# Patient Record
Sex: Female | Born: 1935 | Race: Black or African American | Hispanic: No | Smoking: Former smoker
Health system: Southern US, Community
[De-identification: ages and names within clinical notes are randomized; demographics above are authoritative.]

## PROBLEM LIST (undated history)

## (undated) DIAGNOSIS — F32A Depression, unspecified: Secondary | ICD-10-CM

## (undated) DIAGNOSIS — I69391 Dysphagia following cerebral infarction: Secondary | ICD-10-CM

## (undated) DIAGNOSIS — N189 Chronic kidney disease, unspecified: Secondary | ICD-10-CM

## (undated) DIAGNOSIS — M87059 Idiopathic aseptic necrosis of unspecified femur: Secondary | ICD-10-CM

## (undated) DIAGNOSIS — I10 Essential (primary) hypertension: Secondary | ICD-10-CM

## (undated) DIAGNOSIS — Z5189 Encounter for other specified aftercare: Secondary | ICD-10-CM

## (undated) DIAGNOSIS — I2510 Atherosclerotic heart disease of native coronary artery without angina pectoris: Secondary | ICD-10-CM

## (undated) DIAGNOSIS — Z950 Presence of cardiac pacemaker: Secondary | ICD-10-CM

## (undated) DIAGNOSIS — Z8679 Personal history of other diseases of the circulatory system: Secondary | ICD-10-CM

## (undated) DIAGNOSIS — K219 Gastro-esophageal reflux disease without esophagitis: Secondary | ICD-10-CM

## (undated) DIAGNOSIS — I517 Cardiomegaly: Secondary | ICD-10-CM

## (undated) DIAGNOSIS — K754 Autoimmune hepatitis: Secondary | ICD-10-CM

## (undated) DIAGNOSIS — Z8709 Personal history of other diseases of the respiratory system: Secondary | ICD-10-CM

## (undated) DIAGNOSIS — J90 Pleural effusion, not elsewhere classified: Secondary | ICD-10-CM

## (undated) DIAGNOSIS — Z8719 Personal history of other diseases of the digestive system: Secondary | ICD-10-CM

## (undated) DIAGNOSIS — J159 Unspecified bacterial pneumonia: Secondary | ICD-10-CM

## (undated) DIAGNOSIS — J449 Chronic obstructive pulmonary disease, unspecified: Secondary | ICD-10-CM

## (undated) DIAGNOSIS — F419 Anxiety disorder, unspecified: Secondary | ICD-10-CM

## (undated) DIAGNOSIS — I4891 Unspecified atrial fibrillation: Principal | ICD-10-CM

## (undated) DIAGNOSIS — M1990 Unspecified osteoarthritis, unspecified site: Secondary | ICD-10-CM

## (undated) DIAGNOSIS — I209 Angina pectoris, unspecified: Secondary | ICD-10-CM

## (undated) DIAGNOSIS — I509 Heart failure, unspecified: Secondary | ICD-10-CM

## (undated) DIAGNOSIS — D649 Anemia, unspecified: Secondary | ICD-10-CM

## (undated) DIAGNOSIS — I639 Cerebral infarction, unspecified: Secondary | ICD-10-CM

## (undated) DIAGNOSIS — IMO0001 Reserved for inherently not codable concepts without codable children: Secondary | ICD-10-CM

## (undated) DIAGNOSIS — Z9911 Dependence on respirator [ventilator] status: Secondary | ICD-10-CM

## (undated) DIAGNOSIS — F329 Major depressive disorder, single episode, unspecified: Secondary | ICD-10-CM

## (undated) DIAGNOSIS — R0602 Shortness of breath: Secondary | ICD-10-CM

## (undated) DIAGNOSIS — N281 Cyst of kidney, acquired: Secondary | ICD-10-CM

## (undated) DIAGNOSIS — Z9889 Other specified postprocedural states: Secondary | ICD-10-CM

## (undated) DIAGNOSIS — I272 Pulmonary hypertension, unspecified: Secondary | ICD-10-CM

## (undated) DIAGNOSIS — E039 Hypothyroidism, unspecified: Secondary | ICD-10-CM

## (undated) DIAGNOSIS — E785 Hyperlipidemia, unspecified: Secondary | ICD-10-CM

## (undated) HISTORY — DX: Unspecified atrial fibrillation: I48.91

## (undated) HISTORY — PX: CARDIAC CATHETERIZATION: SHX172

## (undated) HISTORY — DX: Other specified postprocedural states: Z98.890

## (undated) HISTORY — PX: OTHER SURGICAL HISTORY: SHX169

## (undated) HISTORY — PX: AV NODE ABLATION: SHX1209

---

## 1959-06-15 HISTORY — PX: ABDOMINAL HYSTERECTOMY: SHX81

## 1998-10-13 ENCOUNTER — Ambulatory Visit (HOSPITAL_COMMUNITY): Admission: RE | Admit: 1998-10-13 | Discharge: 1998-10-13 | Payer: Self-pay | Admitting: Emergency Medicine

## 1998-10-13 ENCOUNTER — Encounter: Payer: Self-pay | Admitting: Emergency Medicine

## 1998-10-24 ENCOUNTER — Inpatient Hospital Stay (HOSPITAL_COMMUNITY): Admission: AD | Admit: 1998-10-24 | Discharge: 1998-10-27 | Payer: Self-pay | Admitting: *Deleted

## 1998-10-25 ENCOUNTER — Encounter: Payer: Self-pay | Admitting: *Deleted

## 1998-11-16 ENCOUNTER — Ambulatory Visit (HOSPITAL_COMMUNITY): Admission: RE | Admit: 1998-11-16 | Discharge: 1998-11-16 | Payer: Self-pay | Admitting: *Deleted

## 1998-12-07 ENCOUNTER — Encounter: Payer: Self-pay | Admitting: Cardiovascular Disease

## 1998-12-07 ENCOUNTER — Inpatient Hospital Stay (HOSPITAL_COMMUNITY): Admission: EM | Admit: 1998-12-07 | Discharge: 1998-12-18 | Payer: Self-pay | Admitting: Emergency Medicine

## 1999-03-13 ENCOUNTER — Encounter: Payer: Self-pay | Admitting: Emergency Medicine

## 1999-03-13 ENCOUNTER — Ambulatory Visit (HOSPITAL_COMMUNITY): Admission: RE | Admit: 1999-03-13 | Discharge: 1999-03-13 | Payer: Self-pay | Admitting: Emergency Medicine

## 1999-07-12 ENCOUNTER — Inpatient Hospital Stay (HOSPITAL_COMMUNITY): Admission: EM | Admit: 1999-07-12 | Discharge: 1999-07-14 | Payer: Self-pay | Admitting: Emergency Medicine

## 1999-07-12 ENCOUNTER — Encounter: Payer: Self-pay | Admitting: Cardiovascular Disease

## 1999-07-12 DIAGNOSIS — K754 Autoimmune hepatitis: Secondary | ICD-10-CM

## 1999-07-12 HISTORY — DX: Autoimmune hepatitis: K75.4

## 1999-07-13 ENCOUNTER — Encounter: Payer: Self-pay | Admitting: Cardiovascular Disease

## 1999-08-09 ENCOUNTER — Inpatient Hospital Stay (HOSPITAL_COMMUNITY): Admission: EM | Admit: 1999-08-09 | Discharge: 1999-09-11 | Payer: Self-pay | Admitting: Emergency Medicine

## 1999-08-13 ENCOUNTER — Encounter: Payer: Self-pay | Admitting: Cardiovascular Disease

## 1999-08-19 ENCOUNTER — Encounter: Payer: Self-pay | Admitting: Cardiothoracic Surgery

## 1999-08-20 ENCOUNTER — Encounter: Payer: Self-pay | Admitting: Cardiothoracic Surgery

## 1999-08-20 ENCOUNTER — Encounter: Payer: Self-pay | Admitting: Thoracic Surgery (Cardiothoracic Vascular Surgery)

## 1999-08-20 HISTORY — PX: CARDIAC VALVE REPLACEMENT: SHX585

## 1999-08-21 ENCOUNTER — Encounter: Payer: Self-pay | Admitting: Cardiothoracic Surgery

## 1999-08-22 ENCOUNTER — Encounter: Payer: Self-pay | Admitting: Cardiothoracic Surgery

## 1999-08-23 ENCOUNTER — Encounter: Payer: Self-pay | Admitting: Cardiothoracic Surgery

## 1999-08-24 ENCOUNTER — Encounter: Payer: Self-pay | Admitting: General Surgery

## 1999-08-25 ENCOUNTER — Encounter: Payer: Self-pay | Admitting: Cardiothoracic Surgery

## 1999-08-26 ENCOUNTER — Encounter: Payer: Self-pay | Admitting: Cardiothoracic Surgery

## 1999-08-27 ENCOUNTER — Encounter: Payer: Self-pay | Admitting: Cardiothoracic Surgery

## 1999-08-28 ENCOUNTER — Encounter: Payer: Self-pay | Admitting: Cardiothoracic Surgery

## 1999-08-29 ENCOUNTER — Encounter: Payer: Self-pay | Admitting: Cardiothoracic Surgery

## 1999-08-30 ENCOUNTER — Encounter: Payer: Self-pay | Admitting: Cardiothoracic Surgery

## 1999-08-31 ENCOUNTER — Encounter: Payer: Self-pay | Admitting: Cardiothoracic Surgery

## 2000-03-13 ENCOUNTER — Ambulatory Visit (HOSPITAL_COMMUNITY): Admission: RE | Admit: 2000-03-13 | Discharge: 2000-03-13 | Payer: Self-pay | Admitting: Emergency Medicine

## 2000-03-13 ENCOUNTER — Encounter: Payer: Self-pay | Admitting: Emergency Medicine

## 2000-09-04 ENCOUNTER — Encounter: Payer: Self-pay | Admitting: Emergency Medicine

## 2000-09-04 ENCOUNTER — Inpatient Hospital Stay (HOSPITAL_COMMUNITY): Admission: EM | Admit: 2000-09-04 | Discharge: 2000-09-20 | Payer: Self-pay | Admitting: Emergency Medicine

## 2000-09-05 ENCOUNTER — Encounter: Payer: Self-pay | Admitting: Cardiology

## 2000-09-07 ENCOUNTER — Encounter: Payer: Self-pay | Admitting: Cardiology

## 2000-09-10 ENCOUNTER — Encounter: Payer: Self-pay | Admitting: Cardiology

## 2000-09-11 HISTORY — PX: INSERT / REPLACE / REMOVE PACEMAKER: SUR710

## 2000-09-12 ENCOUNTER — Encounter: Payer: Self-pay | Admitting: Cardiology

## 2000-11-05 ENCOUNTER — Ambulatory Visit (HOSPITAL_COMMUNITY): Admission: RE | Admit: 2000-11-05 | Discharge: 2000-11-05 | Payer: Self-pay | Admitting: Hematology and Oncology

## 2000-11-05 ENCOUNTER — Encounter (INDEPENDENT_AMBULATORY_CARE_PROVIDER_SITE_OTHER): Payer: Self-pay

## 2000-12-24 ENCOUNTER — Inpatient Hospital Stay (HOSPITAL_COMMUNITY): Admission: RE | Admit: 2000-12-24 | Discharge: 2000-12-31 | Payer: Self-pay | Admitting: Orthopedic Surgery

## 2000-12-25 ENCOUNTER — Encounter: Payer: Self-pay | Admitting: Orthopedic Surgery

## 2000-12-26 HISTORY — PX: KNEE ARTHROSCOPY: SHX127

## 2000-12-27 ENCOUNTER — Encounter: Payer: Self-pay | Admitting: Orthopedic Surgery

## 2001-03-02 ENCOUNTER — Inpatient Hospital Stay (HOSPITAL_COMMUNITY): Admission: RE | Admit: 2001-03-02 | Discharge: 2001-03-12 | Payer: Self-pay | Admitting: Cardiovascular Disease

## 2001-03-04 HISTORY — PX: OTHER SURGICAL HISTORY: SHX169

## 2001-03-06 ENCOUNTER — Encounter: Payer: Self-pay | Admitting: Cardiology

## 2001-03-07 ENCOUNTER — Encounter: Payer: Self-pay | Admitting: Orthopedic Surgery

## 2001-03-18 ENCOUNTER — Encounter: Admission: RE | Admit: 2001-03-18 | Discharge: 2001-03-31 | Payer: Self-pay | Admitting: Neurology

## 2001-03-27 ENCOUNTER — Ambulatory Visit (HOSPITAL_COMMUNITY): Admission: RE | Admit: 2001-03-27 | Discharge: 2001-03-27 | Payer: Self-pay | Admitting: Emergency Medicine

## 2001-03-27 ENCOUNTER — Encounter: Payer: Self-pay | Admitting: Emergency Medicine

## 2001-07-27 ENCOUNTER — Inpatient Hospital Stay (HOSPITAL_COMMUNITY): Admission: RE | Admit: 2001-07-27 | Discharge: 2001-08-03 | Payer: Self-pay | Admitting: Orthopedic Surgery

## 2001-07-29 HISTORY — PX: KNEE ARTHROSCOPY: SHX127

## 2001-08-28 ENCOUNTER — Encounter: Admission: RE | Admit: 2001-08-28 | Discharge: 2001-11-26 | Payer: Self-pay | Admitting: Orthopedic Surgery

## 2002-01-06 DIAGNOSIS — I639 Cerebral infarction, unspecified: Secondary | ICD-10-CM

## 2002-01-06 HISTORY — DX: Cerebral infarction, unspecified: I63.9

## 2002-01-22 ENCOUNTER — Ambulatory Visit (HOSPITAL_COMMUNITY): Admission: RE | Admit: 2002-01-22 | Discharge: 2002-01-22 | Payer: Self-pay | Admitting: Neurology

## 2002-01-22 ENCOUNTER — Encounter: Payer: Self-pay | Admitting: Neurology

## 2002-03-31 ENCOUNTER — Encounter: Payer: Self-pay | Admitting: Emergency Medicine

## 2002-03-31 ENCOUNTER — Ambulatory Visit (HOSPITAL_COMMUNITY): Admission: RE | Admit: 2002-03-31 | Discharge: 2002-03-31 | Payer: Self-pay | Admitting: Emergency Medicine

## 2003-04-05 ENCOUNTER — Encounter: Payer: Self-pay | Admitting: Emergency Medicine

## 2003-04-05 ENCOUNTER — Ambulatory Visit (HOSPITAL_COMMUNITY): Admission: RE | Admit: 2003-04-05 | Discharge: 2003-04-05 | Payer: Self-pay | Admitting: Emergency Medicine

## 2003-06-22 ENCOUNTER — Encounter (HOSPITAL_COMMUNITY): Admission: RE | Admit: 2003-06-22 | Discharge: 2003-06-22 | Payer: Self-pay | Admitting: Oncology

## 2003-08-22 ENCOUNTER — Emergency Department (HOSPITAL_COMMUNITY): Admission: EM | Admit: 2003-08-22 | Discharge: 2003-08-22 | Payer: Self-pay

## 2003-12-22 ENCOUNTER — Inpatient Hospital Stay (HOSPITAL_COMMUNITY): Admission: EM | Admit: 2003-12-22 | Discharge: 2004-01-04 | Payer: Self-pay | Admitting: Emergency Medicine

## 2003-12-22 DIAGNOSIS — I209 Angina pectoris, unspecified: Secondary | ICD-10-CM

## 2003-12-22 HISTORY — DX: Angina pectoris, unspecified: I20.9

## 2004-02-09 ENCOUNTER — Inpatient Hospital Stay (HOSPITAL_COMMUNITY): Admission: RE | Admit: 2004-02-09 | Discharge: 2004-02-13 | Payer: Self-pay | Admitting: Family Medicine

## 2004-02-14 ENCOUNTER — Encounter: Admission: RE | Admit: 2004-02-14 | Discharge: 2004-02-14 | Payer: Self-pay | Admitting: Family Medicine

## 2005-02-07 ENCOUNTER — Ambulatory Visit (HOSPITAL_COMMUNITY): Admission: RE | Admit: 2005-02-07 | Discharge: 2005-02-07 | Payer: Self-pay | Admitting: *Deleted

## 2005-02-17 ENCOUNTER — Emergency Department (HOSPITAL_COMMUNITY): Admission: EM | Admit: 2005-02-17 | Discharge: 2005-02-17 | Payer: Self-pay | Admitting: Emergency Medicine

## 2005-04-04 ENCOUNTER — Inpatient Hospital Stay (HOSPITAL_COMMUNITY): Admission: EM | Admit: 2005-04-04 | Discharge: 2005-04-05 | Payer: Self-pay | Admitting: Emergency Medicine

## 2005-06-05 ENCOUNTER — Inpatient Hospital Stay (HOSPITAL_COMMUNITY): Admission: AD | Admit: 2005-06-05 | Discharge: 2005-06-13 | Payer: Self-pay | Admitting: *Deleted

## 2005-11-06 ENCOUNTER — Emergency Department (HOSPITAL_COMMUNITY): Admission: EM | Admit: 2005-11-06 | Discharge: 2005-11-06 | Payer: Self-pay | Admitting: Emergency Medicine

## 2005-11-28 HISTORY — PX: OTHER SURGICAL HISTORY: SHX169

## 2005-12-03 ENCOUNTER — Ambulatory Visit (HOSPITAL_COMMUNITY): Admission: RE | Admit: 2005-12-03 | Discharge: 2005-12-03 | Payer: Self-pay | Admitting: Neurology

## 2006-05-23 ENCOUNTER — Inpatient Hospital Stay (HOSPITAL_COMMUNITY): Admission: EM | Admit: 2006-05-23 | Discharge: 2006-06-05 | Payer: Self-pay | Admitting: Emergency Medicine

## 2006-05-25 ENCOUNTER — Encounter (INDEPENDENT_AMBULATORY_CARE_PROVIDER_SITE_OTHER): Payer: Self-pay | Admitting: *Deleted

## 2007-05-06 ENCOUNTER — Ambulatory Visit: Payer: Self-pay | Admitting: Oncology

## 2007-05-25 LAB — CBC & DIFF AND RETIC
BASO%: 0 % (ref 0.0–2.0)
EOS%: 1.8 % (ref 0.0–7.0)
HCT: 27.1 % — ABNORMAL LOW (ref 34.8–46.6)
LYMPH%: 17.3 % (ref 14.0–48.0)
MCH: 28.6 pg (ref 26.0–34.0)
MCHC: 34 g/dL (ref 32.0–36.0)
MCV: 84.4 fL (ref 81.0–101.0)
MONO%: 13.6 % — ABNORMAL HIGH (ref 0.0–13.0)
NEUT%: 67.3 % (ref 39.6–76.8)
Platelets: 232 10*3/uL (ref 145–400)
lymph#: 0.4 10*3/uL — ABNORMAL LOW (ref 0.9–3.3)

## 2007-05-25 LAB — MORPHOLOGY

## 2007-05-25 LAB — CHCC SMEAR

## 2007-05-28 LAB — COMPREHENSIVE METABOLIC PANEL
BUN: 25 mg/dL — ABNORMAL HIGH (ref 6–23)
CO2: 24 mEq/L (ref 19–32)
Calcium: 10.2 mg/dL (ref 8.4–10.5)
Chloride: 107 mEq/L (ref 96–112)
Creatinine, Ser: 1.56 mg/dL — ABNORMAL HIGH (ref 0.40–1.20)
Glucose, Bld: 84 mg/dL (ref 70–99)
Total Bilirubin: 1 mg/dL (ref 0.3–1.2)

## 2007-05-28 LAB — KAPPA/LAMBDA LIGHT CHAINS
Kappa:Lambda Ratio: 1.6 (ref 0.26–1.65)
Lambda Free Lght Chn: 1.88 mg/dL (ref 0.57–2.63)

## 2007-05-28 LAB — IMMUNOFIXATION ELECTROPHORESIS
IgG (Immunoglobin G), Serum: 1930 mg/dL — ABNORMAL HIGH (ref 694–1618)
IgM, Serum: 111 mg/dL (ref 60–263)
Total Protein, Serum Electrophoresis: 8.3 g/dL (ref 6.0–8.3)

## 2007-05-28 LAB — FERRITIN: Ferritin: 12 ng/mL (ref 10–291)

## 2007-05-28 LAB — BETA 2 MICROGLOBULIN, SERUM: Beta-2 Microglobulin: 2.39 mg/L — ABNORMAL HIGH (ref 1.01–1.73)

## 2007-05-28 LAB — LACTATE DEHYDROGENASE: LDH: 448 U/L — ABNORMAL HIGH (ref 94–250)

## 2007-06-16 LAB — CBC WITH DIFFERENTIAL/PLATELET
BASO%: 1.4 % (ref 0.0–2.0)
Basophils Absolute: 0 10*3/uL (ref 0.0–0.1)
EOS%: 1.9 % (ref 0.0–7.0)
HCT: 32.1 % — ABNORMAL LOW (ref 34.8–46.6)
HGB: 10 g/dL — ABNORMAL LOW (ref 11.6–15.9)
MCH: 28.1 pg (ref 26.0–34.0)
MCHC: 31.1 g/dL — ABNORMAL LOW (ref 32.0–36.0)
MCV: 90.3 fL (ref 81.0–101.0)
MONO%: 10.2 % (ref 0.0–13.0)
NEUT%: 69.7 % (ref 39.6–76.8)
lymph#: 0.5 10*3/uL — ABNORMAL LOW (ref 0.9–3.3)

## 2007-06-16 LAB — MORPHOLOGY: PLT EST: ADEQUATE

## 2007-06-18 LAB — COMPREHENSIVE METABOLIC PANEL
ALT: 11 U/L (ref 0–35)
Albumin: 4.4 g/dL (ref 3.5–5.2)
CO2: 26 mEq/L (ref 19–32)
Calcium: 10 mg/dL (ref 8.4–10.5)
Chloride: 105 mEq/L (ref 96–112)
Glucose, Bld: 84 mg/dL (ref 70–99)
Potassium: 4.3 mEq/L (ref 3.5–5.3)
Sodium: 141 mEq/L (ref 135–145)
Total Protein: 8.1 g/dL (ref 6.0–8.3)

## 2007-06-18 LAB — KAPPA/LAMBDA LIGHT CHAINS
Kappa free light chain: 1.55 mg/dL (ref 0.33–1.94)
Kappa:Lambda Ratio: 1.02 (ref 0.26–1.65)
Lambda Free Lght Chn: 1.52 mg/dL (ref 0.57–2.63)

## 2007-06-18 LAB — FERRITIN: Ferritin: 339 ng/mL — ABNORMAL HIGH (ref 10–291)

## 2007-06-18 LAB — IMMUNOFIXATION ELECTROPHORESIS
IgM, Serum: 110 mg/dL (ref 60–263)
Total Protein, Serum Electrophoresis: 8.1 g/dL (ref 6.0–8.3)

## 2007-06-22 ENCOUNTER — Ambulatory Visit: Payer: Self-pay | Admitting: Oncology

## 2007-12-11 ENCOUNTER — Ambulatory Visit: Payer: Self-pay | Admitting: Family Medicine

## 2007-12-11 ENCOUNTER — Inpatient Hospital Stay (HOSPITAL_COMMUNITY): Admission: EM | Admit: 2007-12-11 | Discharge: 2007-12-16 | Payer: Self-pay | Admitting: Emergency Medicine

## 2007-12-15 ENCOUNTER — Ambulatory Visit: Payer: Self-pay | Admitting: Oncology

## 2008-01-03 ENCOUNTER — Ambulatory Visit: Payer: Self-pay | Admitting: Family Medicine

## 2008-01-03 ENCOUNTER — Inpatient Hospital Stay (HOSPITAL_COMMUNITY): Admission: EM | Admit: 2008-01-03 | Discharge: 2008-01-08 | Payer: Self-pay | Admitting: Emergency Medicine

## 2008-01-15 ENCOUNTER — Ambulatory Visit (HOSPITAL_COMMUNITY): Admission: RE | Admit: 2008-01-15 | Discharge: 2008-01-15 | Payer: Self-pay | Admitting: Gastroenterology

## 2008-01-28 ENCOUNTER — Ambulatory Visit: Payer: Self-pay | Admitting: Oncology

## 2008-02-02 ENCOUNTER — Inpatient Hospital Stay (HOSPITAL_COMMUNITY): Admission: EM | Admit: 2008-02-02 | Discharge: 2008-02-03 | Payer: Self-pay | Admitting: Emergency Medicine

## 2008-04-05 ENCOUNTER — Ambulatory Visit: Payer: Self-pay | Admitting: Oncology

## 2008-04-07 LAB — FERRITIN: Ferritin: 40 ng/mL (ref 10–291)

## 2008-04-07 LAB — CBC & DIFF AND RETIC
Basophils Absolute: 0 10*3/uL (ref 0.0–0.1)
Eosinophils Absolute: 0.1 10*3/uL (ref 0.0–0.5)
HGB: 10.6 g/dL — ABNORMAL LOW (ref 11.6–15.9)
LYMPH%: 17 % (ref 14.0–48.0)
MCV: 91.8 fL (ref 81.0–101.0)
MONO%: 11.2 % (ref 0.0–13.0)
NEUT#: 2.1 10*3/uL (ref 1.5–6.5)
Platelets: 188 10*3/uL (ref 145–400)
RDW: 16.8 % — ABNORMAL HIGH (ref 11.3–14.5)
RETIC #: 82.2 10*3/uL (ref 19.7–115.1)

## 2008-07-27 ENCOUNTER — Ambulatory Visit: Payer: Self-pay | Admitting: Family Medicine

## 2008-07-27 ENCOUNTER — Inpatient Hospital Stay (HOSPITAL_COMMUNITY): Admission: AD | Admit: 2008-07-27 | Discharge: 2008-07-28 | Payer: Self-pay | Admitting: Family Medicine

## 2008-10-06 IMAGING — CR DG CHEST 2V
2 series · 2 of 2 positions shown · non-contrast
Comparison: none

CLINICAL DATA: Chest pain.  Sternal pain into both shoulders. 
 CHEST ? 2 VIEW:

[w chest pa]
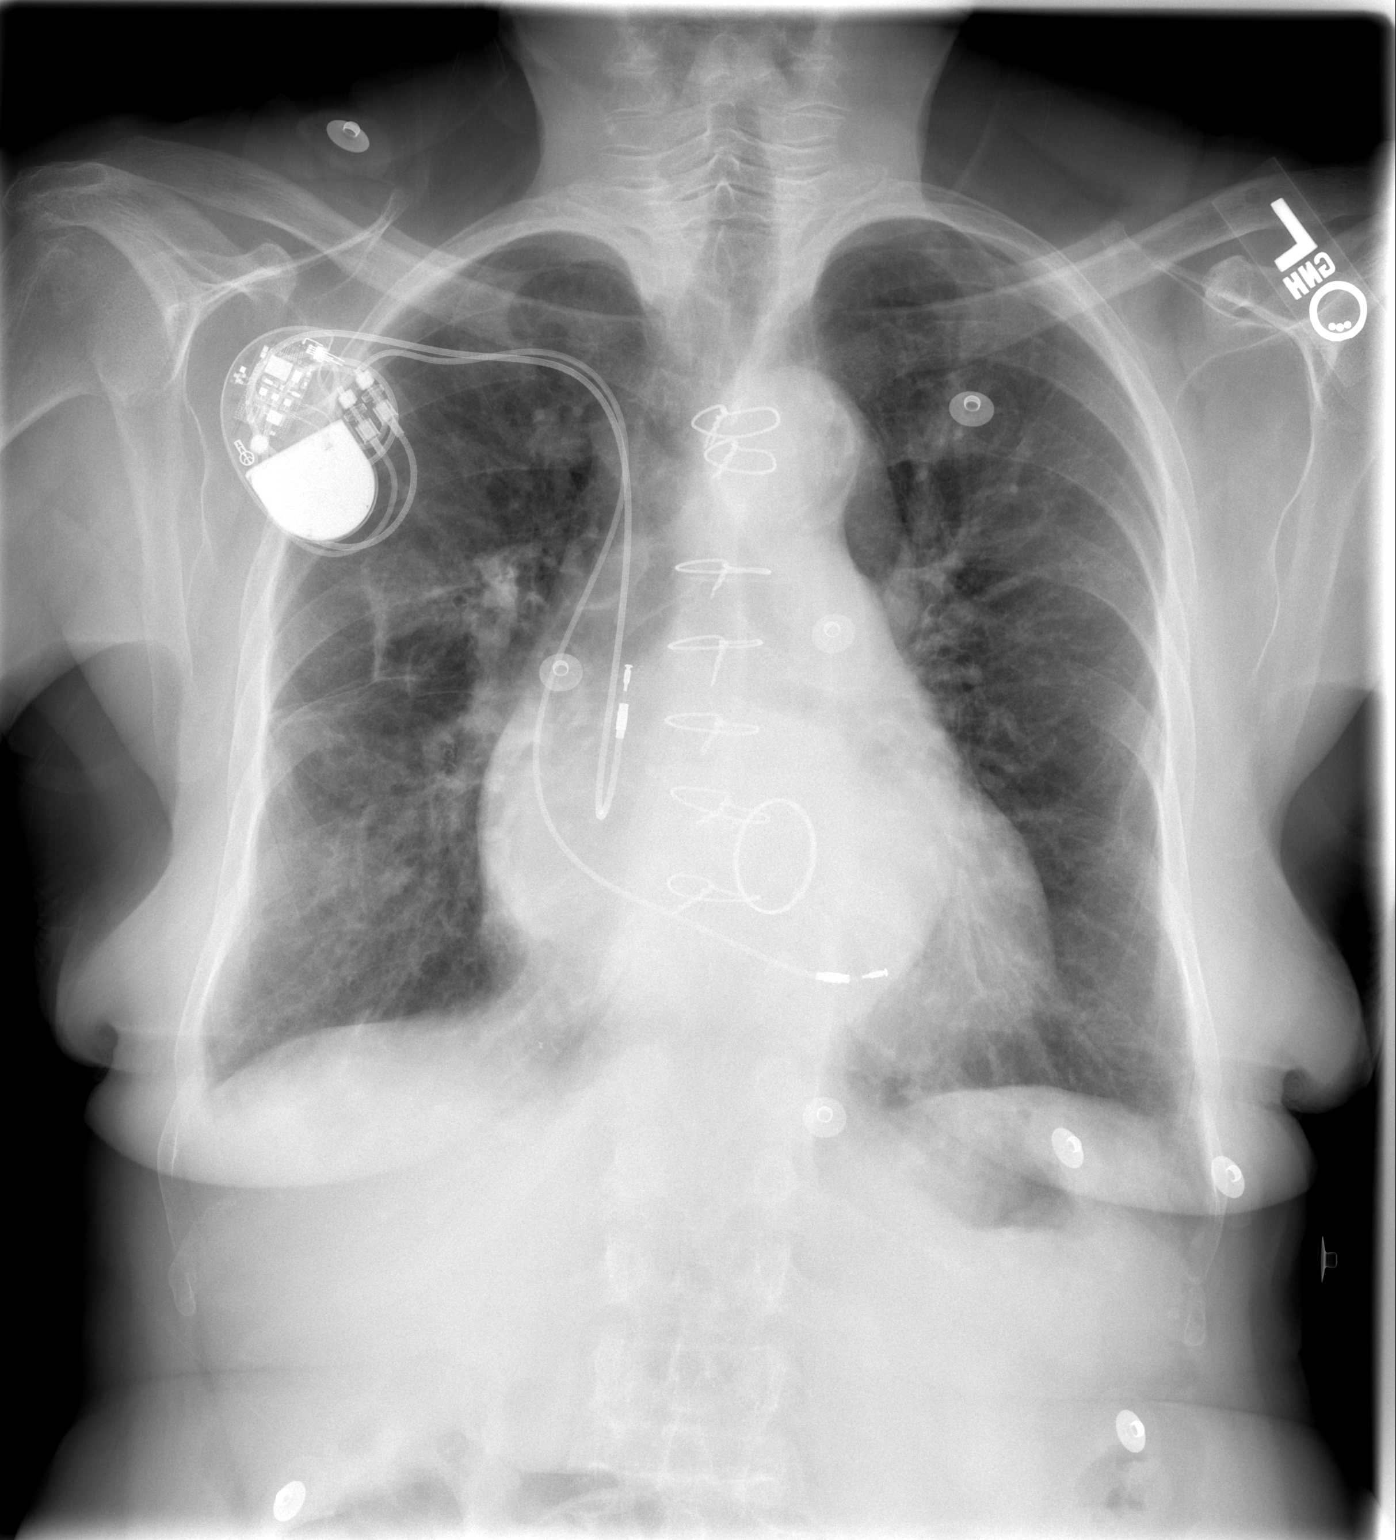

[w chest lat]
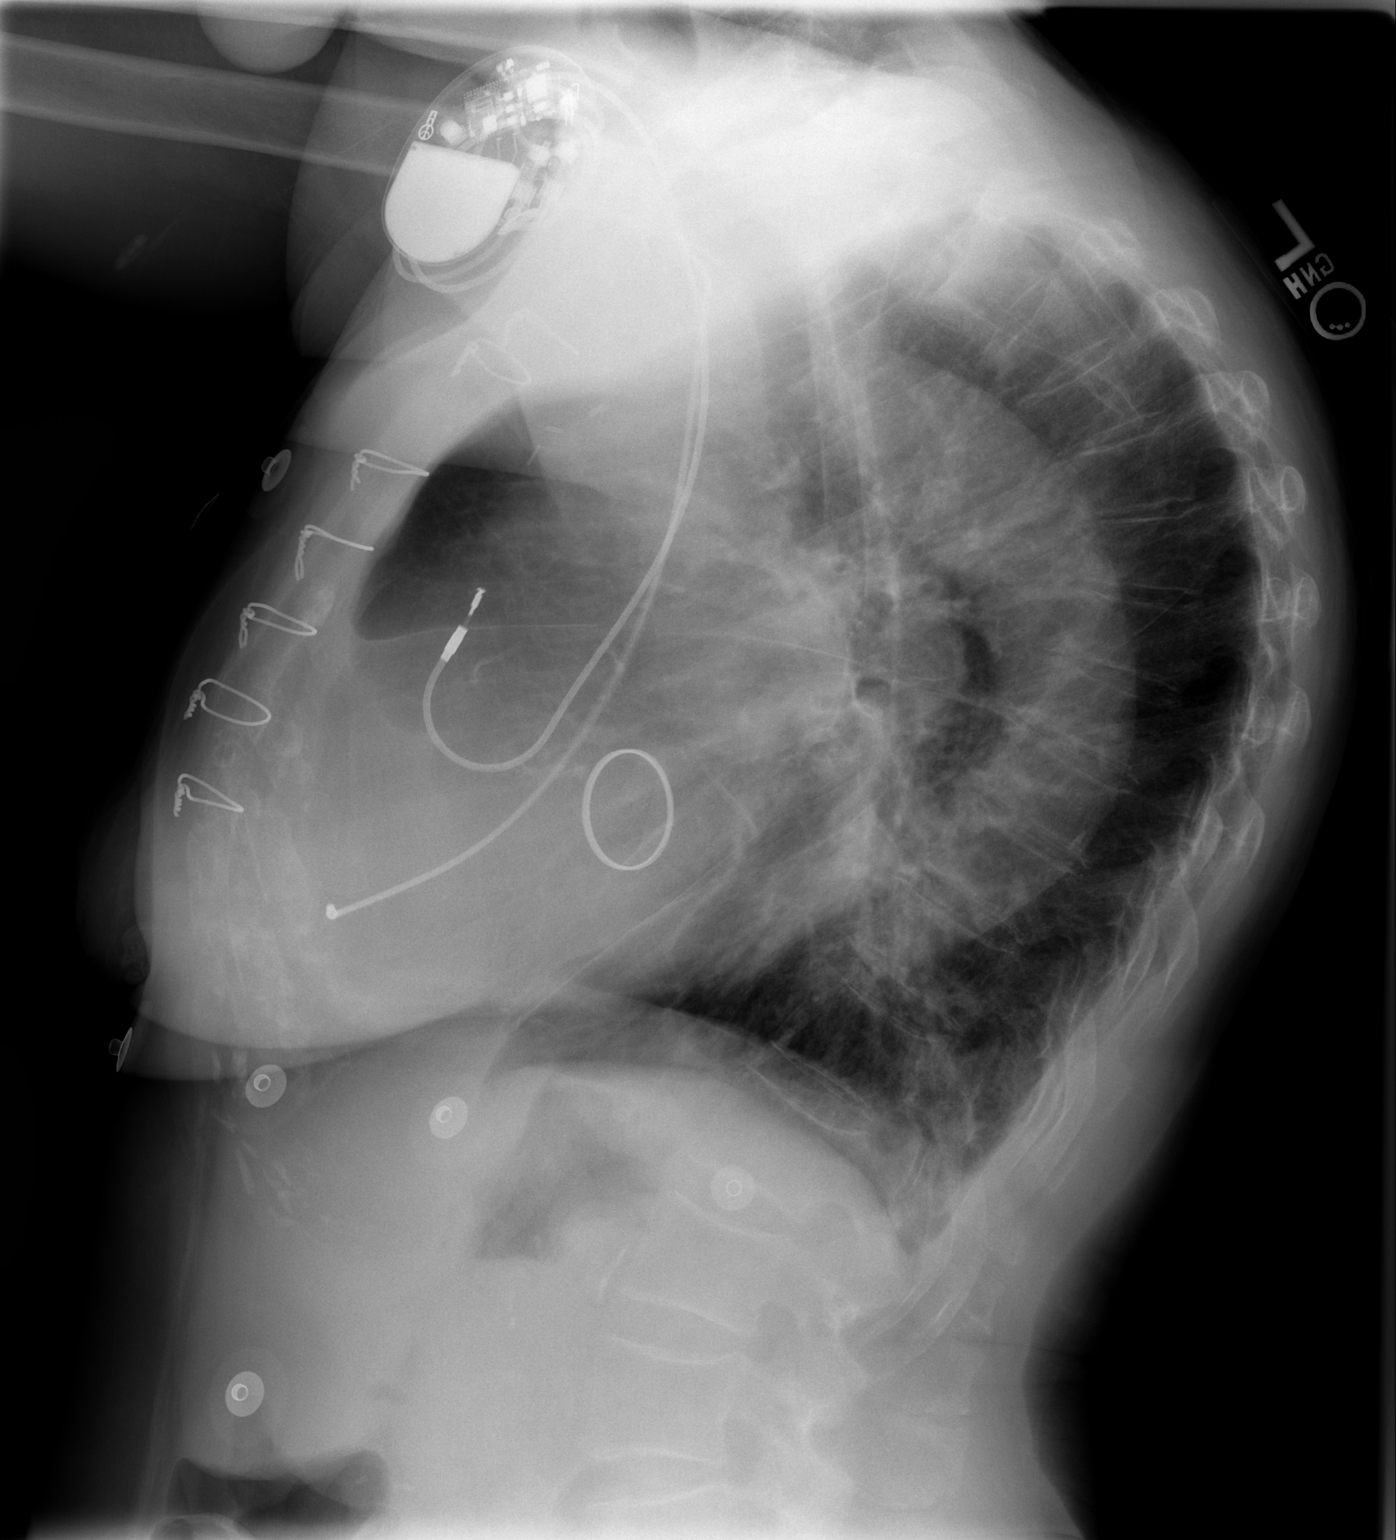

[2 of 2 positions shown; findings below may reference images not displayed]

FINDINGS: Right subclavian dual lead cardiac pacemaker is present unchanged.  Mitral valve annuloplasty ring.  COPD.  Thoracic kyphosis with mild wedging of midthoracic vertebral bodies unchanged.  No edema or effusion identified.  Aortic atherosclerosis and aortic arch ectasia.  Mild rightward deviation of the trachea due to enlargement of the aortic arch.
IMPRESSION: 1.  Cardiomegaly without definite evidence of failure. 
 2.  Unchanged postprocedural changes and support apparatus. 
 3.  Emphysema.

## 2008-10-09 IMAGING — CR DG SMALL BOWEL
1 series · 1 of 1 positions shown · non-contrast
Comparison: None

Fluoroscopy time:  0.7 minutes

CLINICAL DATA: OTHER-SEE COMMENT.  LEFT ABDOMINAL PAIN.

SMALL BOWEL SERIES
TECHNIQUE: Following ingestion of barium, serial small bowel
images were obtained including spot views of the terminal ileum

[t abdomen supine]
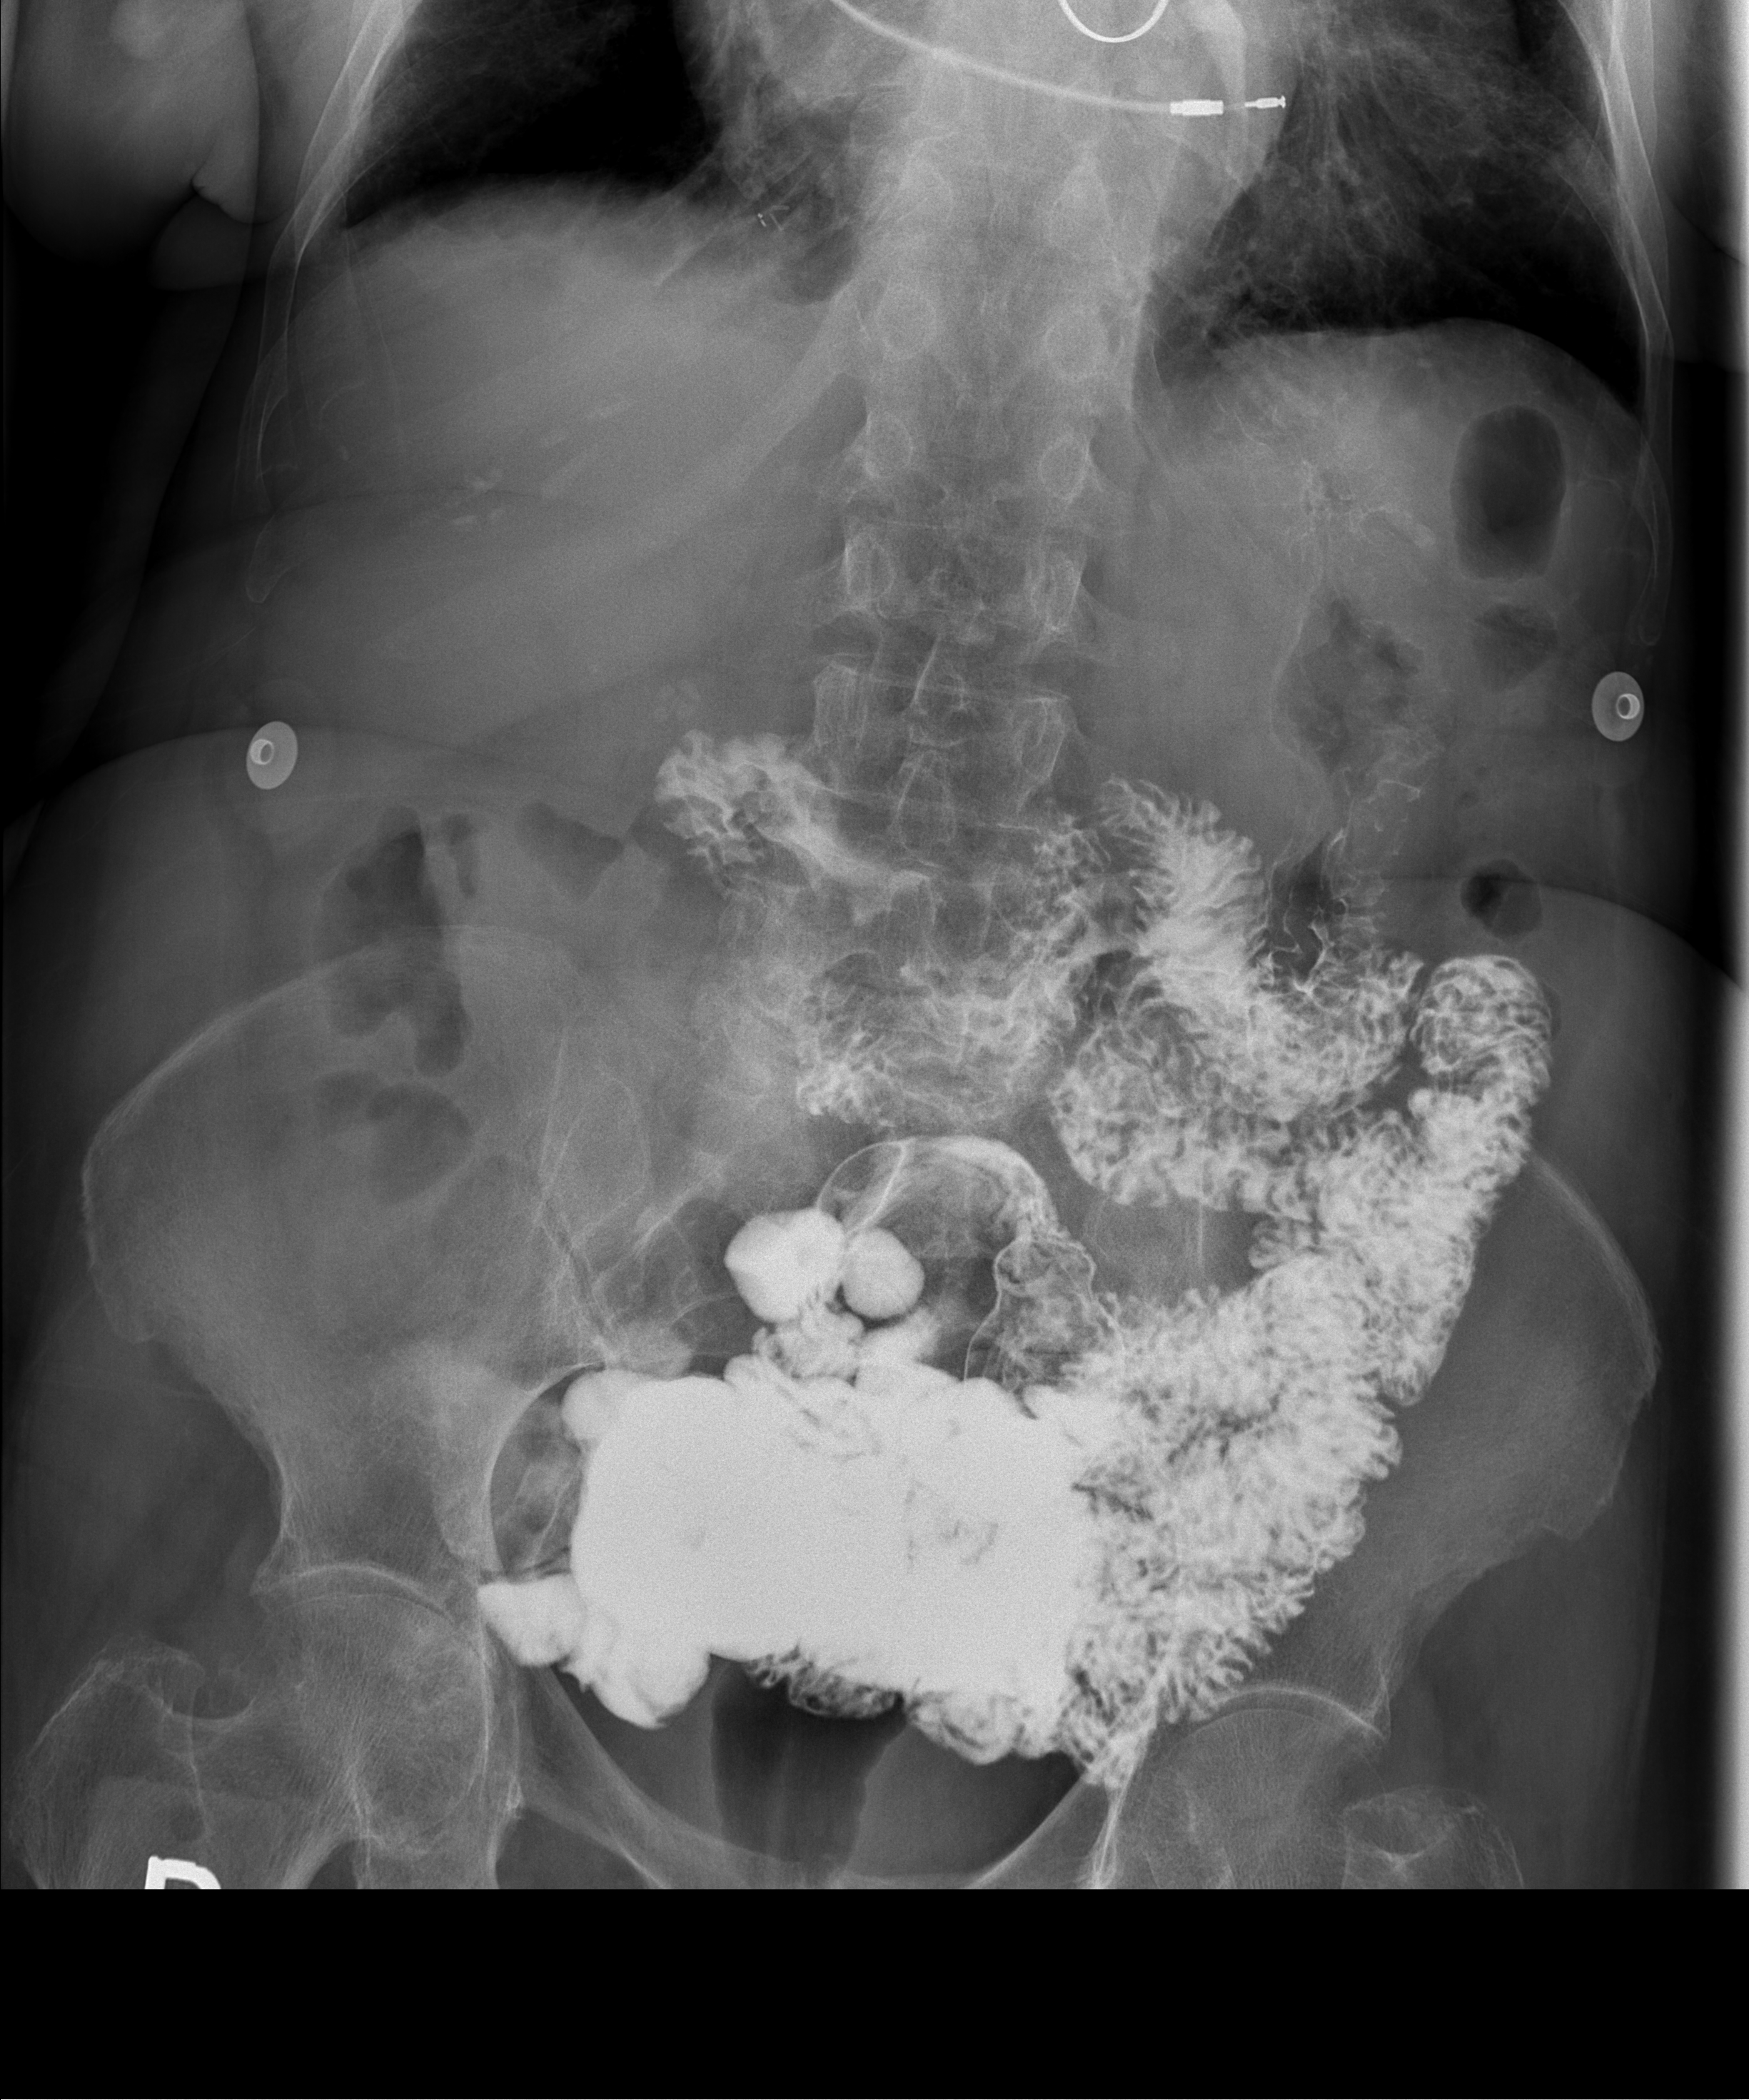

[1 of 1 positions shown; findings below may reference images not displayed]

FINDINGS: Scout view of the abdomen shows a normal bowel gas
pattern.  There is atherosclerotic calcification of the arterial
vasculature..  Degenerative changes are seen in the right hip.
Small bowel unremarkable.  No obstruction.  No definite fold
thickening.  Terminal ileum unremarkable.    Small bowel transit
time is approximate 2 hours and 40 minutes.
IMPRESSION: No acute findings.

## 2008-10-30 ENCOUNTER — Inpatient Hospital Stay (HOSPITAL_COMMUNITY): Admission: EM | Admit: 2008-10-30 | Discharge: 2008-10-31 | Payer: Self-pay | Admitting: Family Medicine

## 2008-10-31 ENCOUNTER — Ambulatory Visit: Payer: Self-pay | Admitting: Family Medicine

## 2008-11-06 ENCOUNTER — Inpatient Hospital Stay (HOSPITAL_COMMUNITY): Admission: EM | Admit: 2008-11-06 | Discharge: 2008-11-08 | Payer: Self-pay | Admitting: Emergency Medicine

## 2008-11-06 ENCOUNTER — Ambulatory Visit: Payer: Self-pay | Admitting: Family Medicine

## 2008-12-01 ENCOUNTER — Ambulatory Visit: Payer: Self-pay | Admitting: Family Medicine

## 2008-12-01 ENCOUNTER — Inpatient Hospital Stay (HOSPITAL_COMMUNITY): Admission: EM | Admit: 2008-12-01 | Discharge: 2008-12-07 | Payer: Self-pay | Admitting: Emergency Medicine

## 2008-12-26 ENCOUNTER — Ambulatory Visit: Payer: Self-pay | Admitting: Oncology

## 2008-12-28 LAB — CBC & DIFF AND RETIC
Basophils Absolute: 0 10*3/uL (ref 0.0–0.1)
EOS%: 3 % (ref 0.0–7.0)
HCT: 32.9 % — ABNORMAL LOW (ref 34.8–46.6)
HGB: 10.8 g/dL — ABNORMAL LOW (ref 11.6–15.9)
MCH: 26.9 pg (ref 25.1–34.0)
MCV: 82.1 fL (ref 79.5–101.0)
MONO%: 9.7 % (ref 0.0–14.0)
NEUT%: 77.4 % — ABNORMAL HIGH (ref 38.4–76.8)

## 2009-02-18 ENCOUNTER — Inpatient Hospital Stay (HOSPITAL_COMMUNITY): Admission: EM | Admit: 2009-02-18 | Discharge: 2009-02-19 | Payer: Self-pay | Admitting: Emergency Medicine

## 2009-02-18 ENCOUNTER — Ambulatory Visit: Payer: Self-pay | Admitting: Family Medicine

## 2009-05-30 ENCOUNTER — Ambulatory Visit: Payer: Self-pay | Admitting: Pulmonary Disease

## 2009-05-30 ENCOUNTER — Ambulatory Visit: Payer: Self-pay | Admitting: Surgery

## 2009-05-30 ENCOUNTER — Inpatient Hospital Stay (HOSPITAL_COMMUNITY): Admission: AD | Admit: 2009-05-30 | Discharge: 2009-06-20 | Payer: Self-pay | Admitting: Cardiovascular Disease

## 2009-06-04 ENCOUNTER — Encounter: Payer: Self-pay | Admitting: Surgery

## 2009-06-05 ENCOUNTER — Encounter: Payer: Self-pay | Admitting: Surgery

## 2009-06-05 HISTORY — PX: CARDIAC VALVE REPLACEMENT: SHX585

## 2009-06-08 DIAGNOSIS — Z9911 Dependence on respirator [ventilator] status: Secondary | ICD-10-CM

## 2009-06-08 HISTORY — DX: Dependence on respirator (ventilator) status: Z99.11

## 2009-06-15 HISTORY — PX: PERCUTANEOUS TRACHEOSTOMY: SHX5288

## 2009-06-20 ENCOUNTER — Inpatient Hospital Stay: Admission: AD | Admit: 2009-06-20 | Discharge: 2009-07-21 | Payer: Self-pay | Admitting: Internal Medicine

## 2009-06-28 ENCOUNTER — Ambulatory Visit: Payer: Self-pay | Admitting: Critical Care Medicine

## 2009-10-31 ENCOUNTER — Encounter: Admission: RE | Admit: 2009-10-31 | Discharge: 2009-10-31 | Payer: Self-pay | Admitting: Neurology

## 2010-02-17 ENCOUNTER — Inpatient Hospital Stay (HOSPITAL_COMMUNITY): Admission: EM | Admit: 2010-02-17 | Discharge: 2010-02-19 | Payer: Self-pay | Admitting: Emergency Medicine

## 2010-02-19 ENCOUNTER — Encounter (INDEPENDENT_AMBULATORY_CARE_PROVIDER_SITE_OTHER): Payer: Self-pay | Admitting: Internal Medicine

## 2010-02-20 ENCOUNTER — Emergency Department (HOSPITAL_COMMUNITY): Admission: EM | Admit: 2010-02-20 | Discharge: 2010-02-21 | Payer: Self-pay | Admitting: Emergency Medicine

## 2010-03-27 IMAGING — CR DG ABD PORTABLE 1V
1 series · 1 of 1 positions shown · non-contrast
Comparison: Portable exam 7700 hours compared to 06/22/2009

CLINICAL DATA: Ventilator, respiratory failure, feeding tube
placement

ABDOMEN - 1 VIEW

[view not recorded]
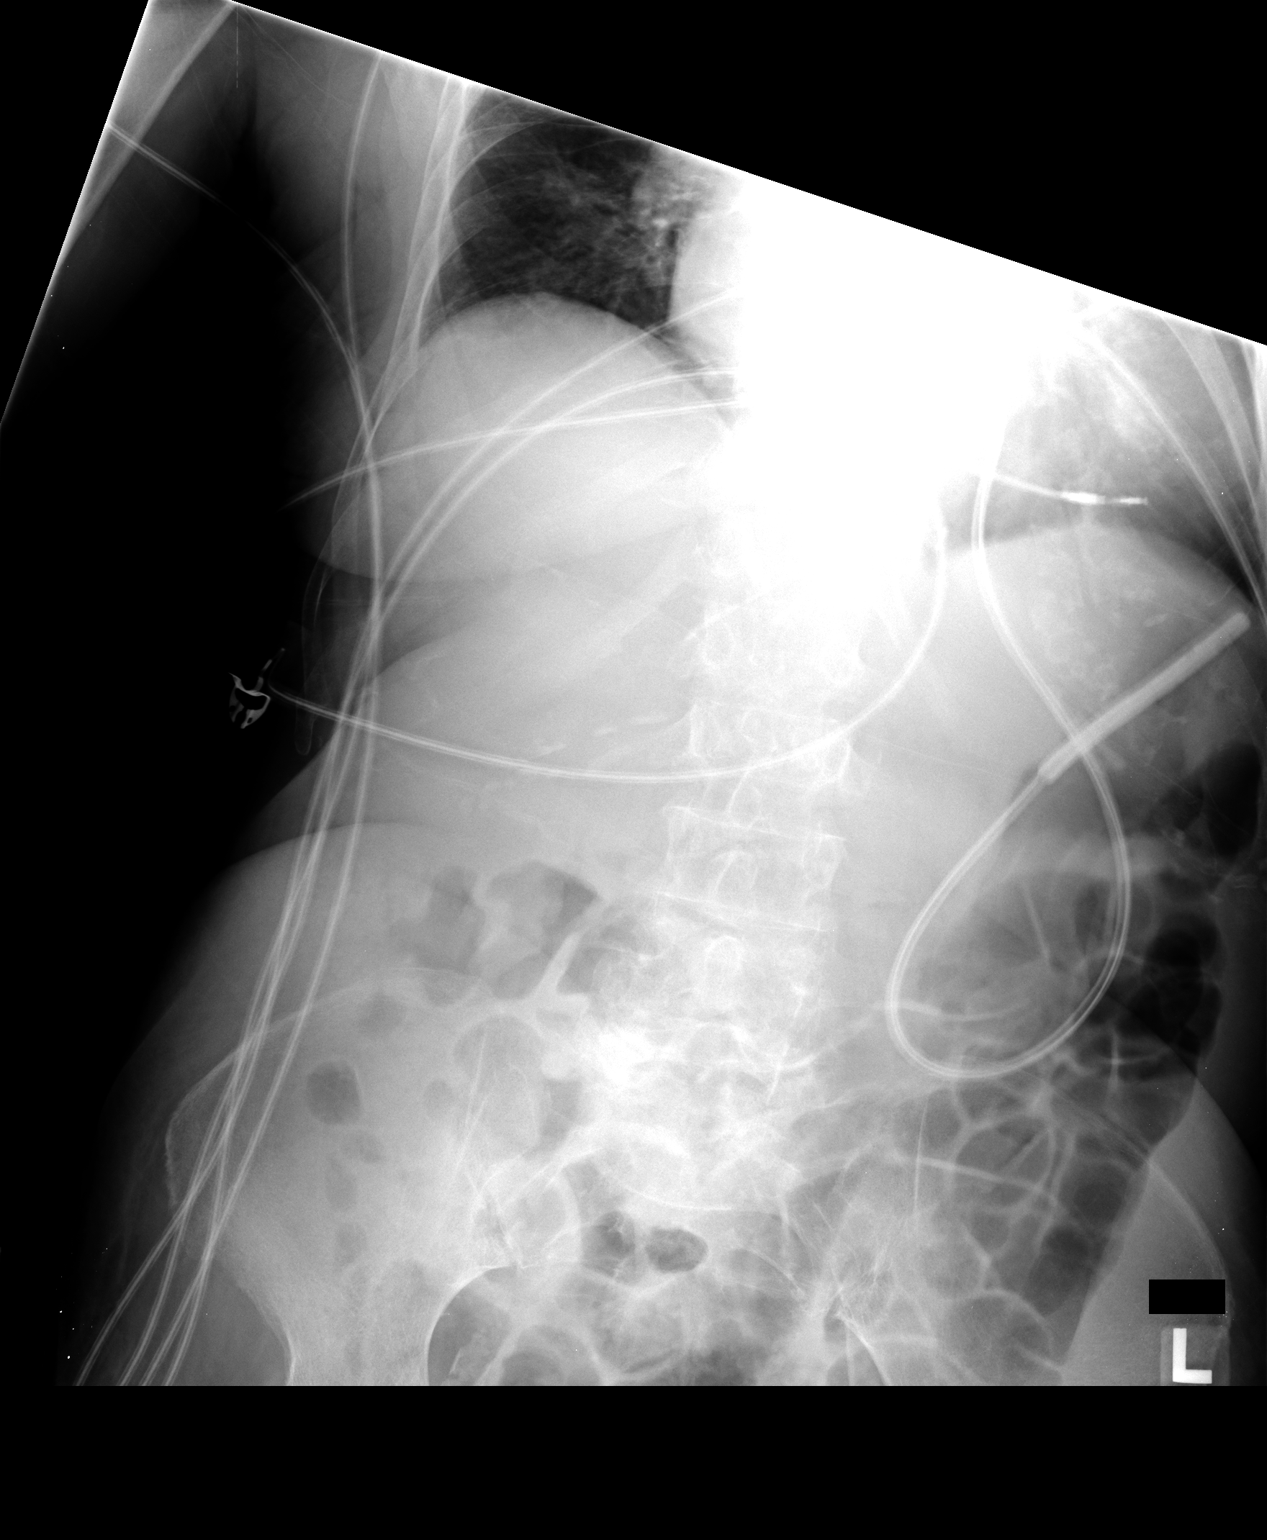

[1 of 1 positions shown; findings below may reference images not displayed]

FINDINGS: Feeding tube coiled in proximal stomach, tip at fundus.
Heart enlarged with evidence of prior median sternotomy and valve
replacement.
Bowel gas pattern normal.
Bones demineralized.
IMPRESSION: Feeding tube coiled in proximal stomach.

## 2010-03-28 IMAGING — CR DG ABD PORTABLE 1V
1 series · 1 of 1 positions shown · non-contrast
Comparison: Abdominal radiograph performed 06/23/2009

CLINICAL DATA: Panda tube placement.  Status post open heart
surgery.

ABDOMEN - 1 VIEW

[AP]
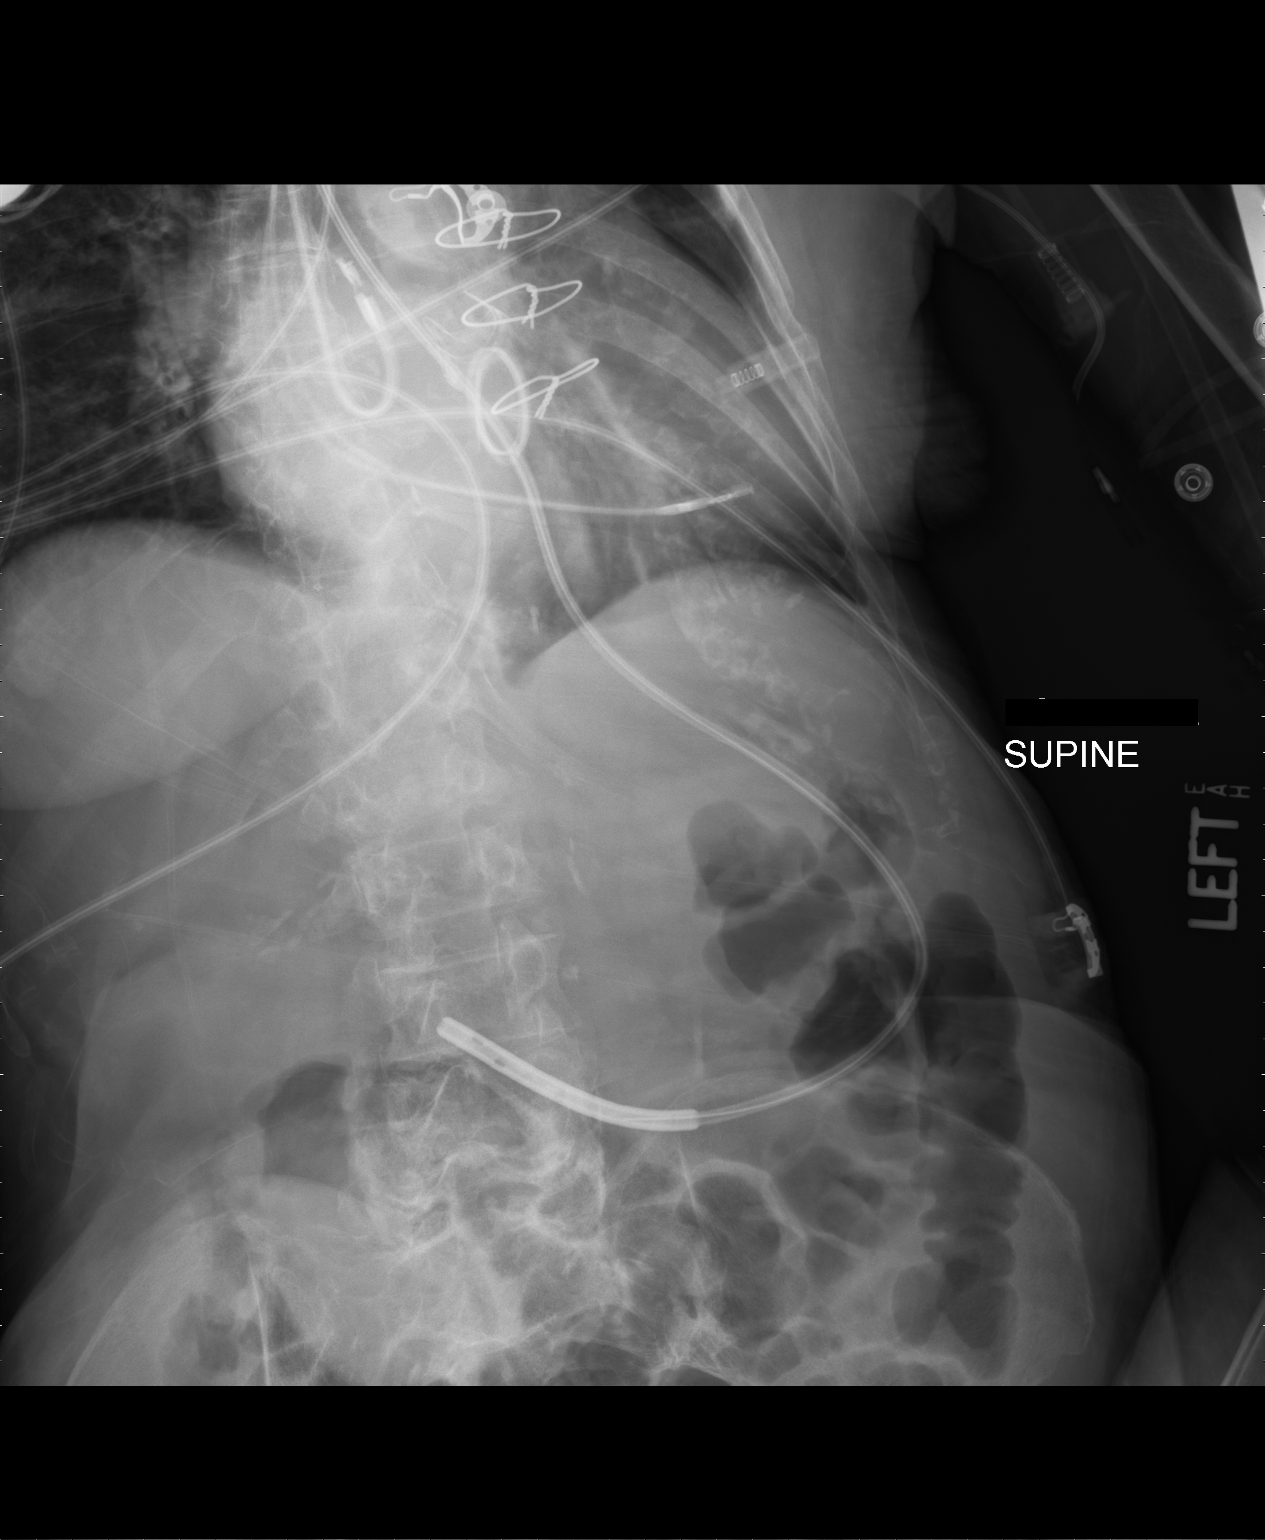

[1 of 1 positions shown; findings below may reference images not displayed]

FINDINGS: There has been interval repositioning of the patient's
Panda tube.  It is now seen ending likely at the antrum of the
stomach.  The visualized bowel gas pattern is nonspecific, and
unchanged from the prior study.

Lines are again seen overlying the mediastinum, with pacemaker
leads noted at expected position, and prior median sternotomy.  No
acute osseous abnormalities are seen.
IMPRESSION: Patient's Panda tube noted ending at the antrum of the stomach.

## 2011-01-01 LAB — CARDIAC PANEL(CRET KIN+CKTOT+MB+TROPI)
CK, MB: 1.5 ng/mL (ref 0.3–4.0)
Relative Index: 0.9 (ref 0.0–2.5)
Relative Index: 1.3 (ref 0.0–2.5)
Total CK: 162 U/L (ref 7–177)
Troponin I: 0.04 ng/mL (ref 0.00–0.06)
Troponin I: 0.04 ng/mL (ref 0.00–0.06)

## 2011-01-01 LAB — POCT I-STAT 3, ART BLOOD GAS (G3+)
Acid-Base Excess: 3 mmol/L — ABNORMAL HIGH (ref 0.0–2.0)
Bicarbonate: 28.5 mEq/L — ABNORMAL HIGH (ref 20.0–24.0)
O2 Saturation: 98 %
pO2, Arterial: 108 mmHg — ABNORMAL HIGH (ref 80.0–100.0)

## 2011-01-01 LAB — PHOSPHORUS
Phosphorus: 2.3 mg/dL (ref 2.3–4.6)
Phosphorus: 2.6 mg/dL (ref 2.3–4.6)

## 2011-01-01 LAB — PROTIME-INR
INR: 2.17 — ABNORMAL HIGH (ref 0.00–1.49)
INR: 2.8 — ABNORMAL HIGH (ref 0.00–1.49)
Prothrombin Time: 24 seconds — ABNORMAL HIGH (ref 11.6–15.2)
Prothrombin Time: 26.9 seconds — ABNORMAL HIGH (ref 11.6–15.2)
Prothrombin Time: 29.3 seconds — ABNORMAL HIGH (ref 11.6–15.2)

## 2011-01-01 LAB — BASIC METABOLIC PANEL
BUN: 12 mg/dL (ref 6–23)
BUN: 6 mg/dL (ref 6–23)
CO2: 26 mEq/L (ref 19–32)
CO2: 28 mEq/L (ref 19–32)
Calcium: 9.4 mg/dL (ref 8.4–10.5)
Calcium: 9.9 mg/dL (ref 8.4–10.5)
Chloride: 105 mEq/L (ref 96–112)
Chloride: 106 mEq/L (ref 96–112)
Creatinine, Ser: 0.93 mg/dL (ref 0.4–1.2)
Creatinine, Ser: 1.05 mg/dL (ref 0.4–1.2)
Creatinine, Ser: 1.08 mg/dL (ref 0.4–1.2)
GFR calc Af Amer: 58 mL/min — ABNORMAL LOW (ref >60)
GFR calc Af Amer: >60 mL/min (ref >60)
GFR calc non Af Amer: 48 mL/min — ABNORMAL LOW (ref >60)
GFR calc non Af Amer: 50 mL/min — ABNORMAL LOW (ref >60)
Glucose, Bld: 91 mg/dL (ref 70–99)
Potassium: 3.4 mEq/L — ABNORMAL LOW (ref 3.5–5.1)
Potassium: 3.5 mEq/L (ref 3.5–5.1)
Potassium: 3.9 mEq/L (ref 3.5–5.1)
Sodium: 139 mEq/L (ref 135–145)

## 2011-01-01 LAB — URINE CULTURE: Colony Count: 10000

## 2011-01-01 LAB — DIFFERENTIAL
Basophils Absolute: 0 10*3/uL (ref 0.0–0.1)
Basophils Absolute: 0 10*3/uL (ref 0.0–0.1)
Basophils Absolute: 0 10*3/uL (ref 0.0–0.1)
Basophils Relative: 0 % (ref 0–1)
Basophils Relative: 1 % (ref 0–1)
Lymphocytes Relative: 11 % — ABNORMAL LOW (ref 12–46)
Lymphocytes Relative: 11 % — ABNORMAL LOW (ref 12–46)
Lymphs Abs: 0.5 10*3/uL — ABNORMAL LOW (ref 0.7–4.0)
Monocytes Relative: 10 % (ref 3–12)
Neutro Abs: 1.8 10*3/uL (ref 1.7–7.7)
Neutro Abs: 3.6 10*3/uL (ref 1.7–7.7)
Neutro Abs: 4.4 10*3/uL (ref 1.7–7.7)
Neutrophils Relative %: 58 % (ref 43–77)
Neutrophils Relative %: 76 % (ref 43–77)
Neutrophils Relative %: 77 % (ref 43–77)

## 2011-01-01 LAB — ETHANOL: Alcohol, Ethyl (B): <5 mg/dL (ref 0–10)

## 2011-01-01 LAB — URINE MICROSCOPIC-ADD ON

## 2011-01-01 LAB — CULTURE, BLOOD (ROUTINE X 2)
Culture: NO GROWTH
Culture: NO GROWTH

## 2011-01-01 LAB — APTT: aPTT: 27 seconds (ref 24–37)

## 2011-01-01 LAB — URINALYSIS, ROUTINE W REFLEX MICROSCOPIC
Ketones, ur: NEGATIVE mg/dL
Leukocytes, UA: NEGATIVE
Protein, ur: 100 mg/dL — AB
Urobilinogen, UA: 0.2 mg/dL (ref 0.0–1.0)

## 2011-01-01 LAB — POCT I-STAT, CHEM 8
BUN: 15 mg/dL (ref 6–23)
Calcium, Ion: 1.17 mmol/L (ref 1.12–1.32)
Calcium, Ion: 1.2 mmol/L (ref 1.12–1.32)
Chloride: 104 mEq/L (ref 96–112)
Chloride: 105 mEq/L (ref 96–112)
Creatinine, Ser: 0.8 mg/dL (ref 0.4–1.2)
Glucose, Bld: 110 mg/dL — ABNORMAL HIGH (ref 70–99)
Glucose, Bld: 98 mg/dL (ref 70–99)
HCT: 35 % — ABNORMAL LOW (ref 36.0–46.0)
HCT: 42 % (ref 36.0–46.0)
Hemoglobin: 14.3 g/dL (ref 12.0–15.0)
Potassium: 3.8 mEq/L (ref 3.5–5.1)
Sodium: 142 mEq/L (ref 135–145)
TCO2: 27 mmol/L (ref 0–100)
TCO2: 30 mmol/L (ref 0–100)

## 2011-01-01 LAB — POCT CARDIAC MARKERS: Troponin i, poc: <0.05 ng/mL (ref 0.00–0.09)

## 2011-01-01 LAB — CBC
HCT: 25 % — ABNORMAL LOW (ref 36.0–46.0)
HCT: 31.7 % — ABNORMAL LOW (ref 36.0–46.0)
Hemoglobin: 10.9 g/dL — ABNORMAL LOW (ref 12.0–15.0)
MCHC: 34.4 g/dL (ref 30.0–36.0)
MCV: 93.6 fL (ref 78.0–100.0)
Platelets: 137 10*3/uL — ABNORMAL LOW (ref 150–400)
Platelets: 151 10*3/uL (ref 150–400)
Platelets: 151 10*3/uL (ref 150–400)
RBC: 3.01 MIL/uL — ABNORMAL LOW (ref 3.87–5.11)
RBC: 3.39 MIL/uL — ABNORMAL LOW (ref 3.87–5.11)
RDW: 14 % (ref 11.5–15.5)
WBC: 3.2 10*3/uL — ABNORMAL LOW (ref 4.0–10.5)
WBC: 4.6 10*3/uL (ref 4.0–10.5)
WBC: 5.8 10*3/uL (ref 4.0–10.5)

## 2011-01-01 LAB — TSH: TSH: 1.315 u[IU]/mL (ref 0.350–4.500)

## 2011-01-01 LAB — LIPASE, BLOOD: Lipase: 17 U/L (ref 11–59)

## 2011-01-01 LAB — MAGNESIUM
Magnesium: 1.5 mg/dL (ref 1.5–2.5)
Magnesium: 1.8 mg/dL (ref 1.5–2.5)

## 2011-01-01 LAB — BRAIN NATRIURETIC PEPTIDE: Pro B Natriuretic peptide (BNP): 143 pg/mL — ABNORMAL HIGH (ref 0.0–100.0)

## 2011-01-01 LAB — CK TOTAL AND CKMB (NOT AT ARMC)
CK, MB: 1.7 ng/mL (ref 0.3–4.0)
Relative Index: 1.3 (ref 0.0–2.5)

## 2011-01-01 LAB — T4, FREE: Free T4: 1.31 ng/dL (ref 0.80–1.80)

## 2011-01-17 LAB — URINE CULTURE: Colony Count: >=100000

## 2011-01-17 LAB — PROTIME-INR
INR: 1.8 — ABNORMAL HIGH (ref 0.00–1.49)
INR: 2.1 — ABNORMAL HIGH (ref 0.00–1.49)
INR: 2.31 — ABNORMAL HIGH (ref 0.00–1.49)
INR: 2.37 — ABNORMAL HIGH (ref 0.00–1.49)
INR: 2.43 — ABNORMAL HIGH (ref 0.00–1.49)
Prothrombin Time: 21 seconds — ABNORMAL HIGH (ref 11.6–15.2)
Prothrombin Time: 22.1 seconds — ABNORMAL HIGH (ref 11.6–15.2)
Prothrombin Time: 24.8 seconds — ABNORMAL HIGH (ref 11.6–15.2)
Prothrombin Time: 25.2 seconds — ABNORMAL HIGH (ref 11.6–15.2)
Prothrombin Time: 25.7 seconds — ABNORMAL HIGH (ref 11.6–15.2)

## 2011-01-17 LAB — CBC
HCT: 30.5 % — ABNORMAL LOW (ref 36.0–46.0)
Hemoglobin: 10.3 g/dL — ABNORMAL LOW (ref 12.0–15.0)
MCHC: 33.5 g/dL (ref 30.0–36.0)
MCHC: 34 g/dL (ref 30.0–36.0)
MCV: 94.2 fL (ref 78.0–100.0)
MCV: 95.9 fL (ref 78.0–100.0)
RBC: 3.17 MIL/uL — ABNORMAL LOW (ref 3.87–5.11)
RBC: 3.24 MIL/uL — ABNORMAL LOW (ref 3.87–5.11)
WBC: 4.5 10*3/uL (ref 4.0–10.5)
WBC: 4.9 10*3/uL (ref 4.0–10.5)

## 2011-01-17 LAB — URINE MICROSCOPIC-ADD ON

## 2011-01-17 LAB — BASIC METABOLIC PANEL
CO2: 26 mEq/L (ref 19–32)
Calcium: 10 mg/dL (ref 8.4–10.5)
GFR calc Af Amer: >60 mL/min (ref >60)
Potassium: 3.9 mEq/L (ref 3.5–5.1)
Sodium: 138 mEq/L (ref 135–145)

## 2011-01-17 LAB — CK TOTAL AND CKMB (NOT AT ARMC)
CK, MB: 1.5 ng/mL (ref 0.3–4.0)
Total CK: 57 U/L (ref 7–177)

## 2011-01-17 LAB — URINALYSIS, ROUTINE W REFLEX MICROSCOPIC
Bilirubin Urine: NEGATIVE
Glucose, UA: NEGATIVE mg/dL
Ketones, ur: NEGATIVE mg/dL
Protein, ur: 100 mg/dL — AB
Urobilinogen, UA: 1 mg/dL (ref 0.0–1.0)

## 2011-01-17 LAB — TROPONIN I: Troponin I: 0.04 ng/mL (ref 0.00–0.06)

## 2011-01-18 LAB — CBC
HCT: 26.4 % — ABNORMAL LOW (ref 36.0–46.0)
HCT: 27.5 % — ABNORMAL LOW (ref 36.0–46.0)
HCT: 27.7 % — ABNORMAL LOW (ref 36.0–46.0)
HCT: 28.5 % — ABNORMAL LOW (ref 36.0–46.0)
HCT: 29.1 % — ABNORMAL LOW (ref 36.0–46.0)
HCT: 29.5 % — ABNORMAL LOW (ref 36.0–46.0)
Hemoglobin: 9 g/dL — ABNORMAL LOW (ref 12.0–15.0)
Hemoglobin: 10.3 g/dL — ABNORMAL LOW (ref 12.0–15.0)
Hemoglobin: 9.1 g/dL — ABNORMAL LOW (ref 12.0–15.0)
Hemoglobin: 9.1 g/dL — ABNORMAL LOW (ref 12.0–15.0)
Hemoglobin: 9.2 g/dL — ABNORMAL LOW (ref 12.0–15.0)
Hemoglobin: 9.5 g/dL — ABNORMAL LOW (ref 12.0–15.0)
Hemoglobin: 9.5 g/dL — ABNORMAL LOW (ref 12.0–15.0)
Hemoglobin: 9.6 g/dL — ABNORMAL LOW (ref 12.0–15.0)
Hemoglobin: 9.7 g/dL — ABNORMAL LOW (ref 12.0–15.0)
Hemoglobin: 9.9 g/dL — ABNORMAL LOW (ref 12.0–15.0)
MCHC: 34.2 g/dL (ref 30.0–36.0)
MCHC: 32.8 g/dL (ref 30.0–36.0)
MCHC: 32.8 g/dL (ref 30.0–36.0)
MCHC: 33 g/dL (ref 30.0–36.0)
MCHC: 33.1 g/dL (ref 30.0–36.0)
MCHC: 33.2 g/dL (ref 30.0–36.0)
MCHC: 33.3 g/dL (ref 30.0–36.0)
MCHC: 33.3 g/dL (ref 30.0–36.0)
MCV: 94.3 fL (ref 78.0–100.0)
MCV: 93.7 fL (ref 78.0–100.0)
MCV: 94.4 fL (ref 78.0–100.0)
MCV: 94.5 fL (ref 78.0–100.0)
MCV: 95.3 fL (ref 78.0–100.0)
Platelets: 217 10*3/uL (ref 150–400)
Platelets: 238 10*3/uL (ref 150–400)
Platelets: 293 10*3/uL (ref 150–400)
Platelets: 312 10*3/uL (ref 150–400)
Platelets: 326 10*3/uL (ref 150–400)
Platelets: 351 10*3/uL (ref 150–400)
RBC: 2.8 MIL/uL — ABNORMAL LOW (ref 3.87–5.11)
RBC: 2.93 MIL/uL — ABNORMAL LOW (ref 3.87–5.11)
RBC: 3.06 MIL/uL — ABNORMAL LOW (ref 3.87–5.11)
RBC: 3.13 MIL/uL — ABNORMAL LOW (ref 3.87–5.11)
RBC: 3.14 MIL/uL — ABNORMAL LOW (ref 3.87–5.11)
RBC: 3.23 MIL/uL — ABNORMAL LOW (ref 3.87–5.11)
RDW: 14.9 % (ref 11.5–15.5)
RDW: 14.9 % (ref 11.5–15.5)
RDW: 15.2 % (ref 11.5–15.5)
RDW: 15.4 % (ref 11.5–15.5)
RDW: 15.6 % — ABNORMAL HIGH (ref 11.5–15.5)
RDW: 15.9 % — ABNORMAL HIGH (ref 11.5–15.5)
RDW: 15.9 % — ABNORMAL HIGH (ref 11.5–15.5)
WBC: 6.9 10*3/uL (ref 4.0–10.5)
WBC: 11.1 10*3/uL — ABNORMAL HIGH (ref 4.0–10.5)
WBC: 11.7 10*3/uL — ABNORMAL HIGH (ref 4.0–10.5)
WBC: 12.1 10*3/uL — ABNORMAL HIGH (ref 4.0–10.5)
WBC: 12.3 10*3/uL — ABNORMAL HIGH (ref 4.0–10.5)
WBC: 6.3 10*3/uL (ref 4.0–10.5)
WBC: 6.6 10*3/uL (ref 4.0–10.5)

## 2011-01-18 LAB — GLUCOSE, CAPILLARY
Glucose-Capillary: 105 mg/dL — ABNORMAL HIGH (ref 70–99)
Glucose-Capillary: 105 mg/dL — ABNORMAL HIGH (ref 70–99)
Glucose-Capillary: 105 mg/dL — ABNORMAL HIGH (ref 70–99)
Glucose-Capillary: 109 mg/dL — ABNORMAL HIGH (ref 70–99)
Glucose-Capillary: 112 mg/dL — ABNORMAL HIGH (ref 70–99)
Glucose-Capillary: 112 mg/dL — ABNORMAL HIGH (ref 70–99)
Glucose-Capillary: 117 mg/dL — ABNORMAL HIGH (ref 70–99)
Glucose-Capillary: 119 mg/dL — ABNORMAL HIGH (ref 70–99)
Glucose-Capillary: 129 mg/dL — ABNORMAL HIGH (ref 70–99)
Glucose-Capillary: 59 mg/dL — ABNORMAL LOW (ref 70–99)
Glucose-Capillary: 65 mg/dL — ABNORMAL LOW (ref 70–99)
Glucose-Capillary: 72 mg/dL (ref 70–99)
Glucose-Capillary: 74 mg/dL (ref 70–99)
Glucose-Capillary: 78 mg/dL (ref 70–99)
Glucose-Capillary: 80 mg/dL (ref 70–99)
Glucose-Capillary: 96 mg/dL (ref 70–99)
Glucose-Capillary: 96 mg/dL (ref 70–99)

## 2011-01-18 LAB — BASIC METABOLIC PANEL
BUN: 21 mg/dL (ref 6–23)
BUN: 22 mg/dL (ref 6–23)
BUN: 22 mg/dL (ref 6–23)
CO2: 30 mEq/L (ref 19–32)
CO2: 33 mEq/L — ABNORMAL HIGH (ref 19–32)
CO2: 33 mEq/L — ABNORMAL HIGH (ref 19–32)
CO2: 33 mEq/L — ABNORMAL HIGH (ref 19–32)
CO2: 34 mEq/L — ABNORMAL HIGH (ref 19–32)
Calcium: 9.2 mg/dL (ref 8.4–10.5)
Calcium: 9.2 mg/dL (ref 8.4–10.5)
Calcium: 9.3 mg/dL (ref 8.4–10.5)
Calcium: 9.3 mg/dL (ref 8.4–10.5)
Calcium: 9.6 mg/dL (ref 8.4–10.5)
Chloride: 101 mEq/L (ref 96–112)
Chloride: 102 mEq/L (ref 96–112)
Creatinine, Ser: 0.89 mg/dL (ref 0.4–1.2)
Creatinine, Ser: 0.92 mg/dL (ref 0.4–1.2)
Creatinine, Ser: 1.11 mg/dL (ref 0.4–1.2)
GFR calc Af Amer: 54 mL/min — ABNORMAL LOW (ref >60)
GFR calc Af Amer: 57 mL/min — ABNORMAL LOW (ref >60)
GFR calc Af Amer: 57 mL/min — ABNORMAL LOW (ref >60)
GFR calc non Af Amer: 43 mL/min — ABNORMAL LOW (ref >60)
GFR calc non Af Amer: 45 mL/min — ABNORMAL LOW (ref >60)
GFR calc non Af Amer: 47 mL/min — ABNORMAL LOW (ref >60)
GFR calc non Af Amer: 52 mL/min — ABNORMAL LOW (ref >60)
GFR calc non Af Amer: >60 mL/min (ref >60)
GFR calc non Af Amer: >60 mL/min (ref >60)
Glucose, Bld: 116 mg/dL — ABNORMAL HIGH (ref 70–99)
Glucose, Bld: 120 mg/dL — ABNORMAL HIGH (ref 70–99)
Glucose, Bld: 122 mg/dL — ABNORMAL HIGH (ref 70–99)
Glucose, Bld: 125 mg/dL — ABNORMAL HIGH (ref 70–99)
Glucose, Bld: 93 mg/dL (ref 70–99)
Potassium: 3.3 mEq/L — ABNORMAL LOW (ref 3.5–5.1)
Potassium: 3.5 mEq/L (ref 3.5–5.1)
Potassium: 3.5 mEq/L (ref 3.5–5.1)
Sodium: 134 mEq/L — ABNORMAL LOW (ref 135–145)
Sodium: 136 mEq/L (ref 135–145)
Sodium: 141 mEq/L (ref 135–145)
Sodium: 141 mEq/L (ref 135–145)
Sodium: 141 mEq/L (ref 135–145)
Sodium: 142 mEq/L (ref 135–145)

## 2011-01-18 LAB — COMPREHENSIVE METABOLIC PANEL
Alkaline Phosphatase: 67 U/L (ref 39–117)
BUN: 18 mg/dL (ref 6–23)
Calcium: 10 mg/dL (ref 8.4–10.5)
Creatinine, Ser: 1.18 mg/dL (ref 0.4–1.2)
Glucose, Bld: 91 mg/dL (ref 70–99)
Potassium: 3.8 mEq/L (ref 3.5–5.1)
Total Protein: 7.1 g/dL (ref 6.0–8.3)

## 2011-01-18 LAB — DIFFERENTIAL
Basophils Absolute: 0 10*3/uL (ref 0.0–0.1)
Basophils Relative: 1 % (ref 0–1)
Lymphocytes Relative: 5 % — ABNORMAL LOW (ref 12–46)
Monocytes Relative: 7 % (ref 3–12)
Neutro Abs: 6.3 10*3/uL (ref 1.7–7.7)
Neutrophils Relative %: 86 % — ABNORMAL HIGH (ref 43–77)

## 2011-01-18 LAB — PROTIME-INR
INR: 1.7 — ABNORMAL HIGH (ref 0.00–1.49)
INR: 1.8 — ABNORMAL HIGH (ref 0.00–1.49)
INR: 1.8 — ABNORMAL HIGH (ref 0.00–1.49)
INR: 1.8 — ABNORMAL HIGH (ref 0.00–1.49)
INR: 1.9 — ABNORMAL HIGH (ref 0.00–1.49)
INR: 2.2 — ABNORMAL HIGH (ref 0.00–1.49)
INR: 2.2 — ABNORMAL HIGH (ref 0.00–1.49)
INR: 2.4 — ABNORMAL HIGH (ref 0.00–1.49)
INR: 2.5 — ABNORMAL HIGH (ref 0.00–1.49)
INR: 2.7 — ABNORMAL HIGH (ref 0.00–1.49)
INR: 1.2 (ref 0.00–1.49)
INR: 1.3 (ref 0.00–1.49)
INR: 1.3 (ref 0.00–1.49)
INR: 1.4 (ref 0.00–1.49)
INR: 1.4 (ref 0.00–1.49)
INR: 1.4 (ref 0.00–1.49)
INR: 1.4 (ref 0.00–1.49)
INR: 1.5 (ref 0.00–1.49)
Prothrombin Time: 20.7 seconds — ABNORMAL HIGH (ref 11.6–15.2)
Prothrombin Time: 21.1 seconds — ABNORMAL HIGH (ref 11.6–15.2)
Prothrombin Time: 21.9 seconds — ABNORMAL HIGH (ref 11.6–15.2)
Prothrombin Time: 22.2 seconds — ABNORMAL HIGH (ref 11.6–15.2)
Prothrombin Time: 24.1 seconds — ABNORMAL HIGH (ref 11.6–15.2)
Prothrombin Time: 25.4 seconds — ABNORMAL HIGH (ref 11.6–15.2)
Prothrombin Time: 25.8 seconds — ABNORMAL HIGH (ref 11.6–15.2)
Prothrombin Time: 26.7 seconds — ABNORMAL HIGH (ref 11.6–15.2)
Prothrombin Time: 28.4 seconds — ABNORMAL HIGH (ref 11.6–15.2)
Prothrombin Time: 15.2 seconds (ref 11.6–15.2)
Prothrombin Time: 15.8 seconds — ABNORMAL HIGH (ref 11.6–15.2)
Prothrombin Time: 16.7 seconds — ABNORMAL HIGH (ref 11.6–15.2)
Prothrombin Time: 17 seconds — ABNORMAL HIGH (ref 11.6–15.2)
Prothrombin Time: 17.1 seconds — ABNORMAL HIGH (ref 11.6–15.2)
Prothrombin Time: 17.1 seconds — ABNORMAL HIGH (ref 11.6–15.2)
Prothrombin Time: 18.2 seconds — ABNORMAL HIGH (ref 11.6–15.2)
Prothrombin Time: 18.5 seconds — ABNORMAL HIGH (ref 11.6–15.2)

## 2011-01-18 LAB — URINE CULTURE

## 2011-01-18 LAB — URINALYSIS, ROUTINE W REFLEX MICROSCOPIC
Bilirubin Urine: NEGATIVE
Ketones, ur: NEGATIVE mg/dL
Nitrite: NEGATIVE
Protein, ur: NEGATIVE mg/dL
Urobilinogen, UA: 1 mg/dL (ref 0.0–1.0)

## 2011-01-18 LAB — URINE MICROSCOPIC-ADD ON

## 2011-01-18 LAB — HEPARIN LEVEL (UNFRACTIONATED)
Heparin Unfractionated: 0.28 IU/mL — ABNORMAL LOW (ref 0.30–0.70)
Heparin Unfractionated: 0.47 IU/mL (ref 0.30–0.70)
Heparin Unfractionated: 0.19 IU/mL — ABNORMAL LOW (ref 0.30–0.70)
Heparin Unfractionated: 0.25 IU/mL — ABNORMAL LOW (ref 0.30–0.70)
Heparin Unfractionated: 0.32 IU/mL (ref 0.30–0.70)
Heparin Unfractionated: 0.42 IU/mL (ref 0.30–0.70)
Heparin Unfractionated: 0.42 IU/mL (ref 0.30–0.70)
Heparin Unfractionated: 0.46 IU/mL (ref 0.30–0.70)
Heparin Unfractionated: 0.47 IU/mL (ref 0.30–0.70)
Heparin Unfractionated: 0.52 IU/mL (ref 0.30–0.70)
Heparin Unfractionated: <0.1 IU/mL — ABNORMAL LOW (ref 0.30–0.70)

## 2011-01-18 LAB — BLOOD GAS, ARTERIAL
Acid-Base Excess: 7.3 mmol/L — ABNORMAL HIGH (ref 0.0–2.0)
Bicarbonate: 31 mEq/L — ABNORMAL HIGH (ref 20.0–24.0)
FIO2: 0.5 %
O2 Saturation: 99.8 %
TCO2: 32.3 mmol/L (ref 0–100)
pO2, Arterial: 198 mmHg — ABNORMAL HIGH (ref 80.0–100.0)

## 2011-01-18 LAB — CULTURE, RESPIRATORY W GRAM STAIN

## 2011-01-18 LAB — PHOSPHORUS: Phosphorus: 2.9 mg/dL (ref 2.3–4.6)

## 2011-01-18 LAB — HEPARIN ANTI-XA: Heparin LMW: 0.28 IU/mL

## 2011-01-19 LAB — POCT I-STAT 3, ART BLOOD GAS (G3+)
Acid-Base Excess: 2 mmol/L (ref 0.0–2.0)
Acid-Base Excess: 2 mmol/L (ref 0.0–2.0)
Acid-Base Excess: 3 mmol/L — ABNORMAL HIGH (ref 0.0–2.0)
Acid-Base Excess: 4 mmol/L — ABNORMAL HIGH (ref 0.0–2.0)
Acid-Base Excess: 4 mmol/L — ABNORMAL HIGH (ref 0.0–2.0)
Acid-base deficit: 1 mmol/L (ref 0.0–2.0)
Acid-base deficit: 2 mmol/L (ref 0.0–2.0)
Bicarbonate: 22.8 mEq/L (ref 20.0–24.0)
Bicarbonate: 24 mEq/L (ref 20.0–24.0)
Bicarbonate: 27 mEq/L — ABNORMAL HIGH (ref 20.0–24.0)
Bicarbonate: 28.2 mEq/L — ABNORMAL HIGH (ref 20.0–24.0)
Bicarbonate: 28.2 meq/L — ABNORMAL HIGH (ref 20.0–24.0)
Bicarbonate: 28.7 mEq/L — ABNORMAL HIGH (ref 20.0–24.0)
O2 Saturation: 100 %
O2 Saturation: 100 %
O2 Saturation: 96 %
O2 Saturation: 96 %
O2 Saturation: 99 %
O2 Saturation: 99 %
Patient temperature: 37.1
Patient temperature: 37.3
Patient temperature: 37.4
Patient temperature: 98.4
TCO2: 28 mmol/L (ref 0–100)
TCO2: 28 mmol/L (ref 0–100)
TCO2: 29 mmol/L (ref 0–100)
TCO2: 30 mmol/L (ref 0–100)
TCO2: 31 mmol/L (ref 0–100)
pCO2 arterial: 36.4 mmHg (ref 35.0–45.0)
pCO2 arterial: 40.6 mmHg (ref 35.0–45.0)
pCO2 arterial: 43.8 mmHg (ref 35.0–45.0)
pCO2 arterial: 44.5 mmHg (ref 35.0–45.0)
pCO2 arterial: 46 mmHg — ABNORMAL HIGH (ref 35.0–45.0)
pCO2 arterial: 46.8 mmHg — ABNORMAL HIGH (ref 35.0–45.0)
pCO2 arterial: 47.5 mmHg — ABNORMAL HIGH (ref 35.0–45.0)
pCO2 arterial: 48.7 mmHg — ABNORMAL HIGH (ref 35.0–45.0)
pH, Arterial: 7.278 — ABNORMAL LOW (ref 7.350–7.400)
pH, Arterial: 7.32 — ABNORMAL LOW (ref 7.350–7.400)
pH, Arterial: 7.383 (ref 7.350–7.400)
pH, Arterial: 7.388 (ref 7.350–7.400)
pH, Arterial: 7.391 (ref 7.350–7.400)
pH, Arterial: 7.406 — ABNORMAL HIGH (ref 7.350–7.400)
pH, Arterial: 7.429 — ABNORMAL HIGH (ref 7.350–7.400)
pH, Arterial: 7.458 — ABNORMAL HIGH (ref 7.350–7.400)
pO2, Arterial: 113 mmHg — ABNORMAL HIGH (ref 80.0–100.0)
pO2, Arterial: 123 mmHg — ABNORMAL HIGH (ref 80.0–100.0)
pO2, Arterial: 132 mmHg — ABNORMAL HIGH (ref 80.0–100.0)
pO2, Arterial: 299 mmHg — ABNORMAL HIGH (ref 80.0–100.0)
pO2, Arterial: 362 mmHg — ABNORMAL HIGH (ref 80.0–100.0)
pO2, Arterial: 531 mmHg — ABNORMAL HIGH (ref 80.0–100.0)
pO2, Arterial: 75 mmHg — ABNORMAL LOW (ref 80.0–100.0)
pO2, Arterial: 80 mmHg (ref 80.0–100.0)
pO2, Arterial: 81 mmHg (ref 80.0–100.0)
pO2, Arterial: 82 mmHg (ref 80.0–100.0)
pO2, Arterial: 94 mmHg (ref 80.0–100.0)

## 2011-01-19 LAB — URINE MICROSCOPIC-ADD ON

## 2011-01-19 LAB — URINALYSIS, ROUTINE W REFLEX MICROSCOPIC
Glucose, UA: NEGATIVE mg/dL
pH: 7.5 (ref 5.0–8.0)

## 2011-01-19 LAB — COMPREHENSIVE METABOLIC PANEL
ALT: 15 U/L (ref 0–35)
ALT: 20 U/L (ref 0–35)
AST: 31 U/L (ref 0–37)
Albumin: 2.6 g/dL — ABNORMAL LOW (ref 3.5–5.2)
Albumin: 2.7 g/dL — ABNORMAL LOW (ref 3.5–5.2)
Albumin: 3.5 g/dL (ref 3.5–5.2)
Alkaline Phosphatase: 41 U/L (ref 39–117)
Alkaline Phosphatase: 45 U/L (ref 39–117)
Alkaline Phosphatase: 64 U/L (ref 39–117)
BUN: 12 mg/dL (ref 6–23)
BUN: 12 mg/dL (ref 6–23)
BUN: 12 mg/dL (ref 6–23)
CO2: 27 mEq/L (ref 19–32)
Chloride: 104 mEq/L (ref 96–112)
Chloride: 110 mEq/L (ref 96–112)
Chloride: 111 mEq/L (ref 96–112)
GFR calc Af Amer: >60 mL/min (ref >60)
GFR calc non Af Amer: 38 mL/min — ABNORMAL LOW (ref >60)
Glucose, Bld: 96 mg/dL (ref 70–99)
Potassium: 3.7 mEq/L (ref 3.5–5.1)
Potassium: 3.9 mEq/L (ref 3.5–5.1)
Potassium: 3.9 mEq/L (ref 3.5–5.1)
Sodium: 144 mEq/L (ref 135–145)
Sodium: 145 mEq/L (ref 135–145)
Total Bilirubin: 0.6 mg/dL (ref 0.3–1.2)
Total Bilirubin: 0.7 mg/dL (ref 0.3–1.2)
Total Bilirubin: 0.9 mg/dL (ref 0.3–1.2)
Total Protein: 5.8 g/dL — ABNORMAL LOW (ref 6.0–8.3)
Total Protein: 5.8 g/dL — ABNORMAL LOW (ref 6.0–8.3)

## 2011-01-19 LAB — POCT I-STAT 4, (NA,K, GLUC, HGB,HCT)
Glucose, Bld: 90 mg/dL (ref 70–99)
Glucose, Bld: 90 mg/dL (ref 70–99)
Glucose, Bld: 92 mg/dL (ref 70–99)
Glucose, Bld: 94 mg/dL (ref 70–99)
HCT: 20 % — ABNORMAL LOW (ref 36.0–46.0)
HCT: 23 % — ABNORMAL LOW (ref 36.0–46.0)
HCT: 24 % — ABNORMAL LOW (ref 36.0–46.0)
HCT: 25 % — ABNORMAL LOW (ref 36.0–46.0)
HCT: 26 % — ABNORMAL LOW (ref 36.0–46.0)
HCT: 26 % — ABNORMAL LOW (ref 36.0–46.0)
HCT: 29 % — ABNORMAL LOW (ref 36.0–46.0)
Hemoglobin: 6.8 g/dL — CL (ref 12.0–15.0)
Hemoglobin: 7.5 g/dL — CL (ref 12.0–15.0)
Hemoglobin: 8.2 g/dL — ABNORMAL LOW (ref 12.0–15.0)
Hemoglobin: 8.5 g/dL — ABNORMAL LOW (ref 12.0–15.0)
Hemoglobin: 8.8 g/dL — ABNORMAL LOW (ref 12.0–15.0)
Hemoglobin: 8.8 g/dL — ABNORMAL LOW (ref 12.0–15.0)
Potassium: 3.8 mEq/L (ref 3.5–5.1)
Potassium: 4.8 mEq/L (ref 3.5–5.1)
Potassium: 4.9 mEq/L (ref 3.5–5.1)
Sodium: 133 mEq/L — ABNORMAL LOW (ref 135–145)
Sodium: 135 mEq/L (ref 135–145)
Sodium: 139 mEq/L (ref 135–145)
Sodium: 143 mEq/L (ref 135–145)

## 2011-01-19 LAB — CBC
HCT: 24.3 % — ABNORMAL LOW (ref 36.0–46.0)
HCT: 25.1 % — ABNORMAL LOW (ref 36.0–46.0)
HCT: 26.1 % — ABNORMAL LOW (ref 36.0–46.0)
HCT: 26.4 % — ABNORMAL LOW (ref 36.0–46.0)
HCT: 26.4 % — ABNORMAL LOW (ref 36.0–46.0)
HCT: 26.9 % — ABNORMAL LOW (ref 36.0–46.0)
HCT: 27.1 % — ABNORMAL LOW (ref 36.0–46.0)
HCT: 27.9 % — ABNORMAL LOW (ref 36.0–46.0)
HCT: 28.1 % — ABNORMAL LOW (ref 36.0–46.0)
HCT: 29 % — ABNORMAL LOW (ref 36.0–46.0)
HCT: 30.3 % — ABNORMAL LOW (ref 36.0–46.0)
Hemoglobin: 10 g/dL — ABNORMAL LOW (ref 12.0–15.0)
Hemoglobin: 8.9 g/dL — ABNORMAL LOW (ref 12.0–15.0)
Hemoglobin: 9 g/dL — ABNORMAL LOW (ref 12.0–15.0)
Hemoglobin: 9.1 g/dL — ABNORMAL LOW (ref 12.0–15.0)
Hemoglobin: 9.5 g/dL — ABNORMAL LOW (ref 12.0–15.0)
Hemoglobin: 9.6 g/dL — ABNORMAL LOW (ref 12.0–15.0)
Hemoglobin: 9.9 g/dL — ABNORMAL LOW (ref 12.0–15.0)
MCHC: 33.2 g/dL (ref 30.0–36.0)
MCHC: 33.2 g/dL (ref 30.0–36.0)
MCHC: 33.5 g/dL (ref 30.0–36.0)
MCHC: 33.7 g/dL (ref 30.0–36.0)
MCHC: 33.7 g/dL (ref 30.0–36.0)
MCHC: 33.9 g/dL (ref 30.0–36.0)
MCHC: 34.2 g/dL (ref 30.0–36.0)
MCV: 94.4 fL (ref 78.0–100.0)
MCV: 95 fL (ref 78.0–100.0)
MCV: 96 fL (ref 78.0–100.0)
MCV: 98.7 fL (ref 78.0–100.0)
MCV: 99 fL (ref 78.0–100.0)
MCV: 99.2 fL (ref 78.0–100.0)
MCV: 99.3 fL (ref 78.0–100.0)
Platelets: 123 10*3/uL — ABNORMAL LOW (ref 150–400)
Platelets: 130 10*3/uL — ABNORMAL LOW (ref 150–400)
Platelets: 130 10*3/uL — ABNORMAL LOW (ref 150–400)
Platelets: 131 10*3/uL — ABNORMAL LOW (ref 150–400)
Platelets: 131 K/uL — ABNORMAL LOW (ref 150–400)
Platelets: 139 10*3/uL — ABNORMAL LOW (ref 150–400)
Platelets: 154 10*3/uL (ref 150–400)
Platelets: 54 10*3/uL — ABNORMAL LOW (ref 150–400)
Platelets: 62 10*3/uL — ABNORMAL LOW (ref 150–400)
Platelets: 66 10*3/uL — ABNORMAL LOW (ref 150–400)
Platelets: 77 10*3/uL — ABNORMAL LOW (ref 150–400)
Platelets: 77 10*3/uL — ABNORMAL LOW (ref 150–400)
Platelets: 78 10*3/uL — ABNORMAL LOW (ref 150–400)
Platelets: 87 10*3/uL — ABNORMAL LOW (ref 150–400)
RBC: 2.64 MIL/uL — ABNORMAL LOW (ref 3.87–5.11)
RBC: 2.69 MIL/uL — ABNORMAL LOW (ref 3.87–5.11)
RBC: 2.71 MIL/uL — ABNORMAL LOW (ref 3.87–5.11)
RBC: 2.87 MIL/uL — ABNORMAL LOW (ref 3.87–5.11)
RBC: 2.92 MIL/uL — ABNORMAL LOW (ref 3.87–5.11)
RBC: 3.19 MIL/uL — ABNORMAL LOW (ref 3.87–5.11)
RBC: 3.24 MIL/uL — ABNORMAL LOW (ref 3.87–5.11)
RDW: 15.6 % — ABNORMAL HIGH (ref 11.5–15.5)
RDW: 15.6 % — ABNORMAL HIGH (ref 11.5–15.5)
RDW: 15.7 % — ABNORMAL HIGH (ref 11.5–15.5)
RDW: 15.9 % — ABNORMAL HIGH (ref 11.5–15.5)
RDW: 15.9 % — ABNORMAL HIGH (ref 11.5–15.5)
RDW: 16.1 % — ABNORMAL HIGH (ref 11.5–15.5)
RDW: 17.3 % — ABNORMAL HIGH (ref 11.5–15.5)
RDW: 17.6 % — ABNORMAL HIGH (ref 11.5–15.5)
RDW: 18 % — ABNORMAL HIGH (ref 11.5–15.5)
WBC: 1.8 10*3/uL — ABNORMAL LOW (ref 4.0–10.5)
WBC: 10 10*3/uL (ref 4.0–10.5)
WBC: 12.4 10*3/uL — ABNORMAL HIGH (ref 4.0–10.5)
WBC: 3.3 10*3/uL — ABNORMAL LOW (ref 4.0–10.5)
WBC: 3.7 10*3/uL — ABNORMAL LOW (ref 4.0–10.5)
WBC: 3.8 K/uL — ABNORMAL LOW (ref 4.0–10.5)
WBC: 3.9 10*3/uL — ABNORMAL LOW (ref 4.0–10.5)
WBC: 7.9 10*3/uL (ref 4.0–10.5)
WBC: 8.9 10*3/uL (ref 4.0–10.5)
WBC: 9.8 10*3/uL (ref 4.0–10.5)

## 2011-01-19 LAB — POCT I-STAT, CHEM 8
BUN: 16 mg/dL (ref 6–23)
BUN: 27 mg/dL — ABNORMAL HIGH (ref 6–23)
Calcium, Ion: 1.23 mmol/L (ref 1.12–1.32)
Calcium, Ion: 1.32 mmol/L (ref 1.12–1.32)
Creatinine, Ser: 1.2 mg/dL (ref 0.4–1.2)
Glucose, Bld: 110 mg/dL — ABNORMAL HIGH (ref 70–99)
Hemoglobin: 9.9 g/dL — ABNORMAL LOW (ref 12.0–15.0)
Sodium: 138 mEq/L (ref 135–145)
Sodium: 139 mEq/L (ref 135–145)
TCO2: 21 mmol/L (ref 0–100)
TCO2: 23 mmol/L (ref 0–100)

## 2011-01-19 LAB — PREPARE PLATELETS

## 2011-01-19 LAB — BLOOD GAS, ARTERIAL
Bicarbonate: 25.4 mEq/L — ABNORMAL HIGH (ref 20.0–24.0)
Patient temperature: 98.6
TCO2: 26.6 mmol/L (ref 0–100)
pCO2 arterial: 38.4 mmHg (ref 35.0–45.0)
pH, Arterial: 7.437 — ABNORMAL HIGH (ref 7.350–7.400)

## 2011-01-19 LAB — GLUCOSE, CAPILLARY
Glucose-Capillary: 100 mg/dL — ABNORMAL HIGH (ref 70–99)
Glucose-Capillary: 104 mg/dL — ABNORMAL HIGH (ref 70–99)
Glucose-Capillary: 105 mg/dL — ABNORMAL HIGH (ref 70–99)
Glucose-Capillary: 105 mg/dL — ABNORMAL HIGH (ref 70–99)
Glucose-Capillary: 106 mg/dL — ABNORMAL HIGH (ref 70–99)
Glucose-Capillary: 106 mg/dL — ABNORMAL HIGH (ref 70–99)
Glucose-Capillary: 107 mg/dL — ABNORMAL HIGH (ref 70–99)
Glucose-Capillary: 108 mg/dL — ABNORMAL HIGH (ref 70–99)
Glucose-Capillary: 109 mg/dL — ABNORMAL HIGH (ref 70–99)
Glucose-Capillary: 109 mg/dL — ABNORMAL HIGH (ref 70–99)
Glucose-Capillary: 112 mg/dL — ABNORMAL HIGH (ref 70–99)
Glucose-Capillary: 112 mg/dL — ABNORMAL HIGH (ref 70–99)
Glucose-Capillary: 114 mg/dL — ABNORMAL HIGH (ref 70–99)
Glucose-Capillary: 117 mg/dL — ABNORMAL HIGH (ref 70–99)
Glucose-Capillary: 119 mg/dL — ABNORMAL HIGH (ref 70–99)
Glucose-Capillary: 127 mg/dL — ABNORMAL HIGH (ref 70–99)
Glucose-Capillary: 133 mg/dL — ABNORMAL HIGH (ref 70–99)
Glucose-Capillary: 138 mg/dL — ABNORMAL HIGH (ref 70–99)
Glucose-Capillary: 64 mg/dL — ABNORMAL LOW (ref 70–99)
Glucose-Capillary: 74 mg/dL (ref 70–99)
Glucose-Capillary: 84 mg/dL (ref 70–99)
Glucose-Capillary: 86 mg/dL (ref 70–99)
Glucose-Capillary: 86 mg/dL (ref 70–99)
Glucose-Capillary: 88 mg/dL (ref 70–99)
Glucose-Capillary: 89 mg/dL (ref 70–99)
Glucose-Capillary: 94 mg/dL (ref 70–99)
Glucose-Capillary: 96 mg/dL (ref 70–99)
Glucose-Capillary: 96 mg/dL (ref 70–99)

## 2011-01-19 LAB — BASIC METABOLIC PANEL
BUN: 11 mg/dL (ref 6–23)
BUN: 11 mg/dL (ref 6–23)
BUN: 16 mg/dL (ref 6–23)
BUN: 19 mg/dL (ref 6–23)
BUN: 27 mg/dL — ABNORMAL HIGH (ref 6–23)
BUN: 28 mg/dL — ABNORMAL HIGH (ref 6–23)
CO2: 25 mEq/L (ref 19–32)
CO2: 25 mEq/L (ref 19–32)
CO2: 28 mEq/L (ref 19–32)
CO2: 29 mEq/L (ref 19–32)
CO2: 31 mEq/L (ref 19–32)
CO2: 31 mEq/L (ref 19–32)
Calcium: 9 mg/dL (ref 8.4–10.5)
Calcium: 9 mg/dL (ref 8.4–10.5)
Calcium: 9.6 mg/dL (ref 8.4–10.5)
Chloride: 104 mEq/L (ref 96–112)
Chloride: 105 mEq/L (ref 96–112)
Chloride: 106 mEq/L (ref 96–112)
Chloride: 106 mEq/L (ref 96–112)
Chloride: 109 mEq/L (ref 96–112)
Creatinine, Ser: 1.05 mg/dL (ref 0.4–1.2)
Creatinine, Ser: 1.13 mg/dL (ref 0.4–1.2)
Creatinine, Ser: 1.55 mg/dL — ABNORMAL HIGH (ref 0.4–1.2)
GFR calc Af Amer: 48 mL/min — ABNORMAL LOW (ref >60)
GFR calc Af Amer: >60 mL/min (ref >60)
GFR calc non Af Amer: 25 mL/min — ABNORMAL LOW (ref >60)
GFR calc non Af Amer: 32 mL/min — ABNORMAL LOW (ref >60)
GFR calc non Af Amer: 42 mL/min — ABNORMAL LOW (ref >60)
Glucose, Bld: 104 mg/dL — ABNORMAL HIGH (ref 70–99)
Glucose, Bld: 104 mg/dL — ABNORMAL HIGH (ref 70–99)
Glucose, Bld: 134 mg/dL — ABNORMAL HIGH (ref 70–99)
Glucose, Bld: 91 mg/dL (ref 70–99)
Glucose, Bld: 95 mg/dL (ref 70–99)
Glucose, Bld: 99 mg/dL (ref 70–99)
Potassium: 3.3 mEq/L — ABNORMAL LOW (ref 3.5–5.1)
Potassium: 3.6 mEq/L (ref 3.5–5.1)
Potassium: 3.8 mEq/L (ref 3.5–5.1)
Potassium: 4.1 mEq/L (ref 3.5–5.1)
Potassium: 4.2 mEq/L (ref 3.5–5.1)
Potassium: 4.6 mEq/L (ref 3.5–5.1)
Sodium: 134 mEq/L — ABNORMAL LOW (ref 135–145)
Sodium: 137 mEq/L (ref 135–145)
Sodium: 137 mEq/L (ref 135–145)
Sodium: 139 mEq/L (ref 135–145)
Sodium: 140 mEq/L (ref 135–145)

## 2011-01-19 LAB — POCT I-STAT 3, VENOUS BLOOD GAS (G3P V)
Acid-Base Excess: 3 mmol/L — ABNORMAL HIGH (ref 0.0–2.0)
Acid-base deficit: 3 mmol/L — ABNORMAL HIGH (ref 0.0–2.0)
Bicarbonate: 22.2 mEq/L (ref 20.0–24.0)
Bicarbonate: 29.1 meq/L — ABNORMAL HIGH (ref 20.0–24.0)
O2 Saturation: 55 %
TCO2: 31 mmol/L (ref 0–100)
pCO2, Ven: 39.9 mmHg — ABNORMAL LOW (ref 45.0–50.0)
pCO2, Ven: 50.8 mmHg — ABNORMAL HIGH (ref 45.0–50.0)
pH, Ven: 7.366 — ABNORMAL HIGH (ref 7.250–7.300)
pO2, Ven: 30 mmHg (ref 30.0–45.0)
pO2, Ven: 72 mmHg — ABNORMAL HIGH (ref 30.0–45.0)

## 2011-01-19 LAB — PROTIME-INR
INR: 1.1 (ref 0.00–1.49)
INR: 1.3 (ref 0.00–1.49)
INR: 1.4 (ref 0.00–1.49)
INR: 1.4 (ref 0.00–1.49)
INR: 1.8 — ABNORMAL HIGH (ref 0.00–1.49)
INR: 1.9 — ABNORMAL HIGH (ref 0.00–1.49)
INR: 2.1 — ABNORMAL HIGH (ref 0.00–1.49)
Prothrombin Time: 14 seconds (ref 11.6–15.2)
Prothrombin Time: 15.9 s — ABNORMAL HIGH (ref 11.6–15.2)

## 2011-01-19 LAB — CULTURE, RESPIRATORY W GRAM STAIN: Gram Stain: NONE SEEN

## 2011-01-19 LAB — HEMOCCULT GUIAC POC 1CARD (OFFICE): Fecal Occult Bld: POSITIVE

## 2011-01-19 LAB — TYPE AND SCREEN

## 2011-01-19 LAB — BASIC METABOLIC PANEL WITH GFR
BUN: 12 mg/dL (ref 6–23)
CO2: 30 meq/L (ref 19–32)
Calcium: 9.7 mg/dL (ref 8.4–10.5)
Chloride: 108 meq/L (ref 96–112)
Creatinine, Ser: 1.24 mg/dL — ABNORMAL HIGH (ref 0.4–1.2)
GFR calc non Af Amer: 41 mL/min — ABNORMAL LOW
Glucose, Bld: 90 mg/dL (ref 70–99)
Potassium: 4 meq/L (ref 3.5–5.1)
Sodium: 142 meq/L (ref 135–145)

## 2011-01-19 LAB — HEPARIN LEVEL (UNFRACTIONATED)
Heparin Unfractionated: 0.3 IU/mL (ref 0.30–0.70)
Heparin Unfractionated: 0.71 IU/mL — ABNORMAL HIGH (ref 0.30–0.70)

## 2011-01-19 LAB — MAGNESIUM
Magnesium: 2.1 mg/dL (ref 1.5–2.5)
Magnesium: 2.8 mg/dL — ABNORMAL HIGH (ref 1.5–2.5)
Magnesium: 2.8 mg/dL — ABNORMAL HIGH (ref 1.5–2.5)
Magnesium: 3.2 mg/dL — ABNORMAL HIGH (ref 1.5–2.5)

## 2011-01-19 LAB — CROSSMATCH: Antibody Screen: NEGATIVE

## 2011-01-19 LAB — APTT: aPTT: 35 seconds (ref 24–37)

## 2011-01-19 LAB — CREATININE, SERUM
Creatinine, Ser: 1.31 mg/dL — ABNORMAL HIGH (ref 0.4–1.2)
Creatinine, Ser: 2.1 mg/dL — ABNORMAL HIGH (ref 0.4–1.2)
GFR calc Af Amer: 27 mL/min — ABNORMAL LOW (ref >60)
GFR calc Af Amer: 47 mL/min — ABNORMAL LOW (ref >60)
GFR calc non Af Amer: 39 mL/min — ABNORMAL LOW (ref >60)

## 2011-01-19 LAB — HEPARIN INDUCED THROMBOCYTOPENIA PNL: Heparin Induced Plt Ab: NEGATIVE

## 2011-01-19 LAB — MRSA PCR SCREENING: MRSA by PCR: NEGATIVE

## 2011-01-22 LAB — BASIC METABOLIC PANEL
CO2: 27 mEq/L (ref 19–32)
Chloride: 106 mEq/L (ref 96–112)
GFR calc Af Amer: 47 mL/min — ABNORMAL LOW (ref >60)
Sodium: 140 mEq/L (ref 135–145)

## 2011-01-22 LAB — DIFFERENTIAL
Basophils Relative: 1 % (ref 0–1)
Eosinophils Absolute: 0.1 10*3/uL (ref 0.0–0.7)
Eosinophils Absolute: 0.2 10*3/uL (ref 0.0–0.7)
Eosinophils Relative: 4 % (ref 0–5)
Lymphs Abs: 0.7 10*3/uL (ref 0.7–4.0)
Monocytes Absolute: 0.4 10*3/uL (ref 0.1–1.0)
Monocytes Relative: 14 % — ABNORMAL HIGH (ref 3–12)

## 2011-01-22 LAB — COMPREHENSIVE METABOLIC PANEL
ALT: 17 U/L (ref 0–35)
AST: 42 U/L — ABNORMAL HIGH (ref 0–37)
CO2: 26 mEq/L (ref 19–32)
Calcium: 9.5 mg/dL (ref 8.4–10.5)
Chloride: 105 mEq/L (ref 96–112)
GFR calc Af Amer: 45 mL/min — ABNORMAL LOW (ref >60)
GFR calc non Af Amer: 37 mL/min — ABNORMAL LOW (ref >60)
Potassium: 4.6 mEq/L (ref 3.5–5.1)
Sodium: 137 mEq/L (ref 135–145)

## 2011-01-22 LAB — POCT CARDIAC MARKERS
CKMB, poc: <1 ng/mL — ABNORMAL LOW (ref 1.0–8.0)
Myoglobin, poc: 88.6 ng/mL (ref 12–200)
Troponin i, poc: <0.05 ng/mL (ref 0.00–0.09)

## 2011-01-22 LAB — PROTIME-INR
INR: 3.4 — ABNORMAL HIGH (ref 0.00–1.49)
Prothrombin Time: 36.8 s — ABNORMAL HIGH (ref 11.6–15.2)

## 2011-01-22 LAB — CBC
HCT: 28.6 % — ABNORMAL LOW (ref 36.0–46.0)
Hemoglobin: 9.8 g/dL — ABNORMAL LOW (ref 12.0–15.0)
MCHC: 34 g/dL (ref 30.0–36.0)
MCHC: 34.2 g/dL (ref 30.0–36.0)
MCV: 88.1 fL (ref 78.0–100.0)
Platelets: 144 10*3/uL — ABNORMAL LOW (ref 150–400)
RBC: 3.25 MIL/uL — ABNORMAL LOW (ref 3.87–5.11)
RBC: 3.38 MIL/uL — ABNORMAL LOW (ref 3.87–5.11)
RDW: 23.3 % — ABNORMAL HIGH (ref 11.5–15.5)
WBC: 3 10*3/uL — ABNORMAL LOW (ref 4.0–10.5)
WBC: 3.4 10*3/uL — ABNORMAL LOW (ref 4.0–10.5)

## 2011-01-22 LAB — CK TOTAL AND CKMB (NOT AT ARMC)
Relative Index: 1.5 (ref 0.0–2.5)
Total CK: 110 U/L (ref 7–177)

## 2011-01-22 LAB — LIPASE, BLOOD: Lipase: 23 U/L (ref 11–59)

## 2011-01-22 LAB — CARDIAC PANEL(CRET KIN+CKTOT+MB+TROPI): Troponin I: 0.03 ng/mL (ref 0.00–0.06)

## 2011-01-28 LAB — CBC
HCT: 26.9 % — ABNORMAL LOW (ref 36.0–46.0)
HCT: 29.1 % — ABNORMAL LOW (ref 36.0–46.0)
Hemoglobin: 9.1 g/dL — ABNORMAL LOW (ref 12.0–15.0)
Hemoglobin: 9.4 g/dL — ABNORMAL LOW (ref 12.0–15.0)
MCHC: 31.8 g/dL (ref 30.0–36.0)
MCHC: 32.3 g/dL (ref 30.0–36.0)
MCHC: 32.5 g/dL (ref 30.0–36.0)
MCV: 78.5 fL (ref 78.0–100.0)
MCV: 80 fL (ref 78.0–100.0)
MCV: 81.6 fL (ref 78.0–100.0)
Platelets: 226 10*3/uL (ref 150–400)
RBC: 3.3 MIL/uL — ABNORMAL LOW (ref 3.87–5.11)
RBC: 3.42 MIL/uL — ABNORMAL LOW (ref 3.87–5.11)
RBC: 3.64 MIL/uL — ABNORMAL LOW (ref 3.87–5.11)
RDW: 18.4 % — ABNORMAL HIGH (ref 11.5–15.5)
RDW: 18.6 % — ABNORMAL HIGH (ref 11.5–15.5)
RDW: 20 % — ABNORMAL HIGH (ref 11.5–15.5)
WBC: 4.1 10*3/uL (ref 4.0–10.5)
WBC: 8 10*3/uL (ref 4.0–10.5)

## 2011-01-28 LAB — COMPREHENSIVE METABOLIC PANEL
AST: 31 U/L (ref 0–37)
Albumin: 3.5 g/dL (ref 3.5–5.2)
Calcium: 9.2 mg/dL (ref 8.4–10.5)
Chloride: 106 mEq/L (ref 96–112)
Creatinine, Ser: 1.59 mg/dL — ABNORMAL HIGH (ref 0.4–1.2)
GFR calc Af Amer: 38 mL/min — ABNORMAL LOW (ref >60)
Total Bilirubin: 0.7 mg/dL (ref 0.3–1.2)
Total Protein: 7 g/dL (ref 6.0–8.3)

## 2011-01-28 LAB — BASIC METABOLIC PANEL
BUN: 18 mg/dL (ref 6–23)
BUN: 20 mg/dL (ref 6–23)
CO2: 27 mEq/L (ref 19–32)
CO2: 29 mEq/L (ref 19–32)
CO2: 29 mEq/L (ref 19–32)
Calcium: 10.4 mg/dL (ref 8.4–10.5)
Calcium: 9.5 mg/dL (ref 8.4–10.5)
Chloride: 104 mEq/L (ref 96–112)
Chloride: 104 mEq/L (ref 96–112)
Chloride: 106 mEq/L (ref 96–112)
Creatinine, Ser: 1.39 mg/dL — ABNORMAL HIGH (ref 0.4–1.2)
Creatinine, Ser: 1.46 mg/dL — ABNORMAL HIGH (ref 0.4–1.2)
GFR calc Af Amer: 41 mL/min — ABNORMAL LOW (ref >60)
GFR calc Af Amer: 44 mL/min — ABNORMAL LOW (ref >60)
GFR calc non Af Amer: 37 mL/min — ABNORMAL LOW (ref >60)
Glucose, Bld: 102 mg/dL — ABNORMAL HIGH (ref 70–99)
Glucose, Bld: 112 mg/dL — ABNORMAL HIGH (ref 70–99)
Potassium: 3.9 mEq/L (ref 3.5–5.1)
Potassium: 4.1 mEq/L (ref 3.5–5.1)
Sodium: 138 mEq/L (ref 135–145)
Sodium: 142 mEq/L (ref 135–145)

## 2011-01-28 LAB — DIFFERENTIAL
Basophils Absolute: 0 10*3/uL (ref 0.0–0.1)
Basophils Relative: 0 % (ref 0–1)
Neutro Abs: 5.2 10*3/uL (ref 1.7–7.7)
Neutrophils Relative %: 88 % — ABNORMAL HIGH (ref 43–77)

## 2011-01-28 LAB — CROSSMATCH: ABO/RH(D): O POS

## 2011-01-28 LAB — PROTIME-INR
INR: 2.9 — ABNORMAL HIGH (ref 0.00–1.49)
INR: 3.6 — ABNORMAL HIGH (ref 0.00–1.49)
INR: 4.1 — ABNORMAL HIGH (ref 0.00–1.49)
Prothrombin Time: 38.5 seconds — ABNORMAL HIGH (ref 11.6–15.2)
Prothrombin Time: 39.3 seconds — ABNORMAL HIGH (ref 11.6–15.2)

## 2011-01-28 LAB — POCT CARDIAC MARKERS

## 2011-01-28 LAB — APTT: aPTT: 42 seconds — ABNORMAL HIGH (ref 24–37)

## 2011-01-28 LAB — BRAIN NATRIURETIC PEPTIDE: Pro B Natriuretic peptide (BNP): 303 pg/mL — ABNORMAL HIGH (ref 0.0–100.0)

## 2011-01-28 LAB — TSH: TSH: 2.153 u[IU]/mL (ref 0.350–4.500)

## 2011-01-29 LAB — CBC
HCT: 24.2 % — ABNORMAL LOW (ref 36.0–46.0)
HCT: 24.3 % — ABNORMAL LOW (ref 36.0–46.0)
HCT: 28.1 % — ABNORMAL LOW (ref 36.0–46.0)
HCT: 30.6 % — ABNORMAL LOW (ref 36.0–46.0)
HCT: 32.9 % — ABNORMAL LOW (ref 36.0–46.0)
HCT: 33.2 % — ABNORMAL LOW (ref 36.0–46.0)
HCT: 33.7 % — ABNORMAL LOW (ref 36.0–46.0)
Hemoglobin: 10.2 g/dL — ABNORMAL LOW (ref 12.0–15.0)
Hemoglobin: 10.3 g/dL — ABNORMAL LOW (ref 12.0–15.0)
Hemoglobin: 10.5 g/dL — ABNORMAL LOW (ref 12.0–15.0)
Hemoglobin: 11.1 g/dL — ABNORMAL LOW (ref 12.0–15.0)
Hemoglobin: 8 g/dL — ABNORMAL LOW (ref 12.0–15.0)
Hemoglobin: 8 g/dL — ABNORMAL LOW (ref 12.0–15.0)
MCHC: 32.8 g/dL (ref 30.0–36.0)
MCHC: 33.3 g/dL (ref 30.0–36.0)
MCHC: 33.4 g/dL (ref 30.0–36.0)
MCV: 78.8 fL (ref 78.0–100.0)
MCV: 79 fL (ref 78.0–100.0)
MCV: 79.3 fL (ref 78.0–100.0)
MCV: 80.2 fL (ref 78.0–100.0)
MCV: 81 fL (ref 78.0–100.0)
MCV: 81 fL (ref 78.0–100.0)
MCV: 81.1 fL (ref 78.0–100.0)
Platelets: 166 10*3/uL (ref 150–400)
Platelets: 168 10*3/uL (ref 150–400)
Platelets: 174 10*3/uL (ref 150–400)
Platelets: 182 10*3/uL (ref 150–400)
Platelets: 190 10*3/uL (ref 150–400)
RBC: 3.76 MIL/uL — ABNORMAL LOW (ref 3.87–5.11)
RBC: 3.79 MIL/uL — ABNORMAL LOW (ref 3.87–5.11)
RBC: 3.81 MIL/uL — ABNORMAL LOW (ref 3.87–5.11)
RBC: 3.94 MIL/uL (ref 3.87–5.11)
RBC: 4.06 MIL/uL (ref 3.87–5.11)
RBC: 4.06 MIL/uL (ref 3.87–5.11)
RDW: 17.3 % — ABNORMAL HIGH (ref 11.5–15.5)
RDW: 17.3 % — ABNORMAL HIGH (ref 11.5–15.5)
RDW: 18 % — ABNORMAL HIGH (ref 11.5–15.5)
RDW: 18.4 % — ABNORMAL HIGH (ref 11.5–15.5)
RDW: 18.8 % — ABNORMAL HIGH (ref 11.5–15.5)
WBC: 3.2 10*3/uL — ABNORMAL LOW (ref 4.0–10.5)
WBC: 3.7 10*3/uL — ABNORMAL LOW (ref 4.0–10.5)
WBC: 3.9 10*3/uL — ABNORMAL LOW (ref 4.0–10.5)
WBC: 3.9 10*3/uL — ABNORMAL LOW (ref 4.0–10.5)
WBC: 3.9 10*3/uL — ABNORMAL LOW (ref 4.0–10.5)
WBC: 4.3 10*3/uL (ref 4.0–10.5)
WBC: 4.3 10*3/uL (ref 4.0–10.5)

## 2011-01-29 LAB — BASIC METABOLIC PANEL
BUN: 15 mg/dL (ref 6–23)
BUN: 20 mg/dL (ref 6–23)
CO2: 26 mEq/L (ref 19–32)
CO2: 28 mEq/L (ref 19–32)
CO2: 29 mEq/L (ref 19–32)
CO2: 31 mEq/L (ref 19–32)
Calcium: 9.7 mg/dL (ref 8.4–10.5)
Chloride: 103 mEq/L (ref 96–112)
Chloride: 103 mEq/L (ref 96–112)
Chloride: 103 mEq/L (ref 96–112)
Chloride: 104 mEq/L (ref 96–112)
Chloride: 107 mEq/L (ref 96–112)
Creatinine, Ser: 1.99 mg/dL — ABNORMAL HIGH (ref 0.4–1.2)
GFR calc Af Amer: 29 mL/min — ABNORMAL LOW (ref >60)
GFR calc Af Amer: 36 mL/min — ABNORMAL LOW (ref >60)
GFR calc Af Amer: 38 mL/min — ABNORMAL LOW (ref >60)
GFR calc non Af Amer: 34 mL/min — ABNORMAL LOW (ref >60)
Glucose, Bld: 79 mg/dL (ref 70–99)
Glucose, Bld: 83 mg/dL (ref 70–99)
Potassium: 3.7 mEq/L (ref 3.5–5.1)
Potassium: 3.8 mEq/L (ref 3.5–5.1)
Potassium: 4.1 mEq/L (ref 3.5–5.1)
Potassium: 4.2 mEq/L (ref 3.5–5.1)
Sodium: 139 mEq/L (ref 135–145)
Sodium: 139 mEq/L (ref 135–145)
Sodium: 140 mEq/L (ref 135–145)
Sodium: 140 mEq/L (ref 135–145)

## 2011-01-29 LAB — TYPE AND SCREEN: Antibody Screen: NEGATIVE

## 2011-01-29 LAB — DIFFERENTIAL
Basophils Absolute: 0 10*3/uL (ref 0.0–0.1)
Eosinophils Absolute: 0.1 10*3/uL (ref 0.0–0.7)
Eosinophils Absolute: 0.1 10*3/uL (ref 0.0–0.7)
Eosinophils Relative: 2 % (ref 0–5)
Eosinophils Relative: 2 % (ref 0–5)
Lymphocytes Relative: 7 % — ABNORMAL LOW (ref 12–46)
Lymphocytes Relative: 9 % — ABNORMAL LOW (ref 12–46)
Lymphs Abs: 0.3 10*3/uL — ABNORMAL LOW (ref 0.7–4.0)
Monocytes Absolute: 0.3 10*3/uL (ref 0.1–1.0)
Monocytes Absolute: 0.3 10*3/uL (ref 0.1–1.0)

## 2011-01-29 LAB — PROTEIN ELECTROPHORESIS, SERUM
Alpha-1-Globulin: 4.6 % (ref 2.9–4.9)
Gamma Globulin: 21.6 % — ABNORMAL HIGH (ref 11.1–18.8)
M-Spike, %: NOT DETECTED g/dL
Total Protein ELP: 7.1 g/dL (ref 6.0–8.3)

## 2011-01-29 LAB — UIFE/LIGHT CHAINS/TP QN, 24-HR UR
Albumin, U: DETECTED
Alpha 2, Urine: NOT DETECTED
Beta, Urine: NOT DETECTED
Gamma Globulin, Urine: NOT DETECTED
Total Protein, Urine: 6.4 mg/dL

## 2011-01-29 LAB — PROTIME-INR
INR: 1.5 (ref 0.00–1.49)
INR: 2.1 — ABNORMAL HIGH (ref 0.00–1.49)
Prothrombin Time: 24.7 seconds — ABNORMAL HIGH (ref 11.6–15.2)
Prothrombin Time: 45.6 seconds — ABNORMAL HIGH (ref 11.6–15.2)

## 2011-02-26 NOTE — Discharge Summary (Signed)
[name], [name]                  ACCOUNT NO.:  [id number]   MEDICAL RECORD NO.:  [id number]          PATIENT TYPE:  INP   LOCATION:  4733                         FACILITY:  [facility]   PHYSICIAN:  Savannah Belling, MD      DATE OF BIRTH:  07/21/1923   DATE OF ADMISSION:  01/03/2008  DATE OF DISCHARGE:  01/08/2008                               DISCHARGE SUMMARY   PRIMARY CARE PHYSICIAN:  Dr. Cleta [name] at [facility] in  [place].   DISCHARGE MEDICATIONS:  1. Azathioprine 25 mg p.o. daily.  2. Colchicine 0.6 mg p.o. daily.  3. Diltiazem 240 mg p.o. daily.  4. Nasonex 2 sprays daily.  5. Folic acid 2 mg p.o. daily.  6. Levothyroxine 75 mcg p.o. daily.  7. Losartan 100 mg p.o. daily.  8. Maalox as needed.  9. Nexium 40 mg p.o. daily.  10.Combivent as needed.  11.Warfarin 5 mg p.o. on Monday, Tuesday, Thursday, Friday, and      Saturday, and 2.5 mg on Wednesday and Sunday.   The patient's goal INR is 2.5 to 3.5 for mechanical heart valve.   DISCHARGE DIAGNOSES:  1. Gastrointestinal bleed.  2. Anemia.  3. Anticoagulation for mechanical valve.  4. Hypertension.  5. Acute renal failure.  6. Chest pain, noncardiac.  7. Hypothyroidism.  8. Autoimmune hepatitis.  9. Gout.  10.Chronic obstructive pulmonary disease.  11.Gastroesophageal reflux disease.  12.Allergic rhinitis.   PROCEDURES:  Chest x-ray on January 03, 2008, showed cardiomegaly with no  pulmonary edema.  Upper endoscopy on January 05, 2008, was normal.  Small  bowel followthrough on January 06, 2008, normal.   CONSULTS:  GI and hematology.   LABS:  On January 03, 2008, the patient had a white blood count of 5.3,  hemoglobin 7.8, hematocrit 23.6, and platelets 213.  B-natriuretic  peptide 311, INR 3.9 point of care, cardiac markers negative and x2 at  point of care and negative x2 thereafter.  The admission creatinine was  2.3.  Fecal occult blood positive.  TSH 1.763, uric acid 7.4, LDH 489,  ferritin 97, and  reticulocytes 1.5.  On day of discharge, the patient  has an INR of 3.0, a CBC that shows a white count of 5.2, hemoglobin  10.8, hematocrit 32.0, and platelets 183.  Basic metabolic panel on day  of discharge is remarkable for a creatinine of 1.32 and is otherwise  within normal limits.   BRIEF HOSPITAL COURSE:  This is an 77 year old female who is admitted  with a GI bleed, anemia, and chest pain.  1. GI bleed.  GI was consulted and is noted small bowel followthrough      and EGD were normal as above.  The patient had melanotic stools and      was FOBT positive during this admission.  The patient also had a      normal colonoscopy in 2007.  Plans are for a capsule endoscopy on      January 15, 2008, as an outpatient.  This is to evaluate for small      bowel  source of bleeding.  2. Anemia.  This is likely due to the GI bleed, although the patient      has a long history of anemia and is on a marrow suppressive drug      for autoimmune hepatitis, namely azathioprine.  She was admitted      with a hemoglobin of 7.8, which increased to 10.3 after 2 units of      packed red blood cells.  Hemoglobin dropped to 9.8 and 1 more unit      of packed red blood cells was transfused.  The patient was      maintained at a hemoglobin in the 10.8 to 10.9 range for more than      24 hours with no signs of loss after that.  The patient had her      folic acid increased to 2 mg p.o. daily and was started on iron      supplement 150 mg p.o. b.i.d.  The iron supplement, however, is      held at this time as the patient will be going to capsule endoscopy      and should not have this.  Dr. Darnelle [name] from the hematology was      consulted and he is considering changing the patient from      azathioprine to another agent for her autoimmune hepatitis given      the patient's inadequate reticulocyte count.  3. Anticoagulation for mechanical valve.  The patient was maintained      on her home warfarin and was  maintained in a therapeutic range,      ranging from a low of 2.9 to a high of 3.9.  She was      supratherapeutic on admission and is discharged at goal of 3.0.      Her goal INR is 2.5 to 3.5 for this mechanical valve.  4. Hypertension.  This was stable throughout the admission and the      patient was maintained on her home doses of losartan and diltiazem.  5. Acute renal failure.  The patient was admitted with a creatinine of      2.3 and this trended down.  She is currently at 1.32 after      rehydration.  6. Chest pain.  This may have represented some strain or stress rather      from the anemia.  Cardiac enzymes were negative x3 during this      admission.  She was transfused to maintain a hemoglobin above 10      given her cardiac issues.  7. Hypothyroidism.  TSH was within normal limits during this admission      and she was kept on her home dose of levothyroxine.  8. Autoimmune hepatitis.  Liver function tests were within normal      during this admission and azathioprine was continued daily.  9. Gout.  Stable.  Colchicine 0.6 mg p.o. daily continued.  10.COPD.  This was stable throughout the admission with no p.r.n. use      of albuterol.  The patient's home O2 requirement is 2 liters and      she intermittently used oxygen during this admission.  11.GERD.  The patient was treated with pantoprazole 40 mg p.o. b.i.d.      and is discharged on Nexium 40 mg p.o. b.i.d.  12.Allergic rhinitis.  The patient was given Flonase during this      admission and is discharged on her home Nasonex.  FOLLOWUP ISSUES:  1. The patient should have her INR checked to ensure that she is in      her therapeutic range of 2.5 to 3.5.  2. She should have her stools checked daily for blood or black color.  3. She should have a fecal occult blood test at her primary care visit      next week.  She should also have a hemoglobin check at that time.      Transfusion thresholds for [name] is a  hemoglobin of 10.  4. Please hold iron supplement until capsule endoscopy on January 15, 2008.   FOLLOWUP APPOINTMENTS:  She is scheduled with Dr. Bosie [name] at [facility]  on January 25, 2008, at 10:45 a.m.  She is scheduled with Dr. Cleta [name] at  [facility] on January 12, 2008, at 8:15 a.m.  She has  a capsule endoscopy scheduled at [facility] on January 15, 2008.      Savannah Belling, MD  Electronically Signed     MO/MEDQ  D:  01/08/2008  T:  01/09/2008  Job:  [id number]   cc:   Valentino Hue. Magrinat, M.D.  Maylon Peppers Savannah Alberts, MD  Shirley Friar, MD

## 2011-02-26 NOTE — H&P (Signed)
[name], [name]                  ACCOUNT NO.:  [id number]   MEDICAL RECORD NO.:  [id number]          PATIENT TYPE:  INP   LOCATION:  6714                         FACILITY:  [facility]   PHYSICIAN:  Paula Compton, MD        DATE OF BIRTH:  03-09-1923   DATE OF ADMISSION:  07/27/2008  DATE OF DISCHARGE:                              HISTORY & PHYSICAL   Attending:  Dr Paula Compton   CHIEF COMPLAINT:  Weakness with shortness of breath when walking short  distance.   HISTORY OF PRESENT ILLNESS:  The patient is an 77 year old African  American female who went to [facility] this morning due to  weakness and shortness of breath that started during the past weekend.  Hemoglobin was drawn and level was 6.8.  The patient also had a positive  stool Hemoccult.  She was sent here to [facility] as a direct admit to  the [facility].  The patient had significant past  medical history of GI bleed in March 2009, and is on chronic Coumadin  therapy for AFib in presence of St. Jude mechanical valve.  The patient  states that she has had dark stools recently.  Also, reports problems  with some constipation and diarrhea, positive shortness of breath  reported and denies chest pain.  Her SOB has been worse over past 3  days, worse w/ exertion.   REVIEW OF SYMPTOMS:  Negative except for those mentioned in the above  HPI.   PAST MEDICAL HISTORY:  1. CAD.  2. Childhood rheumatic fever, which required a St. Jude mitral valve.  3. AFib.  4. Left atrial enlargement.  5. Dual-chamber pacemaker.  6. CHF.  7. Pulmonary hypertension.  8. Anemia.  9. Autoimmune hepatitis.  10.CVA in 2002 with multiple TIAs.  11.Hypothyroidism.  12.Hyperlipidemia.  13.Gallstones.  14.GERD.  15.COPD.   OTHER MEDICAL PROBLEMS:  1. Bilateral renal cyst.  2. Osteoarthritis.  3. Gout.  4. Hiatal hernia.  5. Renal insufficiency.   ALLERGIES:  1. ADVIL.  2. NOVOCAIN.  3. SULFONAMIDES.  4.  TOPROL-XL.  5. RELAFEN.  6. Prudy Feeler.   MEDICATIONS AT HOME:  1. Warfarin 5 mg daily except for Sunday, on Sunday takes 2.5 mg of      warfarin.  2. Combivent 2 puffs 3 times a day p.r.n.  3. Oxygen 2 L nasal cannula p.r.n.  4. Albuterol q.4 h. p.r.n.  5. Nasonex 2 puffs each nostril p.r.n.  6. Synthroid 75 mcg p.o. daily.  7. Folic acid 2 mg p.o. daily.  8. Cardizem ER 240 mg p.o. daily.  9. Cozaar 100 mg p.o. daily.  10.Azathioprine 25 mg p.o. daily.  11.Nexium 40 mg p.o. daily.  12.Colchicine 0.6 mg p.o. daily.  13.Nu-Iron 150 mg p.o. b.i.d.  14.Lasix 20 mg p.o. daily.  15.Potassium chloride 10 mEq daily.  16.Tylenol p.r.n.   FAMILY HISTORY:  Diabetes in brother and CVA in mother.   SOCIAL HISTORY:  Lives alone, volunteers at [employer].  No EtOH.  No smoking.  No drugs.  Smoking history  x10 years where she was a young  teenager.   PHYSICAL EXAMINATION:  VITAL SIGNS:  Temp 97, heart rate 84, respiratory  rate 18, blood pressure 99/47, and pulse ox 98% on room air.  GENERAL:  No acute distress.  Resting in bed.  CARDIOVASCULAR:  S1 and S2 present.  Positive systolic ejection murmur.  PULMONARY:  Clear to auscultation.  Good air movement.  GI:  Positive bowel sounds.  No pain, no rebound, no guarding.  EXTREMITIES:  No edema.  2+ pedal pulses.  NEURO:  Alert and oriented x3.  Pupils are equal, round, and reactive to  light and accommodation.  HEENT:  No scleral pallor.   LABORATORY DATA:  From [facility], white blood cells 5.6,  hemoglobin 6.8, hematocrit 23, platelets 235, positive rectal Hemoccult  at [facility].   ASSESSMENT:  The patient is an 77 year old female with gastrointestinal  bleed and anemia.   PLAN:  1. GI bleed.  Positive Hemoccult at [facility] admitted in      March 2009 for same, unable to find source of bleeding at that      time.  GI consulted in March 2009.  Upper endoscopy and small bowel      followthrough were normal.  GI  stated that they suspect AVM of      small bowel, although this is not confirmed.  Coumadin held for      today.  Primary GI consulted and EGD scheduled for tomorrow.  q6hr      CBC or H/H.  2. Anemia.  The patient's hemoglobin is 6.8.  We will transfuse 2      units of packed red blood cells now.  We will draw PT, PTT, INR,      CBC, stat before transfusion. q 6hr h/h  3. Hypertension.  Blood pressure 99/47, nursing to obtain orthostatic.      We will follow her blood pressures.  4. AFib, chronic.  We will hold Cardizem for now due to decreased      blood pressure, as soon as blood pressure within normal limits, we      will consider resuming regular dose.  5. St. Jude mechanical valve, we will resume Coumadin as soon as      anemia resolved and bleeding risk decreased.  6. CHF.  We will watch IV fluids closely and follow volume status with      physical exam.  We will hold IV fluids while eating.  We will give      small amount 75 mL per hour D5 half-normal saline when patient      n.p.o. after midnight.  7. COPD.  We will continue home meds and p.r.n. inhalers.  8. Hypothyroidism.  We will continue Synthroid.  9. Autoimmune hepatitis.  We will continue azathioprine.  10.Gout.  We will continue colchicine FEN/GI, heart healthy diet,      n.p.o. after midnight for EGD tomorrow, prophylaxis, SCDs, and p.o.      diet per above.      Johney Maine, M.D.  Electronically Signed      Paula Compton, MD  Electronically Signed    JT/MEDQ  D:  07/27/2008  T:  07/28/2008  Job:  [id number]

## 2011-02-26 NOTE — H&P (Signed)
[name], [name]                  ACCOUNT NO.:  [id number]   MEDICAL RECORD NO.:  [id number]          PATIENT TYPE:  INP   LOCATION:  1826                         FACILITY:  [facility]   PHYSICIAN:  Pearlean Brownie, M.D.DATE OF BIRTH:  [birth year]   DATE OF ADMISSION:  01/03/2008  DATE OF DISCHARGE:                              HISTORY & PHYSICAL   CHIEF COMPLAINT:  Exertional shortness of breath with shoulder pain.   PRIMARY CARE PHYSICIAN:  Brett Canales A. Cleta Alberts, M.D. at [facility].   HISTORY OF PRESENT ILLNESS:  This is an 77 year old female with  exertional shortness of breath and shoulder pain lasting about five  minutes.  The pain is relieved for two hours and then returns.  The  patient denies fevers, chills, cough, edema, and myalgia.  The patient  admits to stable orthopnea which requires three pillows.  The patient  was discharged in March of 2009 after being treated for a lower GI  bleed.  She has been using home oxygen for three days with increased  dyspnea on exertion, bilateral shoulder pain, and left-sided pain.  The  pain is gradually relieved spontaneously.  She denies nausea, sweating,  or chest pressure.   REVIEW OF SYSTEMS:  Positive for occasional chills, nonproductive cough.  The pain is also relieved with Pepcid.  The patient denies bright red  blood per rectum, but admits to melena today.   PAST MEDICAL HISTORY:  1. Coronary artery disease.  2. Childhood rheumatoid fever which required a mitral valve      replacement with a St. Jude mechanical valve.  3. Atrial fibrillation.  4. Left atrial enlargement.  5. Dual chamber pacemaker.  6. Congestive heart failure.  7. Pulmonary hypertension.  8. Anemia.  9. Autoimmune hepatitis.  10.CVA in 2002 with multiple TIA's.  11.Hypothyroidism.  12.Hyperlipidemia.  13.Gallstones.  14.GERD.  15.COPD.  16.Bilateral renal cysts.  17.Osteoarthritis.  18.Gout.  19.Hiatal hernia.  20.Renal insufficiency.   ALLERGIES:  1. ADVIL.  2. NOVOCAIN.  3. SULFONAMIDES.  4. TOPROL XL.  5. RELAFEN.  6. Prudy Feeler.   MEDICATIONS:  1. Warfarin 5 mg p.o. every Monday, Tuesday, Thursday, Friday, and      Saturday, and 2.5 mg p.o. every Wednesday and Sunday.  2. Combivent two puffs p.r.n.  3. Oxygen p.r.n.  4. Albuterol p.r.n.  5. Nasonex two puffs each nostril p.r.n.  6. Synthroid 75 mcg p.o. daily.  7. Folic acid 1 mg p.o. daily.  8. Cardizem 240 mg p.o. daily.  9. Cozaar 100 mg p.o. daily.  10.Azathioprine 25 mg p.o. daily.  11.Nexium 40 mg p.o. daily.  12.Colchicine 0.6 mg p.o. daily.   FAMILY HISTORY:  Significant for diabetes mellitus, hypertension, and  coronary artery disease.   SOCIAL HISTORY:  The patient lives alone.  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   PHYSICAL EXAMINATION:  VITAL SIGNS:  Temperature 98.1, heart rate 69,  respirations 17, blood pressure 124 to 140 over 55 to 72.  Oxygen  saturation is 98% on 2 liters.  GENERAL:  Alert and  oriented in no acute distress.  CARDIOVASCULAR:  Regular rate and rhythm, 3.6 systolic murmur.  LUNGS:  Clear to auscultation bilaterally with normal work of breathing.  ABDOMEN:  Positive bowel sounds, nontender.  EXTREMITIES:  Nontender without edema.  HEENT:  Moist mucous membranes.   LABORATORY DATA:  Sodium 140, potassium 4.3, chloride 104, bicarb 22,  BUN 25, creatinine 2.3, glucose 93.  Baseline creatinine is 1.64 from  previous admission.  CBC; white blood cell count 5.3, hemoglobin 7.8,  hematocrit 23.6, platelets 213.  Last hemoglobin from previous admission  was 10.1.  BNP 311.  Point of care cardiac enzymes are negative x1, INR  is 3.9.   Chest x-ray shows cardiomegaly with no edema.  FOBT is positive.   ASSESSMENT:  This is an 77 year old female with chest pain and anemia.   1. Chest pain, possible stress secondary to anemia.  The patient has a      history of coronary artery  disease, point of care enzymes are      negative.  Cycle cardiac enzymes and check a thyroid stimulating      hormone in the morning.  2. Anemia.  The patient is with a history of lower gastrointestinal      bleed and now with melena.  This is consistent with a chronic      gastrointestinal bleed.  We will transfuse two units of packed red      blood cells and check a CBC in the morning.  Continue proton pump      inhibitor but change to b.i.d.  3. Mechanical valve.  Hold Warfarin given possible GI bleed.  Check      INR in the morning.  4. Autoimmune hepatitis, continue the azathioprine.  Check liver      functions in the morning.  5. Hypothyroidism.  Continue home Synthroid and check TSH in the      morning.  6. Gout.  Continue the home Colchicine.  7. Chronic obstructive pulmonary disease.  Continue the home      albuterol.  8. Gastroesophageal reflux disease.  Continue the home proton pump      inhibitor.  9. Prophylaxis.  Proton pump inhibitor and anticoagulation.   DISPOSITION:  Pending the chest pain and anemia workup.      Romero Belling, MD  Electronically Signed      Pearlean Brownie, M.D.  Electronically Signed    MO/MEDQ  D:  01/03/2008  T:  01/03/2008  Job:  [id number]

## 2011-02-26 NOTE — Consult Note (Signed)
[name], SCHMELTER                  ACCOUNT NO.:  [id number]   MEDICAL RECORD NO.:  [id number]          PATIENT TYPE:  INP   LOCATION:  2037                         FACILITY:  [facility]   PHYSICIAN:  Evelene Croon, M.D.     DATE OF BIRTH:  1923-09-19   DATE OF CONSULTATION:  05/31/2009  DATE OF DISCHARGE:                                 CONSULTATION   REFERRING PHYSICIAN:  Nicki Guadalajara, MD   REASON FOR CONSULTATION:  Critical aortic stenosis.   CLINICAL HISTORY:  I was asked by Dr. Tresa Endo to evaluate [name] for  consideration of redo sternotomy and aortic valve replacement.  She is a  frail 77 year old woman who has a history of St. Jude mitral valve  replacement by Dr. Kathlee Nations Trigt in 2001.  This was complicated by  perioperative stroke.  She has a history of sick sinus syndrome with  symptomatic bradycardia and paroxysmal atrial fibrillation and underwent  a permanent pacemaker placement in 2001.  She also had a history of left  atrial flutter and underwent an AV nodal ablation in the past.  She has  a known history of aortic stenosis.  Her last cardiac catheterization  had been performed in 2005 and at that time it showed mild 30% ostial  left main tapering.  She has had successive echocardiograms over the  past few years that had showed progressive aortic stenosis.  The most  recent echocardiogram was performed on May 18, 2009 and showed  critical aortic stenosis with an aortic valve area of 0.44 cm2 with a  mean gradient of 44 and a max gradient of 80 mmHg.  Ejection fraction  was 45 to 50%.  There was mild concentric left ventricular hypertrophy  with suggestion of impaired LV relaxation.  Her left atrium was severely  dilated.  The right atrium was mildly dilated.  She had a St. Jude  mechanical valve prosthesis in the mitral position with trace mitral  regurgitation.  There was moderate to severe tricuspid regurgitation.  Right ventricular systolic pressure was elevated  greater than 60 mmHg.  There was severe calcification of the aortic valve leaflets with severe  aortic valve stenosis.  There was mild-to-moderate aortic insufficiency.  The valve area had progressed from her previous echocardiograms.  She  underwent cardiac catheterization yesterday showing no significant  coronary artery disease.  PA pressure was 70/25 with a right ventricular  pressure of 70/9 with a right atrial pressure of 10, a wedge pressure of  20 and aortic pressure of 133/63.  It was not possible to cross the  aortic valve.  Post catheterization she noticed left upper extremity  severe weakness as well as a left facial droop with some difficulty  swallowing.  She had a history of stroke with a previous heart surgery  which she recovered from as well as multiple TIAs over the past few  years followed by Neurology.  She was seen by Neurology today and felt  that had a stroke.  CT scan of the head was pending.   REVIEW OF SYSTEMS:  On presentation;  GENERAL:  She denies any fever or  chills.  She has had some weight loss.  She denies change in her  appetite.  She has had fatigue.  EYES:  Negative.  ENT:  Negative.  ENDOCRINE:  She denies diabetes.  She has hypothyroidism.  CARDIOVASCULAR:  She denies any chest pain or pressure.  She does have  progressive exertional dyspnea over the past several months.  It has  gone to the point where she is short of breath just walking around her  house.  She denies PND or orthopnea.  She denies peripheral edema.  RESPIRATORY:  She denies cough and sputum production.  GI:  She denies  nausea, vomiting.  She denies melena and bright red blood per rectum.  She does have a previous history of some GI bleeding secondary to AVMs.  NEUROLOGICAL:  She denies any headaches.  She denies dizziness or  syncope.  She does have a history of TIAs and stroke as mentioned above.   ALLERGIES:  SULFA, XANAX, ADVIL, RELAFEN, NOVOCAIN, and TOPROL.   PAST MEDICAL  HISTORY:  Significant for:  1. St. Jude mitral valve replacement as mentioned above.  2. She has history of sick sinus syndrome and symptomatic bradycardia,      as well as paroxysmal atrial fibrillation status post permanent      pacemaker placement in 2001.  3. She has a history of AV nodal ablation for left atrial flutter in      the past.  4. She has a history of hypertension and hyperlipidemia.  5. She has a history of hypothyroidism.  6. She has a history of autoimmune hepatitis in the past.  7. She has a history of osteoarthritis.  8. She has a history of gout.  9. There is a questionable history of multiple myelomas in her chart.   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  She has a daughter,  [name] who is a health care [name].  She smoked 60 years ago and  quit.  She denies alcohol or drug use.   FAMILY HISTORY:  Insignificant.   CURRENT MEDICATIONS:  As noted in her MAR.   PHYSICAL EXAMINATION:  VITAL SIGNS:  Her blood pressure is 148/76, pulse  69 and regular, respiratory rate is 18 unlabored.  GENERAL:  She is a frail elderly woman in no distress.  HEENT:  Normocephalic and atraumatic.  Pupils are equal.  NECK:  Transmitted murmur to both sides of her neck.  Carotid pulses are  strong bilaterally.  There is no adenopathy or thyromegaly.  CARDIAC:  Regular rate and rhythm with a grade 3/6 harsh systolic murmur  of aortic stenosis.  I do not hear diastolic murmur.  There is an old  median sternotomy incision.  Her sternal wires are easily palpable  beneath the skin with minimal subcutaneous tissue.  LUNGS:  Crackles bilaterally.  ABDOMEN:  Active bowel sounds.  Abdomen is soft and nontender.  There  are no palpable masses or organomegaly.  EXTREMITIES:  No peripheral edema.  Pedal pulses are palpable  bilaterally.  SKIN:  Warm and dry.  NEUROLOGIC:  Alert and oriented.  She does  have flattening of her left  nasolabial fold and some drooling of left side of her mouth.  Her right  grip is weaker than the left.  Both lower extremities appear strong  bilateral.   LABORATORY EXAMINATION:  Normal electrolytes  with BUN of 11, creatinine  1.23, hemoglobin 9.0, hematocrit 26.4, platelet count 131,000.  INR is  1.3.  Chest x-ray is unremarkable.  CT scan of the head shows a stable  left cerebellar atrophy and lacunar infarcts.  There is an infarct in  the left cerebellum.   IMPRESSION:  Ms. Chaplin is a frail elderly 77 year old woman with  critical aortic stenosis with severe pulmonary hypertension and evidence  of right ventricular overload with elevated right ventricular systolic  pressure greater than 60 and moderate to severe tricuspid regurgitation.  I agree that her symptoms of progressive shortness of breath are most  likely due to her aortic stenosis.  The risk of redo sternotomy and  aortic valve replacement is very high in this patient given her age and  debilitation as well as her history of recurrent TIAs and now another  stroke.  I think she would be at very high risk for another stroke  perioperatively or other complications.  I really doubt that she would  be able to get back home and return to being independent driving her car  and working at [employer].  This I think is the most important  thing to her and I discussed all this with her and her daughter, Amiayah Giebel.  Unfortunately, I do not think there is any other good  treatment for her aortic stenosis.  I do not know whether she would be a  candidate for percutaneous aortic valve replacement and I do not think  this is being done anywhere in this area at the present time.  I told  her and her daughter that we would discuss this further.  I told them  that I would  not say no to surgery at this time but felt like the patient and her  daughter needed to completely understand what the risk are  and her  likely complicated course.  Also, I think it is imperative that she  recovers some from her current stroke and start ambulating before I  would consider cardiac surgery.      Evelene Croon, M.D.  Electronically Signed     BB/MEDQ  D:  05/31/2009  T:  06/01/2009  Job:  [id number]   cc:   Nicki Guadalajara, M.D.  [place] Cleta Alberts, M.D.

## 2011-02-26 NOTE — Op Note (Signed)
NAME:  [name], [name]                  ACCOUNT NO.:  [id number]   MEDICAL RECORD NO.:  [id number]          PATIENT TYPE:  INP   LOCATION:  3739                         FACILITY:  [facility]   PHYSICIAN:  John C. Madilyn Fireman, M.D.    DATE OF BIRTH:  05-22-1923   DATE OF PROCEDURE:  12/06/2008  DATE OF DISCHARGE:                               OPERATIVE REPORT   UNITS NUMBER:  [id number]   PROCEDURE:  Capsule endoscopy.   INDICATIONS FOR PROCEDURE:  Gastrointestinal bleeding of obscure origin.   FINDINGS:  The capsule was placed into the stomach endoscopically.  Esophageal visualization was suboptimal.   Stomach is normal without any signs of active bleeding.   Unusually slow gastric transit time of 6 hours and 10 minutes until this  capsule entered the duodenum.   Duodenum and visualized small bowel completely normal over the last one  hour and 50 minutes of the study.  The cecum was not reached, though  there was no visible evidence of obstruction.  Suspect cecum not reached  due to prolonged existence of capsule in the stomach with relatively  short period of time in the small bowel.   IMPRESSION:  1. Normal visualized small bowel, but study incomplete and cecum not      reached.  2. No evidence of active bleeding.   PLAN:  The patient will need a follow-up small bowel or possibly small-  bowel CT enterography to visualize the distal ileum.           ______________________________  Everardo All Madilyn Fireman, M.D.     JCH/MEDQ  D:  12/06/2008  T:  12/07/2008  Job:  [id number]   cc:   Petra Kuba, M.D.  Fax: [phone]   Graylin Shiver, M.D.  Fax: [phone]

## 2011-02-26 NOTE — Op Note (Signed)
NAME:  ZULEY, LUTTER                  ACCOUNT NO.:  [id number]   MEDICAL RECORD NO.:  [id number]          PATIENT TYPE:  INP   LOCATION:  3739                         FACILITY:  [facility]   PHYSICIAN:  Graylin Shiver, M.D.   DATE OF BIRTH:  15-Feb-1923   DATE OF PROCEDURE:  12/05/2008  DATE OF DISCHARGE:                               OPERATIVE REPORT   PROCEDURE:  Esophagogastroduodenoscopy for capsule endoscopy placement.   INDICATIONS FOR PROCEDURE:  Recurring chronic gastrointestinal bleeding  causing anemia.   Informed consent was obtained after explanation of the risks of  bleeding, infection, and perforation.   PREMEDICATION:  Fentanyl 50 mcg IV, Versed 4 mg IV.   PROCEDURE:  With the patient in the left lateral decubitus position, the  Pentax gastroscope was inserted into the oropharynx and passed into the  esophagus.  It was advanced down the esophagus then into the stomach and  into the duodenum.  The second portion and bulb of the duodenum looked  normal.  The stomach looked normal in its entirety.  The esophagus  looked normal in its entirety.  The scope was brought out.  The capsule  was assembled and attached to the end of the scope.  The scope with  capsule was then reinserted into the oropharynx and passed into the  esophagus.  It was advanced down into the duodenum where the capsule was  deployed, and the scope was then removed.  She tolerated the procedure  well without complications.   IMPRESSION:  Normal Esophagogastroduodenoscopy.  Capsule was placed into  the small intestine.           ______________________________  Graylin Shiver, M.D.     SFG/MEDQ  D:  12/05/2008  T:  12/06/2008  Job:  [id number]   cc:   Leighton Roach McDiarmid, M.D.

## 2011-02-26 NOTE — Discharge Summary (Signed)
NAMEJOURNEY, [name]                  ACCOUNT NO.:  [id number]   MEDICAL RECORD NO.:  [id number]          PATIENT TYPE:  INP   LOCATION:  3709                         FACILITY:  [facility]   PHYSICIAN:  Nestor Ramp, MD        DATE OF BIRTH:  12/16/1922   DATE OF ADMISSION:  10/30/2008  DATE OF DISCHARGE:  10/31/2008                               DISCHARGE SUMMARY   PRIMARY CARE PHYSICIAN:  Brett Canales A. Cleta Alberts, MD   DISCHARGE DIAGNOSES:  1. Gastrointestinal bleed causing anemia, which required transfusion.  2. Supratherapeutic INR.  3. St. Jude valve, on Coumadin.  4. Hypothyroidism.  5. Chronic obstructive pulmonary disease.  6. Congestive heart failure.  7. Gout.  8. Atrial fibrillation.   DISCHARGE MEDICATIONS:  1. Coumadin 5 mg on Sunday, Monday, Wednesday, Friday, and Saturday.  2. Coumadin 2.5 mg on Tuesdays and Thursdays.  3. Synthroid 0.75 mg daily.  4. Folic acid 2 mg 1 tablet p.o. daily.  5. Cardizem 240 mg daily.  6. Cozaar 100 mg daily.  7. Nu-Iron 150 mg twice a day.  8. Nexium 40 mg b.i.d.  9. Lasix 20 mg daily.  10.Potassium chloride 10 mg twice a day.  11.Colchicine 0.6 mg daily.  12.Combivent 2 puffs 3 times a day.  13.Albuterol 2 puffs 34 hours as needed for shortness of breath.  14.Albuterol 2 L per minute as needed for shortness of breath at home.  15.Nasonex 50 mcg nasally 2 sprays in each nostril daily.   DISCHARGE APPOINTMENTS:  The patient is to return to Dr. Cleta Alberts at [facility] on November 01, 2008, at 1:15 p.m., phone number is [phone].  Daughter did ask if she could possibly have a later appointment.  Daughter does not work at [place] and said that she might call and change  this to Thursday.  I advised her that this is fine as long as she calls  and lets [place] know in advance.  The patient also has an appointment  with Tresa Endo at [facility], phone number [phone],  this is on November 03, 2008, at 10 a.m. for an INR check.   DISCHARGE INSTRUCTIONS:  The patient is to be on a low-sodium heart  healthy diet and increase her activity slowly.  She is to change her  Coumadin dosing as indicated on her medication sheet.   DISCHARGE FOLLOWUP ISSUES:  The patient will need an INR checked on  November 03, 2008.  She will also need a CBC checked sometime this week  either by Dr. Cleta Alberts or at [facility] to make sure  her hemoglobin is stable.   LABORATORY DATA:  On admission, the patient's hemoglobin was 6.4, her  hematocrit was 19.5, white blood cells were 4.1, and platelets 226.  After 2 units of packed red blood cells, her hemoglobin subsequently  increased to 9.1, her white blood cell count was 8.0, hematocrit was  28.2, and her platelets were 201.  The patient's TSH was normal at  2.153.  Her BMET at the time of discharge was  normal except for a  creatinine of 1.38; however, this is around her baseline.  Her sodium  was 138, potassium 3.9, chloride 104, bicarb 28, glucose 96, BUN 18,  creatinine 1.38.  Her INR at the time of discharge on October 31, 2008,  was 3.6.  Her INR on admission was 4.1 on October 30, 2008.  Her  creatinine on admission was 1.59.  Chest x-ray on October 31, 2008,  showed post mitral valve replacement and dual-lead permanent cardiac  pacer.  The heart is mildly enlarged and there is mild vascular  congestion with small right effusion, no definite pneumonia.   REASON FOR HOSPITALIZATION:  The patient was hospitalized due to a low  hemoglobin and the need for her to be transfused.  She has been in the  hospital multiple times with many GI bleeds in the past.   HOSPITAL COURSE:  1. The patient was admitted after having a few bouts of bloody stools      at home and then a hemoglobin that was subsequently checked at      [facility] and found to be in the 6's.  The patient's      hemoglobin was checked here and found to be 6.4.  She was admitted      for her anemia  as well as to receive packed red blood cells.  She      did receive 2 units and her hemoglobin subsequently rose to 9.1.      The patient was feeling much better.  Denied any dizziness.  No      chest pain or shortness of breath at the time of discharge.  Also,      was not having any more bleeding.  The patient's INR when she came      in to see [place] was 4.1 and when she left was 3.6, which is close to      her target range of 2.5-3.5 with her St. Jude valve.      Gastroenterology was consulted and she was seen on the day of      discharge.  Their impression was that she had multiple medical      problems and recurrent gastrointestinal bleeding and probably her      supratherapeutic INR was the reason for her bleeding likely due to      angiodysplasia in the small bowel, and they decided not to pursue      endoscopic procedures with her at this time.  The patient will go      home.  As she is doing very well, her daughter is comfortable with      this and will follow up with Dr. Cleta Alberts at [place] as well as Tresa Endo at      [facility] to make sure her INR is checked and      her CBC is checked.  2. History of atrial fibrillation as well as St. Jude mitral valve.      The patient's Coumadin was initially held while she was here due to      a supratherapeutic INR.  On the day of discharge, it was very close      to normal at 3.6.  She will continue on a lower dose of Coumadin      than she was on previously and follow up to have her INR checked on      November 03, 2008.  3. CHF.  The patient did  not have any problems while she was here in      the hospital.  She was continued on her Cozaar as well as her      Lasix.  She was also continued on Cardizem.  4. Hypothyroidism.  The patient was continued on her home dose of      Synthroid.  Her TSH was checked here and found to be normal.  5. Gout.  The patient was continued on her home dose of colchicine 0.6      mg daily.  6.  COPD.  The patient did quite well.  Her Combivent was changed at      discharge to a scheduled dose of 3 times a day as she did not have      any scheduled COPD medications prior to leaving.  Could consider      putting her on inhaled steroid.  We will leave this out to her      primary care physician.  We will also send her home with albuterol      p.r.n.  7. Acute renal failure.  The patient did have little bit of acute      renal failure; however, this resolved with her 2 units of packed      red blood cells and seems to be at her baseline around 1.3      currently prior to leaving the hospital.      Alanda Amass, M.D.  Electronically Signed      Nestor Ramp, MD  Electronically Signed    JH/MEDQ  D:  11/01/2008  T:  11/01/2008  Job:  [id number]   cc:   Brett Canales A. Cleta Alberts, M.D.

## 2011-02-26 NOTE — H&P (Signed)
[name], [name]                  ACCOUNT NO.:  [id number]   MEDICAL RECORD NO.:  [id number]          PATIENT TYPE:  INP   LOCATION:  1832                         FACILITY:  [facility]   PHYSICIAN:  Paula Compton, MD        DATE OF BIRTH:  [birth year]   DATE OF ADMISSION:  02/18/2009  DATE OF DISCHARGE:                              HISTORY & PHYSICAL   PRIMARY CARE PHYSICIAN:  Brett Canales A. Cleta Alberts, M.D. at [facility].   CARDIOLOGIST:  Nicki Guadalajara, M.D. at [facility].   CHIEF COMPLAINT:  Chest pain.   HISTORY OF PRESENT ILLNESS:  This is an 77 year old female who was  brought to the emergency room by her daughter for right-sided chest pain  x4 days.  The pain is located on the right side of her chest behind her  pacemaker and it radiates to her back pain.  The patient had an  echocardiogram by [facility] done on Wednesday, May 5 and noticed some pain  when the tech was placing in the ultrasound probe over the sternal area  of her chest.  The pain is intermittent, graded at 9/10,  and lasting  various times.  Pain is not worse with positional change or deep  breaths.  The pain was relieved by 1/2 tablet of Ultram 50 mg.  The  patient described the pain as steady or hard, not heavy or sharp.  She  denies any cough, arm pain, nausea, vomiting, fever, chills, or  diaphoresis.  She denies any change to her bowels or any difficulty or  pain with voiding.  She also has an associate symptom of abdominal pain  that has been chronic for years.   PAST MEDICAL HISTORY:  1. Congestive heart failure with ejection fraction of 50-55% on      echocardiogram done on Feb 15, 2009,  also showed severely dilated      left atrium.  2. 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  3. Cecal AVM.  4. Atrial fibrillation with a St. Jude mitral valve, pacemaker.  5. Questionable multiple myeloma that has been cleared this year by      Dr. Ewing Schlein.  6. Hypothyroidism.  7. COPD.  8. Hypertension.  9. GERD.  10.Pulmonary hypertension.  11.Osteoarthritis.  12.Gout.   PAST SURGICAL HISTORY:  1. St. Jude mitral valve.  2. Dual-chamber pacemaker.  3. Bilateral knee arthroscopy.   ALLERGIES:  1. ADVIL which causes a rash.  2. TOPROL-XL  3. RELAFEN.  4. SULFA.  5. Prudy Feeler.  6. NOVOCAIN.   MEDICATIONS:  1. Cardizem 120 mg p.o. daily.  2. Cozaar 100 mg 1/2 tablet daily.  3. Colchicine 0.6 mg daily.  4. Coumadin 2.5 mg on Monday, Wednesday, Friday; 5 mg on Tuesday,      Thursday, Saturday, Sunday.  5. Flonase 2 sprays in each nostril daily.  6. Folic acid 2 mg p.o. daily  7. Lasix 20 mg p.o. daily.  8. Combivent 2 puffs three times daily.  9.  Synthroid 75 mcg p.o. daily.  10.Nexium 40 mg p.o. b.i.d.  11.Nu-Iron 50 mg p.o. b.i.d.  12.KCl 10 mEq p.o. b.i.d.   SOCIAL HISTORY:  The patient lives alone, has a daughter, Morgana, who is  very involved.  Denita is her health care power-of-attorney.  Occupation:  The patient is a retired Engineer, civil (consulting).  She volunteers at [employer].  Tobacco:  Smoked 60 years ago.  No alcohol, no drugs.   FAMILY HISTORY:  Mother is deceased with some questionable CVA.  Father  deceased from questionable poisoning.   REVIEW OF SYSTEMS:  Denies fevers, chills, sweats, fatigue, weight  change.  Denies chest pain.  Denies cough, wheezing.  Denies nausea,  vomiting, abdominal pain.  Denies dysuria.   PHYSICAL EXAMINATION:  VITAL SIGNS:  Temperature 98.1, pulse 70,  respiratory 18, blood pressure 155/90, oxygen saturation 99% on room  air.  GENERAL:  No apparent distress, alert, oriented x3.  HEENT:  Moist mucous membranes.  Pupils are equal, round and reactive to  light.  NECK:  No palpable cervical nodes.  No JVD.  CARDIOVASCULAR:  Irregularly irregular rhythm, 4 beats, skip a beat, 4  beats, skip a beat, 4 beats, skip a beat and repeat.  Dual-chamber pacer  is present on the right upper chest.  LUNGS:   Bilateral crackles especially in the lower lobes.  ABDOMEN:  Positive bowel sounds, nontender, nondistended throughout.  BACK:  No pain.  Severe kyphosis.  EXTREMITIES:  2+ pedal pulses bilaterally.  No edema.  NEUROLOGIC:  Nonfocal.  MUSCULOSKELETAL:  Range of motion fully intact.   LABORATORY DATA:  Lipase 23.  Sodium 137, potassium 4.6, chloride 105,  CO2 26, BUN 24, creatinine 1.35, glucose 89, GFR of 45, total bili 111,  alkaline phosphatase 73, AST 42, ALT 17, albumin 3.8, calcium 9.5, total  protein 0.8.  Point-of-care cardiac enzymes with CK MB less than 1,  troponin less than 0.05, myoglobin 88.6.  CBC with white blood cells  3.4, hemoglobin 10.3, hematocrit 30, platelets 159,000 with normal  differential.  Chest x-ray:  1)  stable COPD changes.  2)  Irregular increased density  in the right mid to upper lung zone.  Pneumonia versus mass.  3)  Stable  cardiomegaly.  EKG with ventricular pacing.   ASSESSMENT AND PLAN:  This is an 77 year old female with:  1. Chest pain, right-sided chest pain most likely noncardiac in      etiology at it has been present for 4 days.  Point-of-care cardiac      enzymes been negative, and there are no new changes on EKG.  We      will cycle cardiac enzymes and get EKG in the morning to rule out      myocardial infarction.  The patient's current echo shows ejection      fraction of 50-55% and this was not presented like congestive heart      failure exacerbation as the patient is not dyspneic though there      are crackles heard throughout exam.  Will continue home medications      for congestive heart failure.  More concerning is a finding on      chest x-ray of the irregular density.  This is concerning for mass      and or pneumonia.  This is most likely pneumonia.  The patient is      afebrile and has no cough with a leukocytopenia on  CBC.  The  patient is also not tachypneic.  The patient received one dose of      Avelox in the  emergency department.  We will not continue Avelox.      Will get chest CT to better identify density on chest x-ray.      Differential of a mass versus hematoma versus pneumonia.  The      patient's creatinine is 1.35.  On discharge in February 2010 her      creatinine at that time was 1.6.  Though this creatinine is lower      than on discharge in February, we are still concerned about      preserving her kidney function.  We discussed with the radiologist      and he has  recommended that we will try to rule out mass.  We can      still be able to do this with a CT of the chest without contrast.  2. Congestive heart failure.  Continue on Lasix and potassium      chloride.  Continue Cozaar.  The patient does not present to be      fluid overloaded.  But we will be gentle with hydration if we need      to.  3. Recurrent gastrointestinal bleed.  Hemoglobin is stable at this      time.  Hemoglobin is higher than on discharge in February 2010.  4. Cecal arteriovenous malformation.  This is stable and followed by      GI.  5. Hypothyroidism.  Will continue her home dose of Synthroid.  6. Chronic obstructive pulmonary disease.  Will continue Combivent and      Flonase.  7. Anemia.  Will continue Nu-Iron, folic acid.  8. Atrial fibrillation.  We will discontinued Coumadin and start      heparin and further workup/slash procedure as needed.  Will      continue on exam.  9. Gastroesophageal reflux disease.  Protonix.  10.Chronic renal insufficiency.  Creatinine of 1.35 is much improved      from her discharge creatinine in February 2010 of 1.6.  We will      continue to try to preserve her kidney function as we evaluate the      cause of her chest pain and the findings on chest x-ray.  11.Gout.  Home dose colchicine.  12.Fluids, electrolytes, and nutrition.  Hep-Lock IV, heart healthy      diet.  13.Prophylaxis.  Coumadin, Protonix.  14.Code.  The patient is full code.  15.Disposition.   Pending negative work up, possible discharge      tomorrow.      Angeline Slim, MD  Electronically Signed      Paula Compton, MD  Electronically Signed    CT/MEDQ  D:  02/18/2009  T:  02/19/2009  Job:  [id number]

## 2011-02-26 NOTE — Discharge Summary (Signed)
NAME:  [name], [name]                  ACCOUNT NO.:  [id number]   MEDICAL RECORD NO.:  [id number]          PATIENT TYPE:  INP   LOCATION:  3704                         FACILITY:  [facility]   PHYSICIAN:  Nestor Ramp, MD        DATE OF BIRTH:  07/21/1923   DATE OF ADMISSION:  11/06/2008  DATE OF DISCHARGE:  11/08/2008                               DISCHARGE SUMMARY   PRIMARY CARE Osie Merkin:  Brett Canales A. Cleta Alberts, MD   DISCHARGE DIAGNOSES:  1. Congestive heart failure exacerbation.  2. Hospital-acquired pneumonia.  3. Coronary artery disease.  4. Chronic atrial fibrillation.  5. Left atrial enlargement.  6. Hiatal hernia.  7. Osteoarthritis.  8. Congestive heart failure.  9. Pulmonary hypertension.  10.Anemia.  11.Autoimmune hepatitis.  12.History of left cerebrovascular accident in 2002, no residual      deficits.  13.Transient ischemic attack x3 in 2003, 2006 and 2007.  14.Gout.  15.Hypothyroidism.  16.Hyperlipidemia.  17.Chronic obstructive pulmonary disease.  18.Bilateral renal cyst.  19.Gastroesophageal reflux disease  20.Gallstones.  21.Pleural effusions.  22.History of St. Jude mitral valve.  23.Pacemaker.   DISCHARGE MEDICATIONS:  1. Avelox 400 mg 1 tablet by mouth daily x12 days.  2. Cardizem 240 mg 1 tablet by mouth daily.  3. Colchicine 0.6 mg 1 tablet by mouth daily.  4. Folic acid 2 mg 1 tablet by mouth daily.  5. Lasix 20 mg 1 tablet by mouth daily.  6. Albuterol inhaler 2 puffs every 4 hours as needed for shortness of      breath.  7. Combivent 2 puffs inhaled three times a day.  8. Synthroid 75 mcg 1 tablet by mouth daily.  9. Cozaar 100 mg 1 tablet by mouth daily.  10.Nexium 40 mg 1 tablet by mouth twice a day.  11.Potassium chloride 10 mEq 1 tablet by mouth twice a day.  12.Coumadin 5 mg by mouth every Sunday, Monday, Wednesday, Friday and      Saturday and 2.5 mg on Tuesday and Thursday.   CONSULTS:  None.   PROCEDURES:  1. Chest x-ray on November 06, 2008 at  0800:  New pulmonary vascular      congestion and mild interstitial edema or infiltrates with      cardiomegaly.  New right pleural effusion with right lower lung      atelectasis/consolidation.  2. Two view chest x-ray on November 06, 2008 at 2100:  Persistent but      improved right effusion and basilar airspace disease.  Tiny left      pleural effusion.  Cardiomegaly.  3. Right lateral decubitus x-ray on November 07, 2008:  Small layer in      pleural effusion on the right.  Superimposed unchanging right      lateral lung base opacity nonspecific that may reflect atelectasis      or consolidation.   LABORATORY DATA:  1. CBC on November 06, 2008 WBC 5.9, hemoglobin 9.0, hematocrit 28.4,      platelets 187.  2. BMET on November 06, 2008 sodium 140, potassium 4.6, chloride 106,  CO2 27, glucose 112, BUN 18, creatinine 1.39.  3. BNP on November 06, 2008, 303.  4. BNP on November 07, 2008, 418.  5. INR on November 08, 2008, 3.0.  6. CBC on November 08, 2008, hemoglobin 9.4.  7. BMET on November 08, 2008, creatinine 1.73.   BRIEF HOSPITAL COURSE:  The patient is a 77 year old female with past  medical history significant for coronary artery disease, CHF, pulmonary  hypertension, COPD, chronic AFib who was recently admitted with a GI  bleed who was admitted on November 06, 2008 with shortness of breath.  1. Dyspnea:  This was most likely CHF exacerbation and hospital-      acquired pneumonia.  The patient had sudden onset shortness of      breath and increased work of breathing, which did not improve with      administration of the patient's home O2.  The patient was diuresed      with IV Lasix and put out an adequate amount of urine.  On      admission chest x-ray did show pulmonary edema and a new right      lower lobe infiltrate.  After the patient was diuresed with Lasix,      the chest x-ray was improved and showed less pulmonary edema.  The      patient was also started on  broad-spectrum antibiotics to cover      hospital-acquired pneumonia.  The patient was started on vancomycin      and Avelox.  Throughout the patient's hospital stay, the patient      remained afebrile and continued to clinically improve.  On hospital      day #2, the patient's vancomycin was discontinued and the patient      was continued on a 14-day course of Avelox.  We did obtain a right      lateral decubitus film of the patient's effusion to make sure this      not a complicated parapneumonic effusion.  Right lateral decubitus      showed a 6.4-mm free-flowing and layering effusion which is      consistent with a simple parapneumonic effusion and which should      improve with antibiotics.  2. COPD.  The patient was still complaining of productive cough.  We      continued the patient's Combivent and albuterol per home dose.      Hopefully, antibiotics have improved the patient's productive      cough.  3. Recent GI bleed.  The patient's hemoglobin remained stable      throughout her hospital stay with most recent hemoglobin of 9.4 on      the time of discharge.  4. Congestive heart failure:  There is no echo in the system and      unsure if this is diastolic or systolic heart failure.  We will      touch base with the patient's cardiologist and try identify what      the patient's last ejection fraction was.  5. Chronic AFib.  The patient did have an EKG on admission which      showed an EKG more consistent with atrial flutter with 4:1 block.      The patient remained rate controlled with heart rate between 60s to      80s with her home dose of Cardizem.  We also continued the      patient's Coumadin.  The patient did have a supratherapeutic  Coumadin 3.6 on November 07, 2008, but on the day of discharge, the      patient's INR was 3.0.  6. Hypothyroidism continued the patient's Synthroid.   DISCHARGE INSTRUCTIONS:  The patient is to seek medical care or return  to the  hospital for any worsening shortness of breath or difficulty  breathing.  The patient is to return to the [facility] on Wednesday  or Thursday to check an INR.  The patient is to increase her activity  slowly.  The patient is to follow a low-sodium heart-healthy diet.   FOLLOW UP:  The patient is return to [facility] on Wednesday or  Thursday to check INR.  The patient is also to return to Dr. Cleta Alberts at  [facility], phone number [phone] on November 15, 2008 at 11:30  a.m.   DISCHARGE CONDITION:  Stable and improved.      Angelena Sole, MD  Electronically Signed      Nestor Ramp, MD  Electronically Signed    WS/MEDQ  D:  11/08/2008  T:  11/09/2008  Job:  [id number]

## 2011-02-26 NOTE — Op Note (Signed)
NAME:  [name], [name]                  ACCOUNT NO.:  [id number]   MEDICAL RECORD NO.:  [id number]          PATIENT TYPE:  INP   LOCATION:  4733                         FACILITY:  [facility]   PHYSICIAN:  Graylin Shiver, M.D.   DATE OF BIRTH:  1923/09/19   DATE OF PROCEDURE:  01/05/2008  DATE OF DISCHARGE:                               OPERATIVE REPORT   PROCEDURE:  Esophagogastroduodenoscopy.   INDICATIONS FOR PROCEDURE:  Obscure gastrointestinal bleeding, etiology  unclear.   Informed consent was obtained after explanation of the risks of  bleeding, infection and perforation.   PREMEDICATION:  Fentanyl 40 mcg IV, Versed 4 mg IV.  The patient was  also given premedication with ampicillin 2 grams IV due to a prosthetic  heart valve.   DESCRIPTION OF PROCEDURE:  With the patient in the left lateral  decubitus position, the Pentax gastroscope was inserted into the  oropharynx and passed into the esophagus.  It was advanced down the  esophagus then into the stomach and into the duodenum.  The second and  third portions of the duodenum looked normal.  The bulb of the duodenum  looked normal.  The stomach looked normal in its entirety.  The  esophagus looked normal in its entirety.  There were no signs of any  active GI bleeding.  No old blood was seen.  No stigmata of bleeding was  seen.  The exam was essentially normal.  She tolerated the procedure  well without complications.   IMPRESSION:  Normal esophagogastroduodenoscopy.   PLAN:  I will proceed with a capsule endoscopy of the small bowel.           ______________________________  Graylin Shiver, M.D.     SFG/MEDQ  D:  01/05/2008  T:  01/05/2008  Job:  [id number]   cc:   Nicki Guadalajara, M.D.

## 2011-02-26 NOTE — Op Note (Signed)
NAME:  [name], [name]                  ACCOUNT NO.:  [id number]   MEDICAL RECORD NO.:  [id number]          PATIENT TYPE:  INP   LOCATION:  3739                         FACILITY:  [facility]   PHYSICIAN:  Graylin Shiver, M.D.   DATE OF BIRTH:  1923-06-04   DATE OF PROCEDURE:  12/05/2008  DATE OF DISCHARGE:                               OPERATIVE REPORT   PROCEDURE:  Colonoscopy with argon plasma coagulation of a cecal  arteriovenous malformation.   INDICATIONS FOR PROCEDURE:  Recurring gastrointestinal bleeding causing  anemia, which has been chronic and recurring.   Informed consent was obtained after explanation of the risks of  bleeding, infection, and perforation.   PREMEDICATION:  The patient was given an additional fentanyl dose of 20  mg IV and Versed 2 mg IV for the colonoscopy portion of this procedure.  It  was done immediately after an EGD.   PROCEDURE IN DETAIL:  With the patient in the left lateral decubitus  position, a rectal exam was performed and no masses were felt.  The  Pentax colonoscope was inserted into the rectum and advanced around the  colon to the cecum.  Cecal landmarks were identified.  In the cecum  behind the ileocecal valve, there was a 7-8 mm AVM which when the scope  brushed across it would ooze a slight amount.  I felt that this may be a  probable source of her recurring melena and anemia and therefore I  decided to go ahead and proceed with argon plasma coagulation of this  lesion.  The argon plasma coagulator was used to cauterize this AVM with  success.  The scope was brought out visualizing the rest of the colon  which looked normal.  She tolerated the procedure well without  complications.   IMPRESSION:  Cecal arteriovenous malformation which was cauterized.   Hopefully, this is the site of her recurring bleeding and hopefully this  will stop her from having recurring bleeding and anemia.   The patient was ordered ampicillin 2 grams IV and  gentamicin 60 mg IV to  be given immediately postprocedure given the therapeutic intervention  that was done at the colonoscopy.  This will again be repeated 8 hours  later.           ______________________________  Graylin Shiver, M.D.     SFG/MEDQ  D:  12/05/2008  T:  12/06/2008  Job:  [id number]

## 2011-02-26 NOTE — Consult Note (Signed)
NAMESHAWNICE, TILMON                  ACCOUNT NO.:  [id number]   MEDICAL RECORD NO.:  [id number]          PATIENT TYPE:  INP   LOCATION:  3709                         FACILITY:  [facility]   PHYSICIAN:  Graylin Shiver, M.D.   DATE OF BIRTH:  June 11, 1923   DATE OF CONSULTATION:  DATE OF DISCHARGE:  10/31/2008                                 CONSULTATION   We were asked to see Ms. Radle today in consultation for GI bleeding by  Dr. Jamie Brookes of medical teaching service.   HISTORY OF PRESENT ILLNESS:  This is an 77 year old female on Coumadin  with recurrent GI bleeding.  She describes 3 red bloody stools last  Wednesday and none since.  Her stools since that time had been greenish  and dark brown.  She says they are jelly like, but she has seen no  further red blood.  She did not go to the doctor on Wednesday thinking  that the bleeding would stop on its own.  However, she had a severe  onset of profound weakness and fatigue during the weekend, and she went  to see Dr. Cleta Alberts who recommended that she come to [facility].  She was found to have a hemoglobin of 6.4 with an MCV value of 78.5.   PAST MEDICAL HISTORY:  1. Chronic atrial fibrillation.  2. Congestive heart failure.  3. Gout.  4. Hyperlipidemia.  5. Hyperthyroidism.  6. Pulmonary hypertension.  7. Autoimmune hepatitis.  8. Cerebrovascular accident with multiple TIAs.  9. Gallstones.  10.Chronic renal insufficiency.  11.She has a St. Jude mitral valve and she has dual-chamber pacemaker.   Most recent colonoscopy on May 29, 2006, and was found to be normal  to the terminal ileum.  Most recent upper endoscopy was in October 2009  and was found to be normal as well.  She had a capsule endoscopy in  March 2009 that failed unfortunately because the battery died before it  was out of esophagus.   She has allergies to ADVIL, NOVOCAIN, SULFONAMIDES, TOPROL, RELAFEN,  XANAX, and CRESTOR.   CURRENT MEDICATIONS:   Coumadin, Nexium, Synthroid, folic acid, Cardizem,  Nu-Iron, Lasix, potassium, Tylenol, colchicine, ProAir, albuterol,  Combivent, oxygen, Nasonex, and Moisturel.   REVIEW OF SYSTEMS:  Significant for a thick sputum, cough, phlegm, and  congestion, as well as some back pain.   SOCIAL HISTORY:  Negative for alcohol, drugs, and tobacco.   FAMILY HISTORY:  Negative for colon cancer and ulcer disease.   PHYSICAL EXAMINATION:  GENERAL:  She is alert and oriented, in no  apparent distress.  She is coughing while I speak to her.  VITAL SIGNS:  Temperature is 97.4, pulse 70, respirations 18, blood  pressure is 119/65.  HEART:  Irregular rhythm and a strong murmur but a regular rate.  ABDOMEN:  Thin, soft, nontender, nondistended with good bowel sounds.   Labs show an INR that is currently 3.6.  This is down from 4.1  yesterday.  Her PT is 38.5.  Hemoglobin is 9.1.  She is status post  transfusion of 2 units  of packed red blood cells.  I would expect her  hemoglobin to drop down to approximately 8.4 as the transfusion settle  in.  White count 8.0, platelets 208,000, BUN is 23, creatinine is 1.59.  LFTs are within normal limits.  In March 2009, she had an upper GI  series that was normal.   ASSESSMENT:  Dr. Herbert Moors has seen and examined the patient,  collected the history, and reviewed her chart.  His impression is this  is an 77 year old female with multiple medical problems including  chronic obstructive pulmonary disease with recurrent gastrointestinal  bleeding.  She is on Coumadin for her atrial fibrillation.  Most likely  scenario for the etiology of her bleeding is angiodysplasia in the small  bowel that oozes when her INR is above 3-3.5.  There are limited  therapeutic options for this.  She is not currently bleeding.  No  endoscopic procedures are recommended at this time.  We will follow with  you during her hospitalization and check clinical symptoms as well as  H&H.  Thanks  very much for this consultation.      Stephani Police, PA    ______________________________  Graylin Shiver, M.D.    MLY/MEDQ  D:  10/31/2008  T:  10/31/2008  Job:  [id number]   cc:   Shirley Friar, MD  [place] Cleta Alberts, M.D.

## 2011-02-26 NOTE — Discharge Summary (Signed)
[name], [name]                  ACCOUNT NO.:  [id number]   MEDICAL RECORD NO.:  [id number]          PATIENT TYPE:  INP   LOCATION:  3739                         FACILITY:  [facility]   PHYSICIAN:  Leighton Roach McDiarmid, M.D.DATE OF BIRTH:  1923/01/12   DATE OF ADMISSION:  12/01/2008  DATE OF DISCHARGE:  12/07/2008                               DISCHARGE SUMMARY   PRIMARY CARE PHYSICIAN:  Dr. Cleta Alberts at [facility].   DISCHARGE DIAGNOSES:  1. Recurrent gastrointestinal bleed, presumed secondary to cecal AV      malformation.  2. Cecal arteriovenous malformation.  3. Atrial fibrillation with St. Jude mitral valve, pacemaker.  4. Questionable multiple myeloma.  5. Hypothyroidism.  6. Pleural effusion.  7. Chronic obstructive pulmonary disease.  8. Hypertension.  9. Gastroesophageal reflux disease.   DISCHARGE MEDICATIONS:  1. Colchicine 0.6 mg p.o. daily.  2. Lovenox 55 mg subcutaneous injection each day for 5 days.  This      prescription will be given to the patient's daughter.  The      patient's daughter is to pick up Lovenox.  Home health will      administer Lovenox on a daily basis for 5 days.  3. Coumadin 2.5 mg daily on Monday, Wednesday, Friday; then 5 mg on      Tuesday, Thursday, Saturday, Sunday.  INR goal is between 2.5-3.5.  4. Flonase 2 sprays daily.  5. Folic acid 2 mg daily.  6. Lasix 20 mg p.o. daily.  7. Combivent 2 puffs 3 times daily.  8. Synthroid 75 mcg p.o. daily.  9. Cozaar 100 mg p.o. daily.  10.Nexium 40 mg twice daily.  11.Cardizem 240 mg daily.  12.Cozaar 100 mg p.o. daily.  Please do not take this medication until      you see Dr. Cleta Alberts  13.Nu-Iron 150 mg p.o. twice daily.  14.Potassium chloride 10 mg p.o. twice daily.   CONSULTATION:  GI was consulted for recurrent gastrointestinal bleeding  and [facility] saw the patient as they have seen the patient in the past.  On December 01, 2008 Dr. Ewing Schlein saw the patient for consultation,  followed by Dr. Evette Cristal.   PROCEDURES:  1,  On December 05, 2008, Dr. Evette Cristal performed an EGD with  placement of a capsule in the small intestine.  1. On December 05, 2008, Dr. Evette Cristal also performed a colonoscopy which      showed a cecal arteriovenous malformation.  Dr. Evette Cristal then      cauterized the AVM.  2. On December 06, 2008 capsule endoscopy showed normal distal      visualization of the small bowel but the but the study was      incomplete and the cecum not reached.  No evidence of active      bleeding.   LABS:  On admission the patient was Hemoccult positive and had a  hemoglobin of 8.0.  On the day of discharge her hemoglobin was stable at  10.2.   On the day of discharge her BMET was found to be within normal limits  with  the exception of a creatinine of 1.99.   BRIEF HOSPITAL COURSE:  This is an 77 year old female with recurrent GI  bleed.  1. Recurrent GI bleed.  The patient was admitted with a hemoglobin of      8.0.  She was given 2 units of packed red blood cells and her      hemoglobin was monitored every 4 hours subsequent to the blood      transfusion.  Her hemoglobin has remained stable during this      hospitalization with a discharge hemoglobin of 10.2.  GI was      consulted to see the patient and [facility] saw the      patient as they have seen her in the past.  They determined that a      colonoscopy would be necessary to determine the cause of her      disease.  They also determined that an EGD may also be necessary.      Dr. Ewing Schlein suspected that her recurrent GI bleed is secondary to an      AVM.  On December 05, 2008, Dr. Evette Cristal performed a colonoscopy.  The      colonoscopy showed that the patient had a cecal AVM, which Dr.      Evette Cristal cauterized.  The patient also had an EGD which was determined      to be normal.  A capsule was placed into the small intestine at      that time.  On December 06, 2008 the capsule was investigated,      which  showed normal vascularized small bowel, but the study was      incomplete and the cecum was not reached.  There was no evidence of      active bleeding.  The plan is that the patient may need a follow-up      small bowel or possibly small bowel CT enterography to visualize      the distal ileum.  This will be forwarded to her PCP, Dr. Cleta Alberts, for      further scheduling of outpatient care.  The patient does have an      appointment to follow up with [facility] on December 20, 2008.  The patient will be seeing Dr. Bosie Clos as she has seen Dr.      Bosie Clos in the past.  2. A fib, St. Jude mitral valve, pacemaker.  The patient required      anticoagulation with Coumadin prior to admission.  The patient was      admitted with a supratherapeutic INR level of 4.4.  Coumadin was      held for 1 day on the day of admission.  Coumadin was not resumed      the following day due to a plan for colonoscopy and possibly EGD.      The patient was placed on Lovenox treatment dosing per pharmacy, 55      mg SQ daily.  The patient was continued on Lovenox and followed      with PT/INR until her colonoscopy and EGD on December 05, 2008.      After the procedure, the patient was resumed on Lovenox with      bridging of Coumadin, with the INR goal of 2.5-3.5 since she does      have a prosthetic valve.  The patient is getting a schedule for her      Coumadin dosage to  be followed on an outpatient basis.  The patient      will be discharged home with Lovenox to be administered daily for 5      days.  The care coordinator has assisted [place] in obtaining Lovenox      for daily use for 5 days.  The patient will get her INR checked at      the Cou[facility]in Clinic at [facility].  Her appointment is      made for December 09, 2008 at 11:40.  The patient will have a home      health nurse arranged to come to her home daily to administer the      Lovenox.  The patient also may remain on her home dose of  diltiazem      240 mg for rate control.  3. Possible multiple myeloma.  On December 02, 2008 the patient had an      abdominal CT and a pelvic CT that showed a small lucency throughout      the visualized bone, which is most likely due to osteopenia but      multiple myeloma was not ruled out.  A UPEP and SPEP were ordered      to rule out multiple myeloma and at this time one pending lab is      the IFE.  Dr. Cleta Alberts, her primary care physician, can follow this up      in E chart.  4. Hypothyroidism.  This is stable.  The patient is to continue on her      home dose of Synthroid 75 mcg p.o. daily.  5. Pleural effusion on chest x-ray.  The patient had a chest x-ray on      December 01, 2008 that showed a loculated right pleural effusion.      This has been found to be a chronic condition as previous chest x-      ray also showed this loculated pleural effusion.  At this time we      will not work this up any further and her PCP, Dr. Cleta Alberts, can work      this up on an outpatient basis.  6. COPD.  This is stable.  The patient did not require any O2      supplementation during this hospitalization.  She has not had any      fever or cough or wheezing on exam.  The patient is to continue on      Combivent, Flonase, albuterol.  7. Hypertension.  Her blood pressure during this hospitalization has      been stable.  She will continue on losartan, diltiazem, Lasix.  8. Increased creatinine.  On the day of discharge her creatinine was      found to be 1.99, with a previous creatinine of 1.64.  The patient      does have a diagnosis of chronic renal insufficiency but this      elevation in creatinine is most likely secondary to decrease in      fluid consumption secondary to n.p.o. status for the procedure.      The patient most likely has not caught up with her fluid status.      This can also be monitored on an outpatient basis by her PCP.  I've      asked patient not to take Cozaar until she sees  Dr Cleta Alberts at follow-      up exam.   FOLLOW-UP APPOINTMENTS:  1. Dr. Cleta Alberts on December 13, 2008 at 11:30, phone number to [phone].  2. [facility] on December 09, 2008 at 11:40.  3. [facility] with Dr. Bosie Clos on December 20, 2008 at 2      o'clock, phone number [phone].  4. [facility] will come to the patient's home on a daily basis      for 5 days to administer Lovenox.   DISCHARGE CONDITION:  Stable.   DISCHARGE ACTIVITY:  Increase activity slowly.   DIET:  A low-sodium, heart-healthy diet.   WOUND CARE:  Not applicable.      Angeline Slim, MD  Electronically Signed      Leighton Roach McDiarmid, M.D.  Electronically Signed    CT/MEDQ  D:  12/07/2008  T:  12/07/2008  Job:  [id number]   cc:   Brett Canales A. Cleta Alberts, M.D.

## 2011-02-26 NOTE — Consult Note (Signed)
NAME:  [name], [name]                  ACCOUNT NO.:  [id number]   MEDICAL RECORD NO.:  [id number]          PATIENT TYPE:  EMS   LOCATION:  MAJO                         FACILITY:  [facility]   PHYSICIAN:  [name], M.D.   DATE OF BIRTH:  1923/02/06   DATE OF CONSULTATION:  12/11/2007  DATE OF DISCHARGE:                                 CONSULTATION   NOTE:  We were asked to see [name] today in consultation for GI  bleeding by internal medicine family practice service.   HISTORY OF PRESENT ILLNESS:  This is an 77 year old female who is a  regular patient of [name] at [facility] who is  experiencing her third episode of rectal bleeding in the past 3 months.  She states that her stools are normally firm and brown but now they are  loose and yellow and more frequent than usual.  She has noticed some red  blood in them yesterday.  She has also noticed increasing dyspnea on  exertion as well as left flank pain that is intermittent and not  relieved by bowel movements and not changed with food.  She describes  occasional reflux as well as mild dysphagia and requires sips of water  to be able to swallow meat and bread.  She states her appetite is good.  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  She volunteers there on a regular basis.  She is a  retired Psychologist, sport and exercise from [employer]  Her last endoscopy was June 25, 2007, by Dr. Charlott Rakes.  He found nonbleeding erosive gastropathy  and a single gastric polyp.  Her last colonoscopy was May 29, 2006,  by Dr. Carman Ching and was found to be normal with the exception of  some small internal hemorrhoids.   CARE PHYSICIANS:  Her primary care physician is Dr. Earl [name].  Her  cardiologist is Dr. Nicki [name] and her gastroenterologist is Dr.  Charlott Rakes.   PAST MEDICAL HISTORY:  1. Autoimmune hepatitis.  2. Atrial fibrillation on chronic Coumadin.  3.  Congestive heart failure.  4. Anemia.  5. Mild chronic renal insufficiency.  6. Gout.  7. Hyperlipidemia.  8. Pulmonary hypertension.  9. Hypertension.  10.Hypothyroidism.  11.Cholelithiasis.  12.Stroke with residual of mild aphasia.  13.Sick sinus syndrome status post pacemaker placement in November of      2001.  14.Osteoarthritis status post bilateral knee arthroscopy.  15.Valvular heart disease.  She is status post St. Jude mitral valve      replacement in November of 2001.  16.Reflux.  17.Hypothyroidism.   CURRENT MEDICATIONS:  Include Coumadin, Synthroid, folic acid, Cardizem,  Cozaar, Lasix, Imuran, Nexium, K-Dur, colchicine, chronic oxygen,  Phenergan, albuterol inhaler, azathioprine, Darvocet, Nasonex, iron,  atenolol and Combivent.   ALLERGIES:  She has allergies to ADVIL, NOVOCAIN, SULFA and TOPROL.  She  does not use NSAIDs.   REVIEW OF SYSTEMS:  Is positive for pain across her shoulders  bilaterally.   SOCIAL HISTORY:  Is negative for tobacco and alcohol,  although she tells  me she used to smoke cigarettes many years ago.  She lives alone but is  cared for by her daughter and her granddaughter who live nearby.   FAMILY HISTORY:  Is negative for colon cancer.  Negative for ulcer  disease.   CURRENT LABS:  Show a hemoglobin of 8.4 with an MCV value of 86.3.  Her  hemoglobin yesterday in the [facility] GI office was 8.3.  Her hemoglobin in  December of 2008 was 10.  Current white blood cell count 4.0.  Hematocrit 25.3.  Platelets 198,000.  BMET is significant for BUN of 31,  creatinine 2.01, glucose is 90, INR is currently 4.5.  Yesterday in the  [facility] office it was between 5.5 and 6.  PTT is 44.3.   PHYSICAL EXAM:  GENERAL:  She is alert and oriented, in no apparent  distress.  She is on nasal cannula oxygen.  VITAL SIGNS:  Temperature is 98.0, respirations 16, pulse 73, blood  pressure is 115/47.  CARDIOVASCULAR:  System has a regular rhythm with a 3/6  systolic murmur.  LUNGS:  Lungs are without wheezes or crackles, but she has decreased  breath sounds on the left.  ABDOMEN:  Soft, nontender, nondistended with active bowel sounds.  EXTREMITIES:  Her lower extremities show no edema.   ASSESSMENT:  Dr. Molly Maduro Buccini has seen and examined the patient.  He  states this is a very pleasant 77 year old female with an extensive  medical history who is currently experiencing left abdominal pain and  increasing dyspnea on exertion as well as some bright red blood per  rectum.  She has a supratherapeutic INR and appears to have dehydration  versus an exacerbation of her mild chronic renal failure.   PLAN:  The patient will be admitted by the [facility] family practice  service.  We will monitor her closely, gently rehydrate her, transfuse 1  unit of packed red blood cells as well as 1 unit of FFP to start to  normalize her INR.  Will check an acute abdominal series given her left  abdominal pain and her shortness of breath.  Will also start her on IV  Protonix.  Dr. Charlott Rakes will see her over the weekend.   Thanks very much for this consultation.      Stephani Police, PA    ______________________________  [name], M.D.    MLY/MEDQ  D:  12/11/2007  T:  12/12/2007  Job:  [id number]   cc:   [name], M.D.  Shirley Friar, MD  [place] Cleta Alberts, M.D.  [name], M.D.

## 2011-02-26 NOTE — H&P (Signed)
NAMENITZA, [name]                  ACCOUNT NO.:  [id number]   MEDICAL RECORD NO.:  [id number]          PATIENT TYPE:  INP   LOCATION:  2308                         FACILITY:  [facility]   PHYSICIAN:  Zenon Mayo, MDDATE OF BIRTH:  12-15-1922   DATE OF ADMISSION:  05/30/2009  DATE OF DISCHARGE:                              HISTORY & PHYSICAL   PROCEDURE:  Intraoperative transesophageal echocardiogram.   DESCRIPTION:  [name] is an 77 year old woman with a history of mitral  valve repair who now has significant aortic stenosis and is being  brought to the operating room by Dr. Laneta Simmers for aortic valve  replacement.  Intraoperative echocardiogram was requested to evaluate  the valve both prebypass as well as the repair.  The patient was brought  to the operating room and put under general anesthesia.  After  confirmation of endotracheal tube placement and orogastric suctioning, a  transesophageal echo probe was placed without evidence of trauma.  The  four-chamber view of the heart was initially viewed, which revealed no  pericardial effusions and all areas of the heart appeared to move well.  The left ventricle was assessed.  The ventricle was moderately thickened  with wall thickness of 15 mm.  There did not appear to be any wall  motion abnormalities and the ejection fraction was estimated to be 55%.  The mitral valve was assessed next and revealed a St. Jude prosthetic  valve in the mitral position.  The valve leaflets appeared to move  appropriately and there did not appeared to be any significant mitral  regurgitation seen.  The aortic valve was imaged next and revealed a  heavily calcified tricuspid aortic valve.  The valve leaflets had very  limited movement and turbulent flow across the valve.  Due to the  shadowing of the mitral valve, it was difficult to measure an aortic  valve area by either planimetry with a deep transgastric view.  The  pulmonic valve was then  assessed and appeared to be normal in structure  and function.  The tricuspid valve which appeared to be normal revealed  a mild amount of tricuspid regurgitation when color Doppler was placed  across the valve.  The interatrial septum did not appear to have any of  atrioseptal defects noted.  The thoracic aorta had a mild amount of  atherosclerotic disease.   At the conclusion of cardiopulmonary bypass, the heart was again  evaluated.  A tissue valve had been placed in the aortic position and  structurally and functionally appeared to be working well.  The tissue  valve was trileaflet and had normal leaflet excursion.  There was no  aortic insufficiency noted across the valve.  The left ventricle  remained unchanged from prebypass views and all other structures and  functions of the heart were unchanged from prebypass assessment.  At the  conclusion of the procedure, the echo probe was removed from the  patient's esophagus without evidence of trauma.  The patient remained in  stable condition during her postbypass period and was taken directly  from the operating room to  the intensive care unit in stable fashion.           ______________________________  Zenon Mayo, MD     WEF/MEDQ  D:  06/05/2009  T:  06/06/2009  Job:  [id number]

## 2011-02-26 NOTE — Op Note (Signed)
NAMESHAKYLA, [name]                  ACCOUNT NO.:  [id number]   MEDICAL RECORD NO.:  [id number]          PATIENT TYPE:  INP   LOCATION:  6714                         FACILITY:  [facility]   PHYSICIAN:  Bernette Redbird, M.D.   DATE OF BIRTH:  22-Feb-1923   DATE OF PROCEDURE:  07/28/2008  DATE OF DISCHARGE:                               OPERATIVE REPORT   PROCEDURE:  Upper endoscopy.   INDICATIONS:  An 77 year old with recent history of dark stools (on  iron), dyspnea, drop in hemoglobin, heme-positive stool without frank  melena on digital exam, and requirement for transfusion of about 4 units  of blood over the past year, previous endoscopy and colonoscopy by Dr.  Bosie Clos unrevealing for source of anemia, an attempted capsule  endoscopy unsuccessful due to impaction of the capsule in the esophagus,  presents now as stated with heme-positive stool and anemia (hemoglobin  6.8) while on Coumadin for prosthetic heart valve replacement and with  supratherapeutic INR of approximately 4.5.   FINDINGS:  Normal exam except small hiatal hernia.  No source of anemia  evident.   PROCEDURE:  The procedure was familiar to the patient from prior  examination and she provided written consent.  She was brought in a  fasted state from her hospital room to the endoscopy unit and sedated  with fentanyl 30 mcg and Versed 3 mg IV without clinical instability.  The Pentax adult video endoscope was passed gently and without  significant difficulty under direct vision into the esophagus.   The esophagus was endoscopically normal.  No reflux esophagitis, free  reflux, Barrett's esophagus, varices infection, neoplasia, ring or  stricture were identified.  In specifically, although the patient has a  history of rare episodes of dysphagia, I could not identify any obvious  lesion or abnormality in the esophagus, such as a ring or web, that  would account for that.  A 1-2 cm hiatal hernia was present.   The stomach  was entered and contained a minimal bilious residual with no  blood or coffee-ground material.  There was a little bit of faint  erythema in the antrum of the stomach but nothing look pathologic, and  no erosions, ulcers, polyps, or masses were observed anywhere in the  stomach including a retroflexed view of the cardia, which showed the  hiatal hernia from its inferior perspective.   The pylorus, duodenal bulb, and second duodenum also looked normal,  without vascular ectasia or other sources of anemia or blood loss.   The scope was then removed from the patient who tolerated the procedure  well without apparent complication.   IMPRESSION:  1. Heme-positive stool and anemia while on Coumadin, without evident      source on current examination.  2. Small hiatal hernia, not felt to be clinically significant.   Plan on clinical followup.  In view of the fact, the patient does not  have a severe transfusion requirement and in view of the fact that she  is not a frequent returned patient because of anemia, and in view of the  fact that to  do a capsule  endoscopy, she would probably have an esophageal dilatation first, I  would be inclined not to pursue further workup at this time, but we  certainly could consider doing so in the event of increasingly frequent,  repeated hospitalizations for the same problem of occult blood loss,  obscure GI bleeding, and/or anemia.           ______________________________  Bernette Redbird, M.D.     RB/MEDQ  D:  07/28/2008  T:  07/28/2008  Job:  [id number]   cc:   Nicki Guadalajara, M.D.  [place] Cleta Alberts, M.D.

## 2011-02-26 NOTE — Consult Note (Signed)
NAMEJAZIA, FARACI                  ACCOUNT NO.:  [id number]   MEDICAL RECORD NO.:  [id number]          PATIENT TYPE:  INP   LOCATION:  4733                         FACILITY:  [facility]   PHYSICIAN:  Stephani Police, PA    DATE OF BIRTH:  1923-04-04   DATE OF CONSULTATION:  DATE OF DISCHARGE:                                 CONSULTATION   We were asked to see Ms. Stipe today in consultation for a drop in  hemoglobin by Dr. Deirdre Priest.  Today's date is January 04, 2008.   HISTORY OF PRESENT ILLNESS:  This is an 77 year old female who came to  the ER early this morning with shortness of breath and shoulder pain  (both of which are better now).  She reports one black tarry stool on  March 22, but no bright red blood per rectum recently.  She was in the  hospital with very similar symptoms approximately 3 weeks ago.  Last  time, the drop in hemoglobin was attributed to a supra-therapeutic INR.  The patient had a colonoscopy 18 months ago that was normal and an upper  endoscopy 6 months ago that showed erosive gastropathy.  She has been on  Nexium since that time.  The patient is also on an 81-mg aspirin.  She  denies abdominal pain, endorses left flank pain.  She also reports  dyspnea on exertion but she feels much better now that she has had 2  units of packed red blood cells transfused.   PAST MEDICAL HISTORY:  Is extensive and significant for:  1. Mitral valve replacement in 2001, secondary to childhood rheumatic      fever.  2. Pacemaker placement in 2001 secondary to sick sinus syndrome.  3. Congestive heart failure.  4. Coronary artery disease.  5. Atrial fibrillation.  6. She is on chronic Coumadin.  7. Anemia.  8. Autoimmune hepatitis.  9. CVA with residual mild aphasia.  10.Hypothyroidism.  11.Hyperlipidemia.  12.COPD.  13.Osteoarthritis.  14.Pulmonary hypertension.  15.Gout.  16.Reflux.  17.GERD.  18.Chronic renal insufficiency with a baseline creatinine of 1.6.   Her GI  history is significant for a GI bleed in August 2007, at which  time she had a colonoscopy done that showed a normal colon with small  internal hemorrhoids.  She had an endoscopy done in September 2008,  which showed erosive gastropathy and one gastric polyp.   PRIMARY CARE PHYSICIAN:  Dr. Earl Lites.   CARDIOLOGIST:  Dr. Nicki Guadalajara.   GASTROENTEROLOGY PHYSICIAN:  Dr. Charlott Rakes.   ALLERGIES:  SHE HAS ALLERGIES TO ADVIL, NOVOCAIN, SULFA AND TOPROL.   CURRENT MEDICATIONS:  Include albuterol, an 81-mg aspirin, atenolol,  Cardizem, Claritin, colchicine, Combivent, Coumadin, Imuran, Hyzaar,  iron, MVI, Nasonex, Nexium, Phenergan, Synthroid, potassium chloride,  Lasix and azathioprine.   REVIEW OF SYSTEMS:  Negative for fever.   SOCIAL HISTORY:  She lives alone, denies alcohol and tobacco.   FAMILY HISTORY:  Is negative for colon cancer and ulcer disease.   PHYSICAL EXAM:  GENERAL:  She is alert and oriented, in no apparent  distress.  She is very pleasant to speak with.  CARDIOVASCULAR:  Her cardiovascular system has a regular rhythm now.  LUNGS:  Her lungs are clear to auscultation but have decreased breath  sounds with no wheeze or crackles.  ABDOMEN:  Soft, nontender, nondistended with good bowel sounds.   LABS:  Show a hemoglobin of 10.3, that is after a 2-unit transfusion.  She was admitted with a hemoglobin of 7.8 and an MCV value of 83.8.  Hematocrit now is 30.3, white count 4.4, platelets 207,000.  BMET is  significant for a BUN of 21, creatinine 1.52.  INR is down to 2.9 from  3.9, and PT is currently 31.   ASSESSMENT:  Dr. Wandalee Ferdinand has seen and examined the patient, collected  a history, and reviewed her chart.  His impression is that this is an 23-  year-old female on 81 mg aspirin and Coumadin, with a decrease in  hemoglobin, colonoscopy approximately 18 months ago, makes a repeat  colonoscopy rather low yield.  Will plan to evaluate her small bowel  with a  small bowel enteroscopy, a small bowel CT enteroscopy and pending  on those results, possibly an upper endoscopy on Wednesday.  Thanks very  much for this consultation.      Stephani Police, [place]     MLY/MEDQ  D:  01/04/2008  T:  01/04/2008  Job:  [id number]   cc:   Stan Head. Cleta Alberts, M.D.  Shirley Friar, MD  Nicki Guadalajara, M.D.  Graylin Shiver, M.D.

## 2011-02-26 NOTE — Consult Note (Signed)
[name], GUINTHER                  ACCOUNT NO.:  [id number]   MEDICAL RECORD NO.:  [id number]          PATIENT TYPE:  INP   LOCATION:  6714                         FACILITY:  [facility]   PHYSICIAN:  Bernette Redbird, M.D.   DATE OF BIRTH:  August 06, 1923   DATE OF CONSULTATION:  07/27/2008  DATE OF DISCHARGE:                                 CONSULTATION   We are asked to see Ms. Favata today by Dr. Paula Compton of the [facility] today for an acute drop in hemoglobin.   HISTORY OF PRESENT ILLNESS:  This is an 76 year old female with a  mechanical valve and on Coumadin for atrial fibrillation.  She has had  recurrent GI blood loss anemia, suspected to be due to small bowel AVMs.  Unfortunately, her small bowel capsule endoscopy failed in Dec 27, 2007.   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  Her hemoglobin  was found to be 6.8 and she was sent to [facility] as a direct  admission.  She reports black stools for about a week.  She denies other  illness.  Specifically, she denies abdominal and epigastric pain.  No  diarrhea.  No vomiting.  No signs of bleeding other than her dark  stools.  The patient has a good appetite.  She describes minimal  dysphagia, but otherwise feels good.  She is not taking any NSAIDs  medications.  Her primary care physician is Dr. Earl Lites.  Her  gastroenterologist is Dr. Charlott Rakes and her cardiologist is Dr.  Nicki Guadalajara.  Her last upper endoscopy was in March 15, 2009it was found  to be normal.  Her last colonoscopy was in August 2007, it was found to  be normal to the GI and she had a capsule endoscopy in 2007/12/27.  The  battery died before it was out of her esophagus, so we were unable to  complete that study.  She has a history of atrial fibrillation and is on  chronic Coumadin.  She has a history of rheumatic fever as a child and  has a St. Jude mechanical  mitral valve.  She has a dual-chamber  pacemaker that was placed in 2001 secondary to sick sinus syndrome.   She has pulmonary hypertension, autoimmune hepatitis, and  cerebrovascular accident with multiple TIAs and no apparent residual.  She has a history of hypothyroidism, hyperlipidemia.  GERD, COPD, gout,  osteoarthritis, and renal insufficiency as well as cholelithiasis.   She has allergies to ADVIL, NOVOCAIN, SULFONAMIDE, TOPROL, RELAFEN, and  XANAX.   CURRENT MEDICATIONS:  Per chart but include Coumadin and do not include  any NSAIDs.   SOCIAL HISTORY:  Negative for alcohol, tobacco, and drugs.   FAMILY HISTORY:  Negative for colon cancer.  Negative for ulcer disease.   REVIEW OF SYSTEMS:  Per HPI.   PHYSICAL EXAMINATION:  GENERAL:  She is alert and oriented in no  apparent distress.  VITAL SIGNS:  Her temperature is 97.0, pulse 84, respirations  18, and  blood pressure is 99/47.  HEART:  Regular rate and rhythm.  LUNGS:  Clear to auscultation.  ABDOMEN:  Soft, nontender, and nondistended with good bowel sounds.  RECTAL:  She had black soft stool.  She has no masses.  The exam is  nontender and guaiac positive.   Her labs are pending, but she was reported to have a hemoglobin of 6.8  at and Dr. Ellis Parents office this morning.   ASSESSMENT:  Dr. Molly Maduro Buccini has seen and examined the patient,  collected a history, and reviewed her chart.  His impression is that  this is an 77 year old female with multiple medical problems including  chronic obstructive pulmonary disease and atrial fibrillation.  She has  guaiac-positive stools and acute blood loss anemia.  We will plan for an  upper endoscopy on July 28, 2008, in the morning after her labs have  been checked and she has been able to receive blood transfusions and  fluids.  She will remain on PPI therapy.      Stephani Police, PA    ______________________________  Bernette Redbird, M.D.    MLY/MEDQ  D:   07/27/2008  T:  07/28/2008  Job:  [id number]   cc:   Shirley Friar, MD  Stan Head Cleta Alberts, M.D.  Nicki Guadalajara, M.D.  Bernette Redbird, M.D.

## 2011-02-26 NOTE — H&P (Signed)
NAME:  [name], [name]                  ACCOUNT NO.:  [id number]   MEDICAL RECORD NO.:  [id number]          PATIENT TYPE:  INP   LOCATION:  1844                         FACILITY:  [facility]   PHYSICIAN:  Pearlean Brownie, M.D.DATE OF BIRTH:  Jun 24, 1923   DATE OF ADMISSION:  12/11/2007  DATE OF DISCHARGE:                              HISTORY & PHYSICAL   CHIEF COMPLAINT:  Blood in stool.   PRIMARY CARE Raad Clayson:  Dr. Cleta Alberts at [facility].   Patient's GI doctor is Dr.  Shirley Friar, [phone].  Cardiologist  is Dr. Nicki Guadalajara,  [phone].   HISTORY OF PRESENT ILLNESS:  The patient is an 77 year old female with a  complicated past medical history including a mechanical mitral valve who  is on chronic Coumadin and presents with a 3-day history of blood in her  stool.  She first noticed the bleeding about 2 days ago when she had a  bowel movement that was mixed in with her stool and there was both dark  red and bright red blood, and this continued to occur with each bowel  movement until this morning when it ceased.  She was seen by Dr. Tresa Endo 2  days ago when this all started who sent her to Dr. Marge Duncans office.  There, she saw one of Dr. Marge Duncans colleagues and they recommended she  come to the emergency department; however, she did not come to the  emergency department until today.  Of note, she has had 2 similar  episodes in the past 3 months.  Additionally, she was hospitalized in  August of 2007 for similar episode.  When her colonoscopy was done,  there was no active bleeding seen, and the etiology of her GI bleed was  never fully determined.  Of note, on an EGD done in September of 2008,  it showed a normal esophagus, hiatal hernia, nonbleeding erosive  gastropathy, a normal duodenum, and under the impression section, it  also states that there is a suspected small bowel arteriovenous  malformation as a source of her heme-positive stool.  Additionally, a  verbal  report from Dr. Marge Duncans nurse states the patient had a normal  colonoscopy around the same time in 2008.   Review of systems is positive for lightheadedness for the past few days,  increased shortness of breath over the past few days, and an occasional  pain in her left side near her rib cage that is intermittent.  Negative  for dysuria, upper respiratory symptoms, abdominal pain, confusion, or  palpitation.  Additionally, according to [place], her baseline hemoglobin  is around 9 to 10.  The following hemoglobins have been measured  recently.  In August of 2008, it was 9.2; October of 2008, it was 10.8;  January of 2009, it was 9.9; February 10th of this year, it was 9.6;  February 26th of this year, it was 8.3; and today in our ER, it is 8.4.  In the emergency department, the patient received labs, normal saline at  a 100 mL an hour, and she was typed and screened.   PAST MEDICAL  HISTORY:  Includes:  1. Coronary artery disease.  2. Childhood rheumatic fever.  3. Chronic atrial fibrillation.  4. Left atrial enlargement.  5. A mitral valve replacement with a St. Jude mechanical valve in      2001.  6. Pacemaker placement in 2001, dual-chamber pacemaker.  7. CHF.  8. Pulmonary hypertension.  9. Anemia.  10.Autoimmune hepatitis.  11.History of cerebrovascular accident in 2002.  12.History of multiple probable TIAs.  13.Hypothyroidism.  14.Hyperlipidemia.  15.Gallstones.  16.Gastroesophageal reflux disease.  17.Chronic obstructive pulmonary disease.  18.Bilateral renal cyst,  19.Osteoarthritis.  20.Gout.  21.Hiatal hernia.   MEDICINES:  Include:  1. Coumadin 5 mg daily; however, the patient has not taken this for      the past 2 days.  2. Synthroid 0.75 mg daily.  3. Folic acid 1 mg daily.  4. Cardizem 240 mg daily.  5. Cozaar 100 mg daily.  6. Azathioprine 25 mg daily.  7. Nexium 40 mg daily.  8. Lasix 40 mg daily.  9. KCl 10 mEq ER daily.  10.Colchicine 0.6 mg  daily.  11.ProAir HFA 8.5/25, 2 puffs q.4-6 h. p.r.n.  12.Albuterol inhaled q.4 h. p.r.n. for shortness of breath.  13.Combivent inhaler 2 puffs q.6 h. p.r.n.  14.Oxygen 2 liters p.r.n.  15.Nasonex 2 puffs per nostril p.r.n.   MEDICINE ALLERGIES:  Include:  1. ADVIL  2. NOVOCAIN.  3. SULFONAMIDES.  4. TOPROL XL.  5. RELAFEN.  6. Prudy Feeler.   PAST SURGICAL HISTORY:  Includes:  1. Arthroscopy of bilateral knees.  2. Mitral valve replacement 2001.  3. Pacemaker 2001.   FAMILY MEDICAL HISTORY:  Include diabetes, hypertension, and coronary  artery disease.   SOCIAL HISTORY:  The patient lives alone.  Her daughter works at [employer]  [employer] where the patient gets her medical care and lives close by  to family.  The patient has a good support system.  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   PHYSICAL EXAMINATION:  On exam, temperature 97.2 to 98, pulse 70 to 73,  respirations 16, blood pressure 110 to 115/47 to 98, and oxygen  saturation is 99 to 100% on 2 liters.  GENERAL:  The patient was in no acute distress.  She was cooperative.  She was pleasant.  HEENT:  Head was normocephalic and atraumatic.  Moist mucous membranes.  Pupils were equally round and reactive to light.  Oropharynx was clear.  Tympanic membranes were clear.  NECK:  Supple with no lymphadenopathy detected.  CARDIOVASCULAR:  Regular rate and rhythm with a 4/6 systolic murmur  consistent with an artificial heart valve.  PULMONARY:  No increased work of breathing, but a prolonged expiratory  phase with occasional wheezes.  No crackles were detected.  ABDOMEN:  Soft, nontender, and nondistended.  Normoactive bowel sounds.  EXTREMITIES: No clubbing, cyanosis, or edema was noted.  NEUROLOGIC: No focal neurological deficits were detected.   LABS AND STUDIES:  Include a PT elevated at 44.3 and an INR elevated at  4.5.  Stool was heme positive.  CBC showed a white blood cell count  of  4.0, hemoglobin of 8.4, hematocrit of 25.3, platelets 198,000, MCV was  86.3, and RDW was 16.3.  Electrolytes showed sodium of 139, potassium  4.0, chloride 107, bicarb 22, BUN 31, and creatinine 2.01.  Glucose was  90.  Differential for the CBC included 82% neutrophils, 12% lymphocytes,  and ANC of 3.2.  UA showed a specific gravity  of 1.020, small bilirubin,  and small leukocytes.  Micro showed few epithelial cells, 3 to 6 white  blood cells, 0 to 2 red blood cells, and few bacteria.   ASSESSMENT/PLAN:  1. This is an 77 year old female on chronic coagulation therapy with      gastrointestinal bleed.  This has been a chronic problem and from      past records, it seems that the source has never been clearly      identified.  We will consult Gastroenterology.  In the meantime, we      will keep her n.p.o. in case they decide they want to do GI      studies.  We will hold Coumadin for now and give her Protonix      b.i.d.  2. Cardiovascular.  The patient with mechanical heart valve and atrial      fibrillation.  Will need coagulation therapy in the longterm.  For      now, we will hold Coumadin and check daily INRs.  May want to      consult Cardiology for their recommendations as well.  We will      continue Cardizem, Cozaar, Lasix, and potassium.  We will get a      tele bed, get an electrocardiogram on arrival to the floor.  We      will probably also transfuse her 1 to 2 units.  Because of her      cardiac issues, we will probably need to keep her hemoglobin closer      to 10.  3. Chronic obstructive pulmonary disease.  We will continue oxygen      p.r.n., and we will prescribe albuterol metered-dose inhaler p.r.n.      for difficulty breathing.  4. Anemia.  Baseline per [place] is 9 to 10.  Based on her most recent      hemoglobin, source of her anemia is most likely chronic      gastrointestinal bleeds.  We could do an iron panel, but I really      do not think this is  necessary at this time as we have a very      likely source identified.  The patient does get routine iron      transfusions, and we will plan to transfuse the patient.  5. Hypothyroidism.  Continue Synthroid.  6. Arthritis and pain.  We will give Tylenol.  7. Gout.  Continue colchicine.  8. Autoimmune hepatitis.  Continue azathioprine.  9. Renal failure.  The patient's creatinine on her i-STAT was 2.01.      Her last creatinine we have measured here      was 1.4.  I am not sure the reason for this, do not have a report      of decreased p.o. intake, and the patient does not look      particularly dry.  We will recheck creatinine in the morning and      will consider hydrating her to see if that improves her renal      function.  This may need further investigation.      Asher Muir, MD  Electronically Signed      Pearlean Brownie, M.D.  Electronically Signed    SO/MEDQ  D:  12/11/2007  T:  12/12/2007  Job:  [id number]

## 2011-02-26 NOTE — Cardiovascular Report (Signed)
NAMEMICHOLE, LECUYER NO.:  [id number]   MEDICAL RECORD NO.:  [id number]          PATIENT TYPE:  INP   LOCATION:  2037                         FACILITY:  [facility]   PHYSICIAN:  Nicki Guadalajara, M.D.     DATE OF BIRTH:  06/02/1923   DATE OF PROCEDURE:  05/30/2009  DATE OF DISCHARGE:                            CARDIAC CATHETERIZATION   INDICATIONS:  [name] is a very pleasant 77 year old African  American female who underwent St. Jude mitral valve replacement surgery  in 2001 by Dr. Kathlee Nations Trigt.  In November 2001, she underwent  transvenous permanent pacemaker insertion for sick sinus syndrome with  symptomatic bradycardia and PAF.  She is status post remote AV nodal  ablation surgery and has a history of atrial flutter.  Her last  catheterization in March 2005, showed normal LV function with well  seated St. Jude mitral prosthesis and no significant aortic valve  disease.  Recently, subsequent echocardiographic evaluations have  demonstrated progressively worsening aortic valve stenosis.  Most  recently, on echocardiography, she has been found to have a  transvalvular mean gradient of 52 mm with a peak instantaneous gradient  of 86 mm with an estimated RV systolic pressure of 51 mm in early May  2010.  In August 2010, RV systolic pressure now estimated 67 mm.  Her  mean transvalvular aortic gradient was 44, a maximum gradient was 80,  and ejection fraction now 45-50% which was reduced from previously.  She  has developed increasing symptoms of shortness of breath and cough.  She  is now admitted for cardiac catheterization.  The patient's Coumadin was  held for several days and she has had a Lovenox, Coumadin crossover.  She comes today for catheterization procedure.   PROCEDURE:  After premedication with Valium initially with 3 mg, the  patient was prepped and draped in usual fashion.  During the early  procedure, she received an additional 2 mg of Valium  for additional  sedation.  Her right femoral artery and right femoral vein were  punctured anteriorly and a 6 French arterial sheath and 7-French venous  sheath were inserted.  A Swan-Ganz catheter was inserted via the venous  sheath and pressures were obtained in the RA, RV, PA and pulmonary  capillary wedge positions.  Due to her significant pulmonary  hypertension, a 0.025 wire was necessary to position the catheter into  the PA position.  No PA and PC positions.  Thermodilution cardiac output  was obtained.  A 5-French pigtail catheter was then inserted via the 6-  French sheath and advanced to the central aorta.  Central aortic  pressure was recorded.  O2 saturation was also obtained for  thermodilution cardiac output determination.  There was subsequently, a  long arduous attempt was made trying to traverse the very stenotic  aortic valve.  A central aortography was also performed to visualize and  confirm the markedly reduced excursion.  Despite multiple catheters and  multiple wires, the aortic valve was never able to be successfully  crossed.  A right catheter was used for selective  angiography into the  right coronary artery and a 5-French pigtail and another 5-French left  coronary artery catheter was used for selective angiography into the  left coronary artery.   Multiple additional attempts were then also made to cross the valve  again unsuccessful.  A pigtail catheter was then reinserted and distal  aortography was performed.  The patient tolerated the procedure well.  Hemostasis was obtained by direct manual pressure.   HEMODYNAMIC DATA:  Right atrial pressure mean 10.  The right ventricle  pressure is 70/90.  Pulmonary artery pressure is 70/25.  Mean pulmonary  capillary wedge pressure 20, A-wave 19 and V-wave 27.   Central aortic pressure was 133/63.   O2 saturation in the pulmonary artery was 55% and the central aorta was  96%.   Central aortography demonstrated  calcification of the aortic valve.  There was significantly reduced aortic valve excursion corroborating the  patient's severe aortic stenosis documented on echocardiography.   ANGIOGRAPHIC DATA:  Left main coronary artery was angiographically  normal and trifurcated into an LAD, a ramus intermediate vessel and a  dominant circumflex coronary artery.   The left main was angiographically normal.   The LAD was angiographically normal, extended and wrapped around the LV  apex and gave rise to several diagonal vessels and several small septal  perforating arteries.   The ramus intermediate vessel was angiographically normal.   The circumflex vessel was an angiographically normal dominant vessel.   The right coronary was an angiographically normal nondominant vessel.   Distal aortography revealed patent renal arteries.  There was mild  luminal irregularity, but no significant aortoiliac disease.   IMPRESSION:  1. Severe aortic valve stenosis with markedly reduced aortic valve      excursion.  However, the aortic valve was not able to be crossed      due to the markedly reduced excursion and severe IS, but echo      Doppler data suggest a mean gradient of approximately 50 and a peak      instantaneous gradient in the 80s with calculated aortic valve area      of 0.4 cm.  2. Normal coronary arteries.  3. Severe pulmonary hypertension with pulmonary artery systolic      pressure of 70 mm, an echo Doppler data is suggestive of at least      moderate to moderately severe truck tricuspid regurgitation.  4. Status post St. Jude mitral valve replacement surgery with only      trace mitral regurgitation noted on echocardiography.  5. No significant aortoiliac disease.   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  She has noticed a progressive decline in over the last several months  and has  noticed increasing shortness of breath.  A surgical consultation  will be obtained for consideration for aortic valve replacement surgery.           ______________________________  Nicki Guadalajara, M.D.     TK/MEDQ  D:  05/30/2009  T:  05/30/2009  Job:  [id number]   cc:   Nicki Guadalajara, M.D.  Kerin Perna, M.D.  Stan Head Cleta Alberts, M.D.

## 2011-02-26 NOTE — Op Note (Signed)
NAMEMARGEL, [name]                  ACCOUNT NO.:  [id number]   MEDICAL RECORD NO.:  [id number]          PATIENT TYPE:  INP   LOCATION:  2308                         FACILITY:  [facility]   PHYSICIAN:  Evelene Croon, M.D.     DATE OF BIRTH:  1923/03/25   DATE OF PROCEDURE:  06/05/2009  DATE OF DISCHARGE:                               OPERATIVE REPORT   POSTOPERATIVE DIAGNOSIS:  Critical aortic stenosis, status post mitral  valve replacement in 2001.   POSTOPERATIVE DIAGNOSIS:  Critical aortic stenosis, status post mitral  valve replacement in 2001.   PROCEDURE:  Redo median sternotomy, extracorporeal circulation, aortic  valve replacement using a 21-mm Edwards pericardial Magna - Ease  pericardial valve.   SURGEON:  Evelene Croon, MD   FIRST ASSISTANT:  Salvatore Decent. Dorris Fetch, MD   SECOND ASSISTANT:  Coral Ceo, [facility]   ANESTHESIA:  General endotracheal.   CLINICAL HISTORY:  This patient is an 77 year old woman with a history  of St. Jude mitral valve replacement by Dr. Donata Clay in 2001 for  rheumatic mitral valve disease.  She also had a history of sick sinus  syndrome with symptomatic bradycardia and paroxysmal atrial fibrillation  and is status post permanent pacemaker in 2001.  She also a history of  AV node ablation for her left atrial flutter in the past.  She has known  a history of aortic stenosis.  Catheterization was last performed in  2005 showing mild ostial left main tapering.  Successive echo's over the  past couple of years have shown progressive aortic stenosis with the  most recent echo on May 18, 2009, showing critical aortic stenosis  with a valve area of 0.44 sq cm with a mean gradient of 44 and a peak  gradient of 80.  She presents with worsening shortness of breath and  fatigue and underwent cardiac catheterization on May 30, 2009, which  showed no significant coronary artery disease.  PA pressure was 70/25  with an RV pressure of 70/9, right atrial  pressure was 10.  Wedge  pressure was 20.  It was not possible to cross the valve.  Post  catheterization, she developed some left upper extremity weakness as  well as left facial droop and some difficulty swallowing.  She did have  a history of prior stroke and TIAs and had been followed by Neurology.  A CT scan of the brain showed some old inferior infarct, did not show  any acute infarct.  Her symptoms gradually resolved over the next 24-48  hours.  She got back up and started walking and was eating and  clinically stable.  We spent considerable amount time discussing whether  we should offer her aortic valve replacement surgery.  Given her  progressive symptoms of congestive heart failure and critical aortic  stenosis, we felt that if we did not perform surgery she was likely  going to have progressive downhill course and succumb soon.  I discussed  the operative procedure of aortic valve replacement with the patient and  her daughter.  We discussed alternatives, benefits, and the increased  risks including but not limited to bleeding, blood transfusion,  infection, stroke, myocardial infarction, organ failure, heart block,  and death.  Her and her daughter fully understood all these risks and  completely accepted them and said they would rather try to proceed with  surgery and hope for good outcome.  I discussed the use of a tissue  valve to replace her valve given her age.  I felt this would be easier  to insert having a prior St. Jude mechanical mitral valve in place.  She  understood this and agreed.   OPERATIVE PROCEDURE:  The patient was taken to the operating room and  placed on the table in the supine position.  After induction of general  endotracheal anesthesia, a Foley catheter was placed in the bladder  using sterile technique.  Then, the chest, abdomen, and both lower  extremities were prepped and draped in the usual sterile manner.  Preoperative intravenous antibiotics  were given.  Then, the  transesophageal echocardiogram was performed by Anesthesiology.  This  was performed by Dr. Autumn Patty.  This showed a severely  calcified aortic valve.  It was not possible to measure the valve area  or the gradient due to shadowing from the mechanical mitral valve.  Left  ventricular function actually appeared somewhat better than it did from  previous echocardiogram.  She did have significant pulmonary  hypertension with a PA systolic pressure in the 70s prior to being put  to sleep.  This did decrease down into the 50s asleep.  We were able to  see her tricuspid valve and there was mild tricuspid regurgitation.   Then, the chest was reentered through the previous median sternotomy  incision.  Sternal wires were removed.  The sternum was opened using  oscillating saw without difficulty.  The sternal edges were retracted  and the heart was dissected from the back of the sternum.  A chest  retractor was placed.  Dissection was performed to expose the right  atrium and ascending aorta.  The patient was then heparinized when an  adequate activated clotting time was achieved.  The distal ascending  aorta was cannulated using a 20-French aortic cannula for arterial  inflow.  Venous outflow was achieved using a 2-stage venous cannula  through the right atrial appendage.  An antegrade cardioplegia and vent  cannula was inserted in the aortic root.  The patient was placed on  cardiopulmonary bypass.  Then, a left ventricular vent was placed  through the right superior pulmonary vein and a retrograde cardioplegic  cannula was inserted through the right atrium and the coronary sinus.   The aorta was then cross-clamped and 1500 mL of cold blood antegrade  cardioplegia was administered in the aortic root with quick arrest of  the heart.  Systemic hypothermia to 30-degree centigrade and topical  hypothermic iced saline was used.  A temperature probe was placed in  the  septum and insulating pad in the pericardium.  Additional doses of  retrograde cardioplegia were given throughout the procedure at about 20-  minute intervals to maintain myocardial temperature of around 10-degree  centigrade.   Then, the aorta was opened transversely just above the sinotubular  junction.  Visibility of the valve was fairly good.  Examination of the  valve showed there were 3 leaflets that were heavily calcified.  The  noncoronary leaflet was completely frozen and immobile.  There was  minimal movement of the other 2 leaflets.  The native valve was excised.  There was moderate annular calcification and the annulus was decalcified  using rongeurs.  Care was taken to remove all particulate debris.  The  mechanical mitral valve was easily visualized and there was a fair  margin of tissue between the mechanical mitral valve and the aortic  annulus.  Then, the left ventricle and ascending aorta were irrigated  with iced saline solution.  The annulus was sized and a 21-mm Edwards  pericardial Magna - Ease tissue valve was chosen.  This had model number  [id number], serial number [id number].  The left and right coronary ostia were  lying fairly far off the aortic annulus so that a supra-annular valve  could easily be placed.  Then, a series of pledgeted 2-0 Ethibond  horizontal mattress sutures were placed with pledgets in the subannular  position.  The sutures were placed through the sewing ring valve and  valve lowered in place.  The sutures were tied sequentially.  The valve  seated nicely.  The right and left coronary ostia were not obstructed.  Then, the patient was rewarmed to 37 degrees centigrade.  The aortotomy  was closed in 2-layer using continuous 4-0 Prolene suture.  The  aortotomy was lightly covered with CoSeal for hemostasis.  Then, the  patient was rewarmed to 37 degrees centigrade.  The left side of the  heart was de-aired and head placed in the Trendelenburg  position.  The  crossclamp was removed x77 minutes.  There was spontaneous return of  ventricular fibrillation and the patient was defibrillated into a paced  rhythm.  Then, 2 temporary right ventricular and right atrial pacing  wires were placed and brought out through the skin.  When the patient  rewarmed to 37 degrees centigrade, she was weaned from cardiopulmonary  bypass on no inotropic agents.  Total bypass time was 114 minutes.  Cardiac function appeared good with cardiac output of 5 liters per  minute.  Transesophageal echocardiogram showed normal functioning aortic  valve prosthesis.  There was no evidence of perivalvular leak or  regurgitation.  The mitral valve prosthesis was functioning normally.  Pulmonary artery pressure had decreased significantly, so the PA  systolic was in the 30s.  Then, protamine was given and the venous and  aortic cannulae were removed without difficulty.  Hemostasis was  achieved.  The patient was given 2 units of platelets due to platelet  count of 60,000.  Three chest tubes were placed with the tube in the  posterior pericardium, one in the left pleural space, and one in the  anterior mediastinum.  The sternum was then closed with #6 stainless  steel wires.  The fascia was closed with continuous #1 Vicryl suture.  Subcutaneous tissue was closed with continuous 2-0 Vicryl and skin with  a 3-0 Vicryl subcuticular closure.  Sponge, needle, and instrument  counts were correct according to the scrub nurse.  Dry sterile dressing  was applied over the incisions around the chest tubes which were hooked  with Pleur-Evac suction.  The patient remained hemodynamically stable  and transferred to the SICU in guarded, but stable condition.Evelene Croon, M.D.  Electronically Signed     BB/MEDQ  D:  06/05/2009  T:  06/06/2009  Job:  [id number]

## 2011-02-26 NOTE — H&P (Signed)
[name], [name]                  ACCOUNT NO.:  [id number]   MEDICAL RECORD NO.:  [id number]          PATIENT TYPE:  INP   LOCATION:  3739                         FACILITY:  [facility]   PHYSICIAN:  [name], M.D.DATE OF BIRTH:  1923-10-06   DATE OF ADMISSION:  12/01/2008  DATE OF DISCHARGE:                              HISTORY & PHYSICAL   REASON FOR ADMISSION:  GI bleed.   HISTORY OF PRESENT ILLNESS:  This is an 77 year old very pleasant female  with a history of GI bleed on Coumadin therapy for St. Jude valve and  atrial fibrillation presents after waking up this morning with blood on  her clothes.  Subsequently, after getting up she proceeded to have a  large bowel movement of bright red blood.  She since had multiple  episodes of melanotic stools and has become somewhat fatigued.  These  stools have been associated with abdominal pain primarily in the left  side of her abdomen.  Her only other complaint today besides the above  is cough productive of some yellow sputum.  She denies any fevers,  chills, headaches, dizziness, nausea, vomiting, chest pain.  She has  felt slightly short of breath at times with this cough.  This morning  she has not taken her medications.   PRIMARY CARE PHYSICIAN:  Savannah Canales A. Cleta Alberts, MD, [facility]   PAST MEDICAL HISTORY:  1. Coronary artery disease.  2. Atrial fibrillation.  3. Left atrial enlargement.  4. Hiatal hernia.  5. Osteoarthritis.  6. Congestive heart failure.  7. Pulmonary hypertension.  8. Anemia.  9. History of autoimmune hepatitis.  10.History of left CVA in 2002 with residual deficit.  11.History of multiple TIAs.  12.Gout.  13.Hypothyroidism.  14.Hyperlipidemia.  15.COPD.  16.Bilateral renal cyst.  17.GERD.  18.Gallstones.  19.History of GI bleed with normal scopes including a colonoscopy in      August 2007 and EGD October 2009.  20.Chronic renal insufficiency with unknown baseline.   PAST SURGICAL HISTORY:  1. St. Jude mitral valve replacement.  2. Dual-chamber pacemaker.  3. Bilateral knee arthroscopy.   FAMILY HISTORY:  Mother is deceased from questionable CVA and father is  deceased from questionable poisoning.   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  She is currently single and her  daughter [name] is her health care power of attorney.  She has remote  tobacco use and denies any alcohol or drug use.   REVIEW OF SYSTEMS:  The patient did point out a node on her left neck.  This is of unknown duration.  However, review of systems is otherwise  negative per HPI.   PHYSICAL EXAMINATION:  VITAL SIGNS:  Temperature is 97.6, respiratory  rate of 16, pulse 77, blood pressure 146/69, O2 sat 96% on 2 L by nasal  cannula.  GENERAL:  This is a pleasant elderly African American female in no  apparent distress.  HEENT:  Normocephalic,  atraumatic with pupils equal, round, and reactive  to light.  Extraocular muscles intact.  Oropharynx pink and moist.  Dentures are currently in place.  CARDIOVASCULAR:  There is a paced rhythm with prominent 3/6 harsh  systolic murmur, a mechanical valve was put.  There is no JVD present  today.  NECK:  Less than 1-cm node in the anterior inferior left chain.  It is  nontender to palpation.  PULMONARY:  Clear to auscultation bilaterally.  Breath sounds are  distant.  There is normal work of breathing.  BACK:  Severe kyphosis.  ABDOMEN:  Mildly tender diffusely without rebound or guarding.  There  are hyperactive bowel sounds.  Abdomen is soft.  There is no  hepatosplenomegaly appreciated.  EXTREMITIES:  No edema and 1+ peripheral pulses.  NEURO:  The patient is alert and oriented x3 with cranial nerves II  through XII grossly intact and the patient is moving all extremities.   HOME MEDICATIONS:  1.  Coumadin.  2. Synthroid 0.75 mg p.o. daily.  3. Folic acid 2 mg p.o. daily.  4. Cardizem 240 mg p.o. daily.  5. Cozaar 100 mg p.o. daily.  6. Nu-Iron 150 mg p.o. b.i.d.  7. Nexium 40 mg p.o. b.i.d.  8. Lasix 20 mg p.o. daily.  9. KCl 10 mEq p.o. b.i.d.  10.Colchicine 0.6 mg p.o. daily.  11.Combivent 2 puffs inhaled t.i.d.  12.Flonase 2 puffs per nares daily.   ALLERGIES:  TOPROL, RELAFEN, SULFA, NOVOCAIN, and XANAX.   LABORATORY DATA:  White blood cell count 2.7, hemoglobin 8.0, hematocrit  24.3, platelet count 109 with an MCV of 79, RDW 18.8, and 82%  neutrophils.  Sodium 139, potassium 3.7, chloride 107, bicarb 26, BUN  15, creatinine 1.46, CBG 79, calcium 9.5.  Hemoccult is positive and INR  was 4.4.   STUDIES:  None.   ASSESSMENT:  This is an 77 year old female with multiple medical  problems with recurrence of her GI bleed.   PLAN:  1. GI bleed.  We suspect this is a chronic source that is      unfortunately unknown at this time given her history of previously      normal scopes.  2. Large peripheral IVs and type and cross 2 units of packed red blood      cells and transfuse.  I spoke with Algis Downs, the physician's      assistant for [facility] and they do want a tagged red blood cell      scan stat unless we will order this and see if our source can be      found and subsequently treated.  Her INR is slightly therapeutic at      this time.  Thus, we will hold her Coumadin at least for now and      monitor her INR closely to get it back into normal range.  For now,      we will Hemoccult all her stools and monitor her CBCs q.4 using      pediatric tubes to avoid excessive bleeding.  We will monitor her      vitals closely and we will watch her closely with telemetry.  We      will continue PPI b.i.d. for now for stomach precaution and I have      asked the [facility] for further inpatient recommendation.  3. Recent pneumonia.  We will check a chest x-ray given her  slight OT      requirement to  make sure she has resolved her pneumonia, but now we      will just monitor her vital signs.  4. CHF.  We will continue her home dose of Lasix.  Give her some Lasix      20 mg p.o. with each unit of packed red blood cells.  We will      monitor her closely.  5. Atrial fibrillation.  We will continue diltiazem and hold her      Coumadin right now while she is supratherapeutic with plans to      restart once she gets back towards therapeutic range.  6. Mechanical valve.  Again, we will hold her Coumadin until her INR      is within the 2.5-3.5 range before restarting.  7. Anemia.  We will transfuse as noted above.  We will continue her      folic acid and iron.  8. Hypothyroidism.  We will continue her home Synthroid.  Her TSH was      recently checked in January 2010.  9. COPD.  We will continue her home Combivent and add p.r.n.      albuterol.  10.Chronic renal insufficiency is stable since her previous discharge.  11.Prophylaxis on SCDs and we will avoid any other medication,      antithrombotic given her current bleeding.  12.Code status.  The patient is full code.  13.Disposition.  Pending workup and clinical improvement.      Ancil Boozer, MD  Electronically Signed      [name], M.D.  Electronically Signed    SA/MEDQ  D:  12/01/2008  T:  12/02/2008  Job:  [id number]

## 2011-02-26 NOTE — H&P (Signed)
NAME:  [name], [name]                  ACCOUNT NO.:  [id number]   MEDICAL RECORD NO.:  [id number]          PATIENT TYPE:  INP   LOCATION:  3704                         FACILITY:  [facility]   PHYSICIAN:  Nestor Ramp, MD        DATE OF BIRTH:  05-02-1923   DATE OF ADMISSION:  11/06/2008  DATE OF DISCHARGE:                              HISTORY & PHYSICAL   PRIMARY CARE Solaris Kram:  Stan Head. Cleta Alberts, MD, [facility].   CHIEF COMPLAINT:  Shortness of breath.   HISTORY OF PRESENT ILLNESS:  The patient is an 77 year old female with  past medical history significant for coronary artery disease, CHF,  pulmonary hypertension, COPD, chronic AFib, St. Jude mitral valve, who  has had a recent hospitalization for GI bleed requiring 2 units of  packed red blood cells.  The patient presents this morning with acute  onset of worsening shortness of breath in the context of 2 months of  cough and bronchitis with several antibiotic regimens.  Last night, the  patient felt short of breath, went to bed, awoke this morning feeling  much worse and feeling like she cannot catch her breath.  The patient  tried use of home oxygen, which provided no relief.  She occasionally  uses home O2 at 2 liters nasal cannula as needed, but generally does not  use it all the time.  The patient was also complaining of coughing up  foamy sputum, but no increased sputum production over baseline.  The  patient denies any nausea, vomiting, diarrhea, or chest pain.   PAST MEDICAL HISTORY:  1. Coronary artery disease.  2. Chronic AFib.  3. Left atrial enlargement.  4. Hiatal hernia.  5. Osteoarthritis.  6. CHF.  7. Pulmonary hypertension.  8. Anemia.  9. Autoimmune hepatitis.  10.Left CVA in 2002 with no residual deficits.  11.TIAs x3 in 2003, 2006, and 2007.  12.Gout.  13.Hypothyroidism.  14.Hyperlipidemia.  15.COPD.  16.Bilateral renal cyst.  17.GERD.  18.Gallstones.  19.Pleural effusions.   PAST SURGICAL HISTORY:  1. St. Jude mitral valve.  2. Pacemaker placement.  3. Bilateral knee arthroscopy.   SOCIAL HISTORY:  Lives alone and works as a Agricultural consultant at [employer].  Retired Public house manager.  Single, daughter involved and  healthcare power of attorney.  Brief tobacco use 40 years ago, none  current.  No alcohol or illicit drug use.   FAMILY HISTORY:  Mother deceased from appearance of CVA.  Father  deceased from poisoning.   REVIEW OF SYSTEMS:  Positive for melena.  Negative for bright red blood  per rectum, fevers, nausea, vomiting, diarrhea, chest pain.  All other  systems were reviewed and negative.   ALLERGIES:  TOPROL, RELAFEN, SULFA DRUGS, NOVOCAIN, XANAX.   MEDICATIONS:  1. Coumadin 5 mg p.o. Sunday, Monday, Wednesday, Friday, and Saturday.      2.5 mg p.o. Tuesday and Thursday.  2. Synthroid 0.75 mg p.o. daily.  3. Folic acid 2 mg p.o. daily.  4. Cardizem 240 mg p.o. daily.  5. Cozaar 100 mg p.o. daily.  6. Nu-Iron 150 mg p.o. b.i.d.  7. Nexium 40 mg p.o. b.i.d.  8. Lasix 20 mg p.o. daily.  9. Potassium chloride 10 mEq p.o. b.i.d.  10.Colchicine 0.6 mg p.o. daily.  11.Combivent 2 puffs inhaled t.i.d.  12.Albuterol 2 puffs q.4 h. p.r.n. shortness of breath.  13.Nasonex 50 mcg 2 sprays each naris daily as needed for allergies.   PHYSICAL EXAMINATION:  VITAL SIGNS:  Temperature 98.3, pulse 70,  respiratory rate 24, blood pressure 148/68, pulse ox 100% on 2 liters.  GENERAL:  Alert and oriented x3.  No acute distress.  CARDIAC:  Irregular rhythm with a regular rate, multiple PVCs.  A 3/6  harsh systolic murmur.  Bilateral JVD.  PULMONARY:  Normal work of breathing on O2, decreased breath sounds  primarily in the right base, bilateral basilar crackles, right greater  than left.  ABDOMEN:  Normoactive bowel sounds, nontender, no masses or  hepatosplenomegaly.  EXTREMITIES:  Nontender without edema.   LABS AND RADIOLOGY:  1. Portable chest x-ray:  New pulmonary vascular  congestion and mild      interstitial edema or infiltrates with cardiomegaly.  New right      pleural effusion and right lower lobe atelectasis/consolidation.  2. BMET; sodium 148, potassium 4.6, chloride 106, CO2 27, BUN 18,      creatinine 1.39, glucose 112.  3. CBC:  WBC 5.9, hemoglobin 9.0, hematocrit 28.4, platelets 197.  4. Point-of-care cardiac enzymes negative.  5. INR 2.9.  6. BNP 303.  7. EKG rate 70, atrial flutter with 4:1, left bundle branch block, no      ST or T-wave changes.   ASSESSMENT AND PLAN:  The patient is an 77 year old female with  shortness of breath.  1. Shortness of breath history, on BMP, and chest x-ray consistent      with congestive heart failure exacerbation, but right pleural      effusion makes hospital-acquired pneumonia consideration as well.      There are no fever or elevated white count.  We will admit to      telemetry bed and diurese with furosemide 40 mg IV now and repeat      in 6 hours.  We will recheck a chest x-ray after diuresis.  Recheck      BMP in a.m.  We will cover empirically with moxifloxacin and      vancomycin for hospital-acquired pneumonia.  2. Recent gastrointestinal bleed.  Admitted 1 week ago for      gastrointestinal bleed requiring 2 units packed red blood cells.      Denies bright red blood per rectum, but does endorse melena.      Hemoglobin is consistent with discharge.  Hemoglobin will monitor      daily.  3. Chronic atrial fibrillation:  Continue anticoagulation, goal 2.5-      3.5 for mechanical valve.  Continue Cardizem.  4. Gout:  Continue home colchicine.  5. Hyperlipidemia:  No current medications.  We will follow up with      primary care physician.  6. Hypothyroidism:  We will continue on home Synthroid.  7. Chronic obstructive pulmonary disease:  Continue home Combivent      t.i.d. scheduled and albuterol p.r.n.  8. Prophylaxis:  Pantoprazole 40 mg p.o. b.i.d. given recent      gastrointestinal bleed.   Anticoagulated from mechanical heart      valve.  9. Fluids, electrolytes, nutrition, gastrointestinal:  Heart healthy      low-sodium diet.  Electrolytes  within normal limits.  We will      recheck in the a.m. after diuresis.  We will Hep-Lock IV fluid.  10.Advanced planning:  The patient is full code.  Daughter has      healthcare power of attorney.  11.Disposition:  Pending resolution of respiratory distress.      Angelena Sole, MD  Electronically Signed      Nestor Ramp, MD  Electronically Signed    WS/MEDQ  D:  11/06/2008  T:  11/06/2008  Job:  [id number]

## 2011-02-26 NOTE — Discharge Summary (Signed)
[name], [name]                  ACCOUNT NO.:  [id number]   MEDICAL RECORD NO.:  [id number]          PATIENT TYPE:  INP   LOCATION:  3733                         FACILITY:  [facility]   PHYSICIAN:  Dani Gobble, MD       DATE OF BIRTH:  07-16-1923   DATE OF ADMISSION:  02/02/2008  DATE OF DISCHARGE:  02/03/2008                               DISCHARGE SUMMARY   HISTORY OF PRESENT ILLNESS:  Ms. [name] is an 77 year old African-  American female of patient of  Dr. Tresa Endo with a history of chronic  atrial fib saying she had NBR, AV node ablation, pace maker placement,  and CVA.  She recently had a hospitalization for GI bleed.  She came to  the emergency room with little chest pain.  On the day of admission, she  had had her first run of IV of irons, since 2008, she stated that she  received different iron infusion than usual.  Apparently,during the  infusion, she started having chest pain, left shoulder pain, trapezius  pain bilaterally, and pain over her right pacer site.  She had also had  some nausea and slight emesis.  She insisted on going home after the  infusion was completed, and she was able to drive herself home.  When  she was at home though, the pain became severe and she felt like she  could not breath, and she was exacerbated.  In the emergency room, she  was feeling better.  We decided to admit her for observation, she was  fearful of having more pain than she experienced.  Her point-of-care  markers were negative x2, her CK-MBs were negative x2, and her troponins  were negative x1.  Her hemoglobin was stable.  BNP was 284, her INR was  3.5.  I  was decided on February 03, 2008, that she was stable.  She had  some bone pain, was decided to do a x-ray.  There has been some reaction  towards the iron.  She was seen by Dr. Tresa Endo, her primary cardiologist  and discharged home.   LABORATORY DATA:  Sodium 135, potassium 3.5, glucose 63, BUN 18,  creatinine 1.46, hemoglobin 12.3,  hematocrit 37.5, WBCs 4.6, and  platelets were 205.   I did not see chest x-ray in the chart.   DISCHARGE MEDICATIONS:  1. Coumadin 5 mg all days except Monday, Wednesday, and Saturday.  She      takes half a pill.  2. Synthroid 75 mcg per day.  3. Folic acid 2 mg a day.  4. Cardizem CD 240 mg a day.  5. Cozaar 100 mg a day.  6. Nexium 40 mg twice a day.  7. Lasix 20 mg a day.  8. K-Dur 10 mEq a day.  9. Colchicine 0.6 mg a day.  10.Nasonex 2 sprays daily.  11.Compazine.  12.MDI Albuterol.  13.ProAir HFA.  14.Azathioprine 25 mg a day.   She will follow up Dr. Tresa Endo in approximately 3 weeks.   DISCHARGE DIAGNOSES:  1. Chest pain, nausea, shortness of breath, noncardiac related,  probably related to iron infusion reaction.  2. History of normal coronary artery disease 2000 followed by cath.  3. She ensured NBR.  4. Anticoagulation, on Coumadin for the same due to [facility].  5. Chronic atrial fibrillation.  6. Parents' Postoperative Pain Measure September 11, 2000.  7. History of cerebrovascular accident, transient ischemic attack      January 06, 2002.  8. Chronic obstructive pulmonary disease.  9. Hypothyroidism.  10.Hypertension.  11.History of anemia, iron deficiency and blood loss.  12.History of atrioventricular node ablation in June 07, 2003.  13.Autoimmune hepatitis.   Note to Dr. Mervin Hack:  He was notified with the patient's admission and  her reaction to the iron.      [name], N.P.    ______________________________  Dani Gobble, MD    BB/MEDQ  D:  03/06/2008  T:  03/07/2008  Job:  [id number]   cc:   Valentino Hue. Magrinat, M.D.  Rubye Oaks, MD

## 2011-02-26 NOTE — Consult Note (Signed)
[name], [name]                  ACCOUNT NO.:  [id number]   MEDICAL RECORD NO.:  [id number]          PATIENT TYPE:  INP   LOCATION:  2037                         FACILITY:  [facility]   PHYSICIAN:  Melvyn Novas, M.D.  DATE OF BIRTH:  1923-03-01   DATE OF CONSULTATION:  05/31/2009  DATE OF DISCHARGE:                                 CONSULTATION   REASON FOR CONSULTATION:  Left-sided weakness and left facial droop.   HISTORY OF PRESENT ILLNESS:  This is a pleasant 77 year old African-  American female who has been seen previously in the office on January 16, 2006 by Dr. Thad Ranger.  At that time, the patient was seen for transient  spells of aphasia and confusion.  The patient had three of these first  occurring in 2002, again in 2003, and at that time in January of 2007.  Dr. Thad Ranger had seen her for each of these episodes.  During each of  the episodes, the patient had an alteration of consciousness with the  last occurring on the phone with her daughter in which her speech was  not making sense.  The patient had undergone  a CT of her head which  was unremarkable, and also negative EEGs.  She had also had a CT angiogram of her head, neck, carotid and  transcranial Dopplers, all which were unremarkable since that date of  January 16, 2006.  At that time, Dr. Thad Ranger believed it was certainly  possible that she has had a cardioembolic TIA, and seizures were a more  remote possibility.  He had also done a mini-mental status exam in which she scored a 23.30;  however, one must take into account that the patient has a fourth grade  level of education.   In May of 2010, the patient was readmitted to the hospital secondary to  right-sided chest pain x4 days that radiated to her back.  the pain is  stated to be reproduced by ultrasound probe placed over the sternal  region.  patient re4sponded with decrease of pain to a dose of po  1/2 tablet of  50 mg Ultram during the hospitalization.   On this hospitalization, May 31, 2009, the patient came in for a  cardiac catheterization which noted that the patient had severe aortic  valve stenosis and markedly reduced aortic valve excursion.  Doppler dated did suggest a mean gradient of approximately 50 and a peak  of instantaneous gradient in the 80s with calculated aortic valve area  of 0.4 cm.  The patient had normal coronary arteries.  The patient had severe pulmonary hypertension with pulmonary artery  systolic pressure of 70 mm, and echo Doppler data that is suggestive of  at least moderate to moderately severe trunk tricuspid regurgitation.  Post catheterization, it was noted that the patient was having left-  sided weakness along with left facial droop and left facial drooling.  The patient states this was transient and it has now resolved.  The patient also reported left visual field blurring ,while she had her  left arm weakness.  During consultation, it was  noted that the patient does still have a  left nasolabial fold decrease; however, she does not have any weakness  on her left upper extremity.  She has no dysarthria, dysphasia, no  blurred vision.  No paresthesias.  The patient did obtain a CT scan  which noted old stable left cerebellar infarct, but no new acute  infarct.   Unfortunately, no MRI is able to be obtained.   PAST MEDICAL HISTORY:  1. Congestive heart failure with ejection fraction of 50-55% on echo      done on Feb 15, 2009 which also showed severely dilated left atrium.  2. 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  3. Atrial fibrillation with a St. Jude's mitral valve pacer.  4. Hypothyroidism.  5. COPD.  6. Hypertension.  7. GERD.  8. Pulmonary hypertension.  9. Osteoarthritis.  10.Gout.   PAST SURGICAL HISTORY:  1. St. Jude mitral valve.  2. Dual-chamber pacemaker.  3. Bilateral knee arthroscopy.   ALLERGIES:  1. ADVIL WHICH CAUSED A RASH.  2.  TOPROL-XL.  3. RELAFEN.  4. SULFA.  5. Prudy Feeler.  6. NOVOCAIN.   MEDICATIONS:  1. Diltiazem 120 mg p.o. daily.  2. Nexium 40 mg p.o. b.i.d.  3. Folic acid 2 mg p.o. daily.  4. Lasix 20 mg daily.  5. Heparin 25,000 units IV q.38h.  6. Levothyroxine 75 mcg p.o. daily.  7. Cozaar 50 mg p.o. b.i.d.  8. MiraLax 17 grams p.o. Monday, Wednesday and Friday.  9. Potassium chloride 10 mEq p.o. daily.  10.Vitamin B12 one tablet p.o. daily.  11.Tramadol 50 mg p.o. daily p.r.n.   FAMILY HISTORY:  The patient was alone.  She has a daughter, Tesia, who  is very involved in her health care.  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  She does volunteer at [employer].   CARDIAC ASSESSMENT:  The patient smoked 60 years ago.  She does not  drink or do illicit drugs.   REVIEW OF SYSTEMS:  Negative with the exception of above.   PHYSICAL EXAMINATION:  VITAL SIGNS:  Temperature is 96.4, blood pressure  is 148/76, heart rate 69, respiratory rate 18, O2 saturation 100% on 2  liters of oxygen nasal cannula.  HEENT:  Trachea is midline.  Head is normocephalic, atraumatic.  Positive left nasolabial folds.  NECK:  Supple with negative bruits bilaterally.  LUNGS:  Clear to auscultation bilaterally.  CARDIOVASCULAR:  S1, S2 is audible.  ABDOMEN:  Soft, nontender, nondistended.  NEUROLOGIC:  The patient's facial sensation V1-V3 is equal bilaterally.  Extraocular muscles are intact.  Pupils are equal, round and reactive,  accommodating to light.  Smile symmetrical.  Positive left nasolabial  fold decrease.  Tongue is midline.  Uvula is midline.  Tongue has full  range of motion.  The patient has no dysarthria or dysphasia at this  time.  Shoulder shrug and head turn are all within normal limits.  Strength - the patient has decreased strength on her right arm secondary  to old left CVA.  She has preserved strength in her left arm,  left and bilateral legs.  The patient has good grip strength with her left being greater than her  right.  She shows no ataxia with finger-to-nose, heel-to-shin.  She is able to move all extremities within normal limits.  Sensation to  pinprick,  light touch and vibration throughout are grossly intact.  Gait  was not tested.    Deep tendon reflexes are depressed throughout with bilateral upgoing  toes.   TEST RESULTS:  Sodium is 139, potassium 3.8, chloride 104, CO2 of 31,  BUN is 11, creatinine is 1.23, glucose 91.  White blood cell count is  3.2, hemoglobin and hematocrit are 9.0 and 26.4, platelets 131.  PT is 15.9.  INR is 1.3.  Chest x-ray shows no abnormalities seen in the  right mid upper lung field which was questioned on the x-ray.  She does  have mild bibasilar linear atelectasis and moderate cardiomegaly.  CT of the head shows stable left cerebellar atrophy plus lacunar infarct  in the left cerebellum.  MRI unfortunately not available secondary to pacer.   ASSESSMENT:  This is a pleasant 77 year old African-American female with  what most likely is a cardioembolic source of right CVA that is not  visible on CT scan.  At this time, there is no reason why the patient cannot undergo aortic  valve replacement but she may have unrealistic expectations as to the  outcome and quality of life . Annye Asa been discussed with her and her  daughter, as her surgeons feel she likely has a poor outcome, may remain  vent dependent . He is concerned about the degree of ventricular failure  now and the dilation not being addressed by the surgical valve  replacement. or repair. Neurologically , there is a high risk of  recurrent TIAs and strokes with the condition left as is.  Surgeon consultant recommended that if the patient does not go under  surgery, to continue with the Coumadin.  However, if she does decide to  go under surgery, would restart heparin, no bolus , postoperative and  bridge  to Coumadin.     ______________________________  Felicie Morn, PA-C      Melvyn Novas, M.D.  Electronically Signed    DS/MEDQ  D:  05/31/2009  T:  05/31/2009  Job:  [id number]   cc:   Pramod P. Pearlean Brownie, MD

## 2011-02-26 NOTE — H&P (Signed)
NAMETAMARIA, [name]                  ACCOUNT NO.:  [id number]   MEDICAL RECORD NO.:  [id number]          PATIENT TYPE:  INP   LOCATION:                               FACILITY:  [facility]   PHYSICIAN:  Leighton Roach McDiarmid, M.D.DATE OF BIRTH:  11/05/1922   DATE OF ADMISSION:  10/30/2008  DATE OF DISCHARGE:                              HISTORY & PHYSICAL   This is a patient coming from [facility].  Her PCP is Dr. Brett Canales  A. Daub, MD.   Her chief complaint is bloody stools and weakness.   HISTORY OF PRESENT ILLNESS:  This is an 77 year old African American  female who has had 3 bloody stools on Wednesday.  Since then, she has  had a little bit of blood on her stool and in the water.  The patient  has been feeling really weak, so she went to the urgent care today.  In  the past, the patient has had a capsule endoscopy on January 15, 2008,  which did not show anything because it got stuck in her esophagus per  the patient.  The patient also had an upper endoscopy in October 2009  which was only significant for a small hiatal hernia.  The patient has a  past medical history that is pretty extensive.  She has a childhood  history of rheumatic fever.  She has a history of coronary artery  disease, chronic AFib, left atrial enlargement, small hiatal hernia,  osteoarthritis, congestive heart failure, pulmonary hypertension,  anemia, autoimmune hepatitis, left brain stroke with no side effects  that happened in 2002.  She has a history of 3 TIAs that occurred in  2003, 2006, and 2007.  She has a history of gout, hypothyroidism,  hyperlipidemia, chronic obstructive pulmonary disease, bilateral renal  cysts, esophageal reflux, gallstones, and bilateral pleural effusions in  the past.   Her past surgical history includes mitral valve replacement with a St.  Jude as well as a pacemaker replacement.  The patient also has had right  and left arthroscopy to her knee.   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  Retired Public house manager. She is single.  She did smoke tobacco  briefly 40 years ago, but does not currently smoke tobacco.  She has no  alcohol and no drug history.   FAMILY HISTORY:  Her mother, she believes, died of a stroke.  Her father  died of some sort of poisoning.  She is one of seven siblings.  She does  have 2 half-sisters and 1 half-brother that are still alive.  She has no  history of colon cancer, ulcer disease, or anemia in her family.   On review of systems, the patient is positive for chills, appetite  changes, weight changes.  She has had about an 18-pound weight loss in  the last 6 months, increased mucus and congestion in the back of her  throat.  She has a cough, wheezing at times, increased sputum  production, occasional diarrhea, bright red blood per rectum, left-sided  abdominal pain that was not present on admission, but has been present  in the past, nocturia, GI bleeding, and polyuria.  Otherwise, the review  of systems is negative.   The patient has allergies to TOPROL, RELAFEN, SULFA DRUGS, NOVOCAINE,  and XANAX.   The medications that this patient is currently on are:  1. Coumadin 5 mg daily except on Sundays when she takes 2.5 mg.  2. Synthroid 0.75 mg 1 tablet daily.  3. Folic acid 2 mg 1 tablet daily.  4. Cardizem 240 mg 1 tablet daily.  5. Cozaar 100 mg 1 tablet daily.  6. Nu-Iron 150 mg 1 tablet b.i.d.  7. Nexium 40 mg 1 tablet b.i.d.  8. Lasix 20 mg 1 tablet daily.  9. Potassium chloride 10 mEq 1 tablet b.i.d.  10.Colchicine 0.6 mg 1 tablet daily.   Her p.r.n. meds include:  1. ProAir HFA 8.5/25 two puffs every 4-6 hours p.r.n.  2. Albuterol inhaler 1 puff every 4 hours.  3. Combivent inhaler 2 puffs t.i.d.  4. Oxygen 2 L per minute p.r.n. shortness of breath.  5. Nasonex 50 mcg nasal spray 2 sprays in each nostril daily.   PHYSICAL EXAMINATION:  VITAL SIGNS:  Taken at the urgent care  prior to  arrival at [facility].  Her temperature was 97.9, pulse 75, respirations  16, and blood pressure 114/56.  GENERAL:  She was in no acute distress, lying in the bed, very pleasant  behavior.  HEENT:  The patient was normocephalic, atraumatic, had extraocular  movements that were intact.  Her head was moving in all directions.  No  nasal congestion or drainage.  NECK:  Supple.  CARDIOVASCULAR:  The patient has a loud St. Jude valve sound that  dominated the precordium.  She had no murmur that was heard.  LUNGS:  Clear to auscultation.  No crackles or wheezes or rhonchi.  ABDOMEN:  Soft and nontender and nondistended.  She had no hernias.  No  abnormalities.  Positive bowel sounds.  BACK:  Multiple nevi present and the kyphosis, but no rashes.  RECTAL:  The patient deferred the rectal exam as she had just had one in  urgent care.  The urgent care results showed blood and mucus from the  rectal exam.  EXTREMITIES:  Thin with no edema.  NEUROLOGIC:  The patient was awake and alert, and orientedx3.  SKIN:  No rashes or discoloration.   LABORATORY AND STUDIES:  Most of the labs were pending.  The patient had  a PT and INR that were pending, a CBC pending, a CMET pending, a TSH  pending, and a type and cross that were pending.  The only labs that  were available were the ones sent over from [place].  She had a CBC at  the urgent care today with white blood cell count of 4.9, hemoglobin of  6.4, hematocrit of 21.5, and platelets of 268.  Her PCP also sent  previous records showing a hemoglobin on September 07, 2008, at 8.7, on  September 12, 2008, at 9.2, and on September 26, 2008, at 9.1.   ASSESSMENT AND PLAN:  This is an 77 year old African American female  with bloody stools and anemia.  1. Gastrointestinal bleed.  The patient has a history of several      admissions for gastrointestinal bleeds in the past leading to      anemia.  The patient has been seen by Dr. Matthias Hughs and has  had an  upper endoscopy done on October 2009.  Plan to type and cross the      patient and transfuse 2 units and reassess and possibly give more      blood if needed.  Also plan to get a PT, INR, CBC, and CMET prior      to the transfusion.  Then in the morning, plan to reconsult Dr.      Matthias Hughs to have him reassess to reevaluate the patient as      necessary.  Plan to continue her PPI (Nexium).  2. Anemia.  This is likely due to her GI bleed.  The patient has a      history of anemia that is lifelong.  Her hemoglobin is usually      between 8 and 9.  Her current hemoglobin is 6.4.  We will continue      to look for causes.  We will hold the Coumadin at this time as the      patient is at increased risk for bleed.  Continue the Nu-Iron      supplement.  3. Chronic atrial fibrillation.  The patient is on Coumadin therapy      for chronic atrial fibrillation, and because the patient also has a      St. Jude valve as the mitral valve replacement, we will plan to      hold the Coumadin for now and to check an INR.  We will reassess if      the patient needs to have the Coumadin added back tonight or in the      a.m.  Plan to continue Cardizem at this time.  4. Congestive heart failure.  The patient has a history of congestive      heart failure.  She is on Lasix and potassium.  We plan to continue      the home medications.  5. Gout.  The patient has a history of gout and takes colchicine at      home.  Plan to continue this home medication as well.  6. Hyperlipidemia.  The patient has a history of hyperlipidemia, but      is not on medication for hyperlipidemia at this time.  Plan to      follow up with PCP on this matter as an outpatient.  7. Hypothyroidism.  The patient has a history of hypothyroidism and is      currently taking Synthroid.  We plan to check a TSH level as low      thyroid can also make the patient feel weak and tired.  8. Chronic obstructive pulmonary disease.  The  patient has a history      of chronic obstructive pulmonary disease and has p.r.n. medications      available if she needs them including albuterol, Combivent, ProAir,      and oxygen.  She is currently having no symptoms of shortness of      breath or difficulty moving air.  9. Prophylaxis.  The patient will have sequential compression devices      for prophylaxis.  10.Fluids, electrolytes, nutrition/gastrointestinal.  The patient will      be started on a heart-healthy diet.   DISPOSITION:  The patient will be sent home pending clinical improvement  and also depending on the tests that the specialist would like to do  while the patient is in-house.      Jamie Brookes, MD  Electronically Signed      Tawanna Cooler  Algis Downs McDiarmid, M.D.  Electronically Signed    AS/MEDQ  D:  10/30/2008  T:  10/31/2008  Job:  [id number]

## 2011-02-26 NOTE — Consult Note (Signed)
[name], [name]                  ACCOUNT NO.:  [id number]   MEDICAL RECORD NO.:  [id number]          PATIENT TYPE:  INP   LOCATION:  2308                         FACILITY:  [facility]   PHYSICIAN:  Casimiro Needle L. Reynolds, M.D.DATE OF BIRTH:  Apr 08, 1923   DATE OF CONSULTATION:  06/08/2009  DATE OF DISCHARGE:                                 CONSULTATION   REQUESTING PHYSICIAN:  Evelene Croon, MD   REASON FOR EVALUATION:  Code stroke.   HISTORY OF PRESENT ILLNESS:  This is an inpatient consultation  evaluation of this existing [facility] patient, an  77 year old woman with whom I am familiar from previous hospital visits  for known cerebrovascular disease and suspected cardioembolic TIAs.  I  last saw her in the office in 2007.  She has several other medical  problems, including atrial fibrillation, on chronic anticoagulation,  history of pacemaker placement, pulmonary hypertension, GI bleeds in the  past, and mitral valve replacement in 2001.  She was admitted after  undergoing cardiac catheterization on August 17, which demonstrated  critical aortic stenosis.  After lengthy discussion, she decided to  proceed with valve replacement surgery, and underwent surgery with  placement of a pericardial valve on August 23.  She was extubated  without incident early in the morning on August 24, but according to her  daughter, has been drooling a lot and has not seemed quiet herself.  Yesterday, she seen like she was weak in both hands, but did not have  any obvious focal deficits.  This morning at 4 a.m., she was ambulated  by the nursing staff and seemed to be walking normally.  At 6 o'clock,  she was seen to be holding a cup in both hands.  At 7 a.m., she was  checked and found to be slumped over in the chair, not moving her left  side.  Her blood pressure also got up considerably coming to the 190  systolic range.  A code stroke was called shortly thereafter.  CT of the  head  was performed and raised the possibility of clot in the distal ICA.  The patient has been restarted on Coumadin, with an INR of 1.3.  Presently, she is awake, but not aware of deficits, complaining of some  incisional pain.   PAST MEDICAL HISTORY:  Numerous medical problems as above.   OTHER MEDICAL PROBLEMS:  1. Hypothyroidism.  2. History pleural effusions.  3. COPD.  4. Hypertension.  5. Reflux disease.  6. Osteoarthritis.  7. Gout.   FAMILY/SOCIAL/REVIEW OF SYSTEMS:  Per associated admission H and P.   MEDICATIONS:  Her present list of medications include scheduled Tylenol,  NovoLog insulin, Coumadin, folate, guaifenesin, Dulcolax, Colace,  Nexium, levothyroxine, and intravenous fluids.  She is off all drips at  present.   PHYSICAL EXAMINATION:  VITAL SIGNS:  Blood pressure 178/80, pulse 72 and  paced, and respirations 18.  GENERAL:  This is an elderly and frail-appearing woman, supine in  hospital bed, and a little bit of respiratory distress.  HEENT:  Head, cranium is normocephalic and atraumatic.  Oropharynx  benign.  NECK:  Supple without carotid bruits.  HEART:  Regular rhythm.  NEUROLOGIC:  Mental status; she is drowsy, but alert to voice.  She is  able to answer orientation questions.  Cranial nerves, pupils equal and  reactive.  She has good horizontal eye movements, but seems to see well  off to the left.  She has exhibited left facial droop.  Motor, normal  bulk and tone.  She lifts right side of the bed, but does not  demonstrate antigravity strength in the left upper or lower extremity.  Sensation, she is able to withdraw the left lower extremity to pain, not  as vigorously as the right.  Cerebellar, intact on the right.  Gait is  deferred.  NIH stroke scale score performed by the stroke RN is 12.   IMPRESSION:  1. Acute cardioembolic stroke with likely right carotid occlusion and      resultant left hemiparesis, onset some time between 4 and 7 a.m.       today.  2. Status post aortic valve replacement August 23.  3. Multiple medical comorbidities as above.   DISCUSSION:  Her CT suggests a right carotid occlusion, and she will  likely have a sizable stroke no matter what we do.  T-PA is not an  option secondary to recent surgery.  Catheter-based intervention is  possible in this time frame, but this likely only to limit the damage of  her stroke slightly if at all, and introduces the risk of ventilator,  renal failure, dye nephropathy, and reperfusion bleed, any of which  could be fatal.   PLAN:  I had a long discussion with her daughter about the risks and  benefits.  It is my feeling that the risk of catheter-based intervention  outweigh any potential benefit, and that her stroke should be treated  medically.  After discussion, her daughter was agreeable to that.  We  will titrate her blood pressure to a mean arterial pressure of 90-100,  run fluids and oxygen, and continue aspirin and Coumadin.  We will check  CTs and frequent neuro checks in next couple of days.  Stroke Service to  follow.      Michael L. Thad Ranger, M.D.  Electronically Signed     MLR/MEDQ  D:  06/08/2009  T:  06/09/2009  Job:  [id number]

## 2011-02-26 NOTE — Consult Note (Signed)
[name], [name]                  ACCOUNT NO.:  [id number]   MEDICAL RECORD NO.:  [id number]          PATIENT TYPE:  INP   LOCATION:  3739                         FACILITY:  [facility]   PHYSICIAN:  Petra Kuba, M.D.    DATE OF BIRTH:  07-31-1923   DATE OF CONSULTATION:  12/01/2008  DATE OF DISCHARGE:                                 CONSULTATION   We were asked to see [name] today in consultation for GI bleeding by  Dr. Ancil Boozer of the [facility].   HISTORY OF PRESENT ILLNESS:  This is a very pleasant 77 year old female,  who is well known to our practice for recurrent GI bleeding.  She  presents today with a chief complaint of bright red blood per rectum  this morning followed by a large melenic stool.  The only pain she is  having is in her left lower quadrant that seems to radiate around to her  lower left thigh.  This seems to be a chronic pain for her.  She tells  me that prior to this morning, her bowel movements have been fairly  normal and brown.  Her appetite has been good.  She has had no other  pain or complaints.  The patient is on Coumadin for atrial fibrillation.  Her INR is currently 4.4.  She had a normal colonoscopy in 2007 by Dr.  Carman Ching.  Her last upper endoscopy in October 2009 was done by Dr.  Matthias Hughs, who found a small hiatal hernia.  No source of bleeding.  She  did have an attempted capsule endo in 2009 that failed as the capsule  stayed in the esophagus for 8 hours.   Her primary care physician is Dr. Earl Lites.  Her gastroenterologist is  Dr. Charlott Rakes.   CURRENT MEDICATIONS:  Coumadin, Synthroid, folic acid, Cardizem, Cozaar,  iron, Nexium, Lasix, potassium, acetaminophen, colchicine, albuterol,  oxygen, and Nasonex.   She has allergies to SULFA, RELAFEN, IBUPROFEN, NOVOCAIN, MORPHINE  SULFATE, CRESTOR, TOPROL, and XANAX.   Past medical history is significant for recurrent GI bleeding with  negative endoscopy in  October 2009, negative colonoscopy in 2007, failed  capsule endo attempt in 2009 secondary to the camera being caught in her  esophagus for 8 hours.  Rest of her past medical history is as follows:  She has congestive heart failure, coronary artery disease, chronic  atrial fibrillation, osteoarthritis, anemia, autoimmune hepatitis,  history of CVA with no residual deficits, gout, hypothyroidism,  hyperlipidemia, COPD, bilateral renal cysts, GERD, gallstones, pleural  effusions.  She has a St. Jude mitral valve in place, as well as a  pacemaker and nondistended.  She has good bowel sounds.   REVIEW OF SYSTEMS:  Negative for palpitations, shortness of breath,  fever, and anorexia.   SOCIAL HISTORY:  Negative for alcohol, tobacco, and drugs.   FAMILY HISTORY:  Negative for colon cancer and ulcer disease.   PHYSICAL EXAMINATION:  GENERAL:  She is alert and oriented, very  pleasant to speak with.  VITAL SIGNS:  Temperature 97.6, pulse 77, respirations  16, blood  pressure is 146/69.  HEART:  Currently has a regular rate and rhythm.  LUNGS:  Expiratory crackles bilaterally.  ABDOMEN:  Thin, soft, nontender, nondistended with good bowel sounds.   LABORATORY DATA:  PT is 45.6, INR is 4.4.  BUN 15, creatinine 1.46.  Platelets 190,000, WBC 3.9.   RADIOLOGICAL EXAMINATION:  She has a chest x-ray pending.   ASSESSMENT:  Dr. Vida Rigger has seen and examined the patient, collected  the history, and reviewed her chart.  His impression is this is a very  pleasant 77 year old female with recurrent gastrointestinal bleeding.  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  She also has mild  left lower quadrant pain that is chronic in nature.  She currently has a  supratherapeutic INR.   PLAN:  Again, I suspect small bowel AVMs, but we will obtain a stat  nuclear medicine bleeding scan on the chance that we might be able to  find something that would be helped with arteriography.  Agree with  IV  Protonix.  Coumadin level needs close monitoring to ensure that does not  rise above 3.5.  We will follow with you.  Thanks very much for this  consultation.      Stephani Police, PA    ______________________________  Petra Kuba, M.D.    MLY/MEDQ  D:  12/01/2008  T:  12/02/2008  Job:  [id number]   cc:   Shirley Friar, MD  [place] Cleta Alberts, M.D.

## 2011-03-01 NOTE — Procedures (Signed)
[facility]  Patient:    [name], [name]                         MRN: [id number] Proc. Date: 11/05/00 Adm. Date:  [id number] Disc. Date: [id number] Attending:  Virgina Evener                           Procedure Report  PROCEDURE:  Bone marrow biopsy and aspiration.  ONCOLOGIST:  Miquel Dunn Catha Gosselin, M.D.  INDICATION:  Persistent anemia despite Epogen and iron therapy.  DESCRIPTION OF PROCEDURE:  From the left posterior iliac crest, bone marrow biopsy and aspiration were performed under 2% lidocaine local with disposable Jamshidi-type needle.  She is on Coumadin so that was some extra bleeding but it was otherwise well-tolerated and there were no obvious complications. Aspirate and biopsy are sent for routine processing.  She will call [phone] for any problems or questions and will return to see me next week to go over the results.  She will remove her bandage tomorrow. DD:  11/05/00 TD:  11/05/00 Job: [id number]

## 2011-03-01 NOTE — Discharge Summary (Signed)
[name], [name]                  ACCOUNT NO.:  [id number]   MEDICAL RECORD NO.:  [id number]          PATIENT TYPE:  INP   LOCATION:  4707                         FACILITY:  [facility]   PHYSICIAN:  Paula Compton, MD        DATE OF BIRTH:  1923-02-17   DATE OF ADMISSION:  02/18/2009  DATE OF DISCHARGE:  02/19/2009                               DISCHARGE SUMMARY   PRIMARY CARE PHYSICIAN:  Brett Canales A. Cleta Alberts, MD, at [facility].   DISCHARGE DIAGNOSES:  1. Atypical chest pain.  2. Congestive heart failure with ejection fraction 50-55% by      echocardiogram on Feb 15, 2009, that also showed severely dilated      left atrium.  3. Recurrent gastrointestinal bleed secondary to cecal arteriovenous      malformation with the last admission in February 2010.  4. Cecal arteriovenous malformation.  5. Atrial fibrillation with St. Jude mitral valve pacemaker.  6. Questionable multiple myeloma.  The patient is seen by Dr. Ewing Schlein      for this.  7. Hypothyroidism.  8. Pleural effusion in her past medical history.  9. Chronic obstructive pulmonary disease.  10.Hypertension.  11.Gastroesophageal reflux disease.  12.Pulmonary hypertension.  13.Osteoarthritis.  14.Gout.   DISCHARGE MEDICATIONS:  1. Colchicine 0.6 mg p.o. daily.  2. Diltiazem 120 mg p.o. daily.  3. Flonase 2 mg spray in each nose daily.  4. Folic acid 2 mg p.o. daily.  5. Lasix 20 mg p.o. daily.  6. Combivent 2 puffs inhaled 3 times daily.  7. Nu-Iron 150 mg p.o. twice daily.  8. Cozaar 100 mg one-half tablet p.o. daily.  9. Nexium 40 mg p.o. twice daily.  10.Potassium chloride 10 mEq p.o. daily.  11.Coumadin as directed previously.  12.Synthroid 75 mcg p.o. once daily.   CONSULTATIONS:  None.   PROCEDURES:  1. Chest x-ray on Feb 18, 2009, that showed an area of irregular      density in the right mid to upper lung zone.  This could represent      pneumonia or a mass.  Stable cardiomegaly and changes of COPD.  2.  Chest CT without contrast on Feb 19, 2009, that showed no      abnormality is seen in the right mid upper lung field as questioned      on chest x-ray.  Bibasilar linear atelectasis was present.      Moderate cardiomegaly.   PERTINENT LABORATORY DATA:  1. Serial cardiac enzymes were negative.  CBC with a white blood cells      3.0, hemoglobin 9.8, hematocrit 28.6, platelet 144.  2. BMET with a creatinine of 1.30, otherwise everything in BMET within      normal limits.  3. Lipase of 23.  4. LFTs with AST of 42 and ALT of 17.   BRIEF HOSPITAL COURSE:  This is a very pleasant [age]-year-old female who  was admitted for a complaint of right-sided chest pain that was located  described as behind her pacemaker, although her cardiac enzymes had been  negative when  the patient first entered the emergency room.  Chest x-ray  with irregular density was very concerning for malignant process.  The  patient was admitted overnight so that we can follow serial cardiac  enzymes and also to get the chest CT.  1. Chest pain is atypical in nature.  She was ruled out with serial      cardiac enzymes and with repeated EKG that showed no changes in ST      segments.  The chest pain may be secondary to some chronic pain      that she has in that area.  The irregular density seen on chest x-      ray was ruled out with a CT that was normal.  The patient was then      discharged home to continue all her previous medications.  2. CHF with ejection fraction between 50-55%.  The patient is to      continue on Lasix, potassium chloride, Cozaar.  She was not volume      overloaded on admission, and she had no edema or crackles on exam.  3. Recurrent GI bleed secondary to cecal AVM.  This has been stable      since admission in February 2010.  Hemoglobin at this time is      stable.  The patient has no current bleed.  4. Chronic renal insufficiency.  The patient was admitted with a      creatinine of 1.35 and at  discharge with creatinine 1.30.  The      patient has had creatinine as high as 1.6 and 1.9 in the previous      admissions.  We were concerned that her creatinine may be worsened      with IV contrast for her CT and consulted Radiology who advised      that when we were looking for in the CT of the chest would be      adequate with just a CT without contrast.  Her creatinine at this      time is stable.  5. Hypothyroidism.  The patient is to continue her home dose of      Synthroid 75 mcg.  6. COPD.  The patient has Combivent and Flonase.  She is not coughing      at this time and has not required O2 at this time.  She has been      afebrile.  7. Atrial fibrillation with St. Jude valve pacemaker.  She is on      lifetime Coumadin.  Her INR was supratherapeutic at 3.8.  The      patient was discharged home with instructions to not take Coumadin      for today and to resume her normal schedule the following day.  8. Gout.  The patient is on home dose of colchicine.  9. GERD.  The patient is on her home dose of Nexium 40 mg daily.  10.Anemia.  The patient is on folate and Nu-Iron.   DISCHARGE INSTRUCTIONS:  Activity:  No restrictions.  Diet:  Low-sodium,  heart-healthy diet.  Wound care:  Not applicable.   FOLLOW UP APPOITMENT:  The patient is to call Dr. Cleta Alberts at [facility] for followup appointment.  We were not able to make one for  the patient since it was a weekend.  The patient was discharged home in  stable medical condition.      Angeline Slim, MD  Electronically Signed  Paula Compton, MD  Electronically Signed    CT/MEDQ  D:  02/22/2009  T:  02/23/2009  Job:  [id number]

## 2011-07-05 LAB — BASIC METABOLIC PANEL
BUN: 31 — ABNORMAL HIGH
CO2: 22
Calcium: 9.7
Chloride: 107
Creatinine, Ser: 2.01 — ABNORMAL HIGH
GFR calc Af Amer: 29 — ABNORMAL LOW
GFR calc non Af Amer: 24 — ABNORMAL LOW
Glucose, Bld: 90
Potassium: 4
Sodium: 139

## 2011-07-05 LAB — ABO/RH: ABO/RH(D): O POS

## 2011-07-05 LAB — URINALYSIS, ROUTINE W REFLEX MICROSCOPIC
Glucose, UA: NEGATIVE
Hgb urine dipstick: NEGATIVE
Ketones, ur: NEGATIVE
Nitrite: NEGATIVE
Protein, ur: NEGATIVE
Specific Gravity, Urine: 1.02
Urobilinogen, UA: 1
pH: 5.5

## 2011-07-05 LAB — COMPREHENSIVE METABOLIC PANEL
Alkaline Phosphatase: 73
BUN: 25 — ABNORMAL HIGH
Creatinine, Ser: 1.56 — ABNORMAL HIGH
Glucose, Bld: 81
Potassium: 4
Total Bilirubin: 1.5 — ABNORMAL HIGH
Total Protein: 8.2

## 2011-07-05 LAB — URINE MICROSCOPIC-ADD ON

## 2011-07-05 LAB — CBC
HCT: 25.3 — ABNORMAL LOW
HCT: 26.3 — ABNORMAL LOW
Hemoglobin: 8.4 — ABNORMAL LOW
Hemoglobin: 8.8 — ABNORMAL LOW
MCHC: 33.2
MCHC: 33.5
MCV: 86.3
MCV: 87.6
Platelets: 198
RBC: 2.93 — ABNORMAL LOW
RBC: 3.25 — ABNORMAL LOW
RBC: 3.41 — ABNORMAL LOW
RDW: 15.3
RDW: 15.4
RDW: 16.3 — ABNORMAL HIGH
WBC: 3.9 — ABNORMAL LOW
WBC: 4

## 2011-07-05 LAB — TYPE AND SCREEN
ABO/RH(D): O POS
Antibody Screen: NEGATIVE

## 2011-07-05 LAB — DIFFERENTIAL
Basophils Absolute: 0
Basophils Relative: 0
Eosinophils Absolute: 0.1
Eosinophils Relative: 1
Lymphocytes Relative: 12
Lymphs Abs: 0.5 — ABNORMAL LOW
Monocytes Absolute: 0.2
Monocytes Relative: 5
Neutro Abs: 3.2
Neutrophils Relative %: 82 — ABNORMAL HIGH

## 2011-07-05 LAB — PROTIME-INR
INR: 2 — ABNORMAL HIGH
INR: 4.5 — ABNORMAL HIGH
Prothrombin Time: 22.9 — ABNORMAL HIGH
Prothrombin Time: 44.3 — ABNORMAL HIGH

## 2011-07-05 LAB — PREPARE RBC (CROSSMATCH)

## 2011-07-05 LAB — PREPARE FRESH FROZEN PLASMA

## 2011-07-08 LAB — CBC
HCT: 27.2 — ABNORMAL LOW
HCT: 28 — ABNORMAL LOW
HCT: 30.8 — ABNORMAL LOW
HCT: 30.8 — ABNORMAL LOW
HCT: 32.8 — ABNORMAL LOW
Hemoglobin: 9.2 — ABNORMAL LOW
Hemoglobin: 9.3 — ABNORMAL LOW
Hemoglobin: 10.8 — ABNORMAL LOW
Hemoglobin: 10.9 — ABNORMAL LOW
Hemoglobin: 9.8 — ABNORMAL LOW
MCHC: 32.8
MCHC: 33.2
MCHC: 33.6
MCHC: 32.9
MCHC: 33.6
MCHC: 33.8
MCV: 86.9
MCV: 86.9
MCV: 87.3
MCV: 87.3
MCV: 83.8
MCV: 85.8
MCV: 86.1
MCV: 86.3
Platelets: 215
Platelets: 232
Platelets: 164
Platelets: 176
Platelets: 178
Platelets: 207
RBC: 3.12 — ABNORMAL LOW
RBC: 3.44 — ABNORMAL LOW
RBC: 3.57 — ABNORMAL LOW
RBC: 3.72 — ABNORMAL LOW
RBC: 3.78 — ABNORMAL LOW
RDW: 15.5
RDW: 16.6 — ABNORMAL HIGH
RDW: 15.8 — ABNORMAL HIGH
RDW: 16.4 — ABNORMAL HIGH
WBC: 3.7 — ABNORMAL LOW
WBC: 4.6
WBC: 4.6
WBC: 8.2

## 2011-07-08 LAB — COMPREHENSIVE METABOLIC PANEL
ALT: 13
Albumin: 4
BUN: 20
BUN: 21
Calcium: 9.7
Chloride: 107
Creatinine, Ser: 1.41 — ABNORMAL HIGH
Creatinine, Ser: 1.52 — ABNORMAL HIGH
Glucose, Bld: 84
Sodium: 142
Total Bilirubin: 2.5 — ABNORMAL HIGH
Total Protein: 7.2
Total Protein: 7.9

## 2011-07-08 LAB — PROTIME-INR
INR: 1.6 — ABNORMAL HIGH
INR: 2.2 — ABNORMAL HIGH
INR: 2.9 — ABNORMAL HIGH
INR: 3.1 — ABNORMAL HIGH
INR: 3.1 — ABNORMAL HIGH
INR: 3.5 — ABNORMAL HIGH
INR: 3.9 — ABNORMAL HIGH
Prothrombin Time: 27.5 — ABNORMAL HIGH
Prothrombin Time: 33.2 — ABNORMAL HIGH
Prothrombin Time: 33.3 — ABNORMAL HIGH
Prothrombin Time: 36.6 — ABNORMAL HIGH
Prothrombin Time: 39.6 — ABNORMAL HIGH

## 2011-07-08 LAB — BASIC METABOLIC PANEL
BUN: 21
BUN: 29 — ABNORMAL HIGH
BUN: 17
CO2: 28
CO2: 29
CO2: 30
CO2: 25
Calcium: 9.8
Calcium: 9.2
Calcium: 9.3
Chloride: 102
Chloride: 104
Chloride: 109
Chloride: 104
Creatinine, Ser: 1.64 — ABNORMAL HIGH
Creatinine, Ser: 1.28 — ABNORMAL HIGH
GFR calc Af Amer: 48 — ABNORMAL LOW
GFR calc Af Amer: 44 — ABNORMAL LOW
GFR calc Af Amer: 47 — ABNORMAL LOW
GFR calc Af Amer: 48 — ABNORMAL LOW
GFR calc non Af Amer: 36 — ABNORMAL LOW
Glucose, Bld: 85
Glucose, Bld: 92
Glucose, Bld: 94
Potassium: 3.5
Potassium: 4.4
Potassium: 3.8
Sodium: 138
Sodium: 143
Sodium: 140
Sodium: 140

## 2011-07-08 LAB — CROSSMATCH: ABO/RH(D): O POS

## 2011-07-08 LAB — DIFFERENTIAL
Basophils Absolute: 0
Basophils Absolute: 0
Basophils Relative: 0
Basophils Relative: 0
Eosinophils Absolute: 0.1
Eosinophils Relative: 2
Eosinophils Relative: 3
Lymphocytes Relative: 13
Lymphs Abs: 0.5 — ABNORMAL LOW
Lymphs Abs: 0.6 — ABNORMAL LOW
Monocytes Absolute: 0.5
Monocytes Relative: 11
Neutro Abs: 4.3
Neutro Abs: 3.1
Neutrophils Relative %: 81 — ABNORMAL HIGH
Neutrophils Relative %: 70

## 2011-07-08 LAB — I-STAT 8, (EC8 V) (CONVERTED LAB)
BUN: 25 — ABNORMAL HIGH
Bicarbonate: 22.1
Chloride: 109
HCT: 26 — ABNORMAL LOW
Hemoglobin: 8.8 — ABNORMAL LOW
Operator id: 151321
Potassium: 4.3
Sodium: 140

## 2011-07-08 LAB — CK TOTAL AND CKMB (NOT AT ARMC)
CK, MB: 1.5
Relative Index: 1.1
Total CK: 141

## 2011-07-08 LAB — PREPARE RBC (CROSSMATCH)

## 2011-07-08 LAB — TSH: TSH: 1.763

## 2011-07-08 LAB — IMMUNOFIXATION ELECTROPHORESIS: IgG (Immunoglobin G), Serum: 1620 — ABNORMAL HIGH

## 2011-07-08 LAB — RETICULOCYTES
RBC.: 3.93
Retic Count, Absolute: 59
Retic Ct Pct: 1.5

## 2011-07-08 LAB — HEMOGLOBIN: Hemoglobin: 10.9 — ABNORMAL LOW

## 2011-07-08 LAB — CARDIAC PANEL(CRET KIN+CKTOT+MB+TROPI)
CK, MB: 1.6
Troponin I: 0.03

## 2011-07-08 LAB — URIC ACID: Uric Acid, Serum: 7.4 — ABNORMAL HIGH

## 2011-07-08 LAB — TROPONIN I: Troponin I: 0.04

## 2011-07-08 LAB — POCT CARDIAC MARKERS
Myoglobin, poc: 98.5
Troponin i, poc: <0.05

## 2011-07-08 LAB — B-NATRIURETIC PEPTIDE (CONVERTED LAB): Pro B Natriuretic peptide (BNP): 311 — ABNORMAL HIGH

## 2011-07-09 LAB — CBC
HCT: 37.5
MCV: 88.2
RDW: 20.3 — ABNORMAL HIGH
WBC: 4.6

## 2011-07-09 LAB — POCT CARDIAC MARKERS
CKMB, poc: <1 — ABNORMAL LOW
Myoglobin, poc: 105
Myoglobin, poc: 99.4
Operator id: 294521
Operator id: 294521
Troponin i, poc: <0.05

## 2011-07-09 LAB — POCT I-STAT, CHEM 8
Hemoglobin: 12.9
Sodium: 140
TCO2: 23

## 2011-07-09 LAB — COMPREHENSIVE METABOLIC PANEL
ALT: 12
CO2: 21
Calcium: 9.2
GFR calc non Af Amer: 34 — ABNORMAL LOW
Glucose, Bld: 63 — ABNORMAL LOW
Sodium: 135
Total Bilirubin: 1.1

## 2011-07-09 LAB — MAGNESIUM: Magnesium: 2

## 2011-07-09 LAB — DIFFERENTIAL
Basophils Absolute: 0
Eosinophils Absolute: 0.1
Eosinophils Relative: 2
Monocytes Absolute: 0.4
Neutrophils Relative %: 78 — ABNORMAL HIGH

## 2011-07-09 LAB — CK TOTAL AND CKMB (NOT AT ARMC): Relative Index: INVALID

## 2011-07-09 LAB — CARDIAC PANEL(CRET KIN+CKTOT+MB+TROPI): Relative Index: INVALID

## 2011-07-09 LAB — TROPONIN I: Troponin I: 0.05

## 2011-07-15 LAB — BASIC METABOLIC PANEL
BUN: 29 — ABNORMAL HIGH
Chloride: 105
GFR calc Af Amer: 31 — ABNORMAL LOW
GFR calc non Af Amer: 26 — ABNORMAL LOW
Potassium: 4.3
Sodium: 137

## 2011-07-15 LAB — CBC
HCT: 20.9 — ABNORMAL LOW
Hemoglobin: 6.9 — CL
MCV: 83.7
RBC: 2.5 — ABNORMAL LOW
WBC: 4.2

## 2011-07-15 LAB — CROSSMATCH: Antibody Screen: NEGATIVE

## 2011-07-15 LAB — HEMOGLOBIN AND HEMATOCRIT, BLOOD
HCT: 25 — ABNORMAL LOW
HCT: 30 — ABNORMAL LOW
Hemoglobin: 8.1 — ABNORMAL LOW
Hemoglobin: 9.9 — ABNORMAL LOW

## 2011-07-15 LAB — APTT: aPTT: 40 — ABNORMAL HIGH

## 2011-07-15 LAB — RETICULOCYTES
RBC.: 2.51 — ABNORMAL LOW
Retic Ct Pct: 2.7

## 2012-02-04 ENCOUNTER — Emergency Department (HOSPITAL_COMMUNITY): Payer: Medicare Other

## 2012-02-04 ENCOUNTER — Encounter (HOSPITAL_COMMUNITY): Payer: Self-pay | Admitting: *Deleted

## 2012-02-04 ENCOUNTER — Inpatient Hospital Stay (HOSPITAL_COMMUNITY)
Admission: EM | Admit: 2012-02-04 | Discharge: 2012-02-10 | DRG: 689 | Disposition: A | Discharge status: Skilled Nursing Facility | Payer: Medicare Other | Attending: Internal Medicine | Admitting: Internal Medicine

## 2012-02-04 DIAGNOSIS — R1310 Dysphagia, unspecified: Secondary | ICD-10-CM | POA: Diagnosis present

## 2012-02-04 DIAGNOSIS — J4489 Other specified chronic obstructive pulmonary disease: Secondary | ICD-10-CM | POA: Diagnosis present

## 2012-02-04 DIAGNOSIS — G929 Unspecified toxic encephalopathy: Secondary | ICD-10-CM | POA: Diagnosis present

## 2012-02-04 DIAGNOSIS — Z8679 Personal history of other diseases of the circulatory system: Secondary | ICD-10-CM

## 2012-02-04 DIAGNOSIS — F341 Dysthymic disorder: Secondary | ICD-10-CM | POA: Diagnosis present

## 2012-02-04 DIAGNOSIS — R410 Disorientation, unspecified: Secondary | ICD-10-CM | POA: Diagnosis present

## 2012-02-04 DIAGNOSIS — F419 Anxiety disorder, unspecified: Secondary | ICD-10-CM | POA: Diagnosis present

## 2012-02-04 DIAGNOSIS — N390 Urinary tract infection, site not specified: Principal | ICD-10-CM | POA: Diagnosis present

## 2012-02-04 DIAGNOSIS — I4891 Unspecified atrial fibrillation: Secondary | ICD-10-CM | POA: Diagnosis present

## 2012-02-04 DIAGNOSIS — E860 Dehydration: Secondary | ICD-10-CM | POA: Diagnosis present

## 2012-02-04 DIAGNOSIS — M1990 Unspecified osteoarthritis, unspecified site: Secondary | ICD-10-CM | POA: Diagnosis present

## 2012-02-04 DIAGNOSIS — D649 Anemia, unspecified: Secondary | ICD-10-CM | POA: Diagnosis present

## 2012-02-04 DIAGNOSIS — F0393 Unspecified dementia, unspecified severity, with mood disturbance: Secondary | ICD-10-CM | POA: Diagnosis present

## 2012-02-04 DIAGNOSIS — K219 Gastro-esophageal reflux disease without esophagitis: Secondary | ICD-10-CM | POA: Diagnosis present

## 2012-02-04 DIAGNOSIS — I517 Cardiomegaly: Secondary | ICD-10-CM | POA: Diagnosis present

## 2012-02-04 DIAGNOSIS — J449 Chronic obstructive pulmonary disease, unspecified: Secondary | ICD-10-CM | POA: Diagnosis present

## 2012-02-04 DIAGNOSIS — J159 Unspecified bacterial pneumonia: Secondary | ICD-10-CM | POA: Insufficient documentation

## 2012-02-04 DIAGNOSIS — I2789 Other specified pulmonary heart diseases: Secondary | ICD-10-CM | POA: Diagnosis present

## 2012-02-04 DIAGNOSIS — F0280 Dementia in other diseases classified elsewhere without behavioral disturbance: Secondary | ICD-10-CM | POA: Diagnosis present

## 2012-02-04 DIAGNOSIS — K754 Autoimmune hepatitis: Secondary | ICD-10-CM | POA: Insufficient documentation

## 2012-02-04 DIAGNOSIS — I69991 Dysphagia following unspecified cerebrovascular disease: Secondary | ICD-10-CM

## 2012-02-04 DIAGNOSIS — G92 Toxic encephalopathy: Secondary | ICD-10-CM | POA: Diagnosis present

## 2012-02-04 DIAGNOSIS — N281 Cyst of kidney, acquired: Secondary | ICD-10-CM | POA: Insufficient documentation

## 2012-02-04 DIAGNOSIS — I272 Pulmonary hypertension, unspecified: Secondary | ICD-10-CM | POA: Diagnosis present

## 2012-02-04 DIAGNOSIS — R569 Unspecified convulsions: Secondary | ICD-10-CM | POA: Diagnosis present

## 2012-02-04 DIAGNOSIS — Z950 Presence of cardiac pacemaker: Secondary | ICD-10-CM

## 2012-02-04 DIAGNOSIS — I2510 Atherosclerotic heart disease of native coronary artery without angina pectoris: Secondary | ICD-10-CM | POA: Diagnosis present

## 2012-02-04 DIAGNOSIS — G459 Transient cerebral ischemic attack, unspecified: Secondary | ICD-10-CM

## 2012-02-04 DIAGNOSIS — F0150 Vascular dementia without behavioral disturbance: Secondary | ICD-10-CM | POA: Diagnosis present

## 2012-02-04 DIAGNOSIS — Z8719 Personal history of other diseases of the digestive system: Secondary | ICD-10-CM

## 2012-02-04 DIAGNOSIS — I672 Cerebral atherosclerosis: Secondary | ICD-10-CM | POA: Diagnosis present

## 2012-02-04 DIAGNOSIS — E039 Hypothyroidism, unspecified: Secondary | ICD-10-CM | POA: Diagnosis present

## 2012-02-04 DIAGNOSIS — Z7901 Long term (current) use of anticoagulants: Secondary | ICD-10-CM

## 2012-02-04 HISTORY — DX: Unspecified bacterial pneumonia: J15.9

## 2012-02-04 HISTORY — DX: Heart failure, unspecified: I50.9

## 2012-02-04 HISTORY — DX: Essential (primary) hypertension: I10

## 2012-02-04 HISTORY — DX: Hypothyroidism, unspecified: E03.9

## 2012-02-04 HISTORY — DX: Dysphagia following cerebral infarction: I69.391

## 2012-02-04 HISTORY — DX: Shortness of breath: R06.02

## 2012-02-04 HISTORY — DX: Cerebral infarction, unspecified: I63.9

## 2012-02-04 HISTORY — DX: Idiopathic aseptic necrosis of unspecified femur: M87.059

## 2012-02-04 HISTORY — DX: Gastro-esophageal reflux disease without esophagitis: K21.9

## 2012-02-04 HISTORY — DX: Pulmonary hypertension, unspecified: I27.20

## 2012-02-04 HISTORY — DX: Atherosclerotic heart disease of native coronary artery without angina pectoris: I25.10

## 2012-02-04 HISTORY — DX: Dependence on respirator (ventilator) status: Z99.11

## 2012-02-04 HISTORY — DX: Autoimmune hepatitis: K75.4

## 2012-02-04 HISTORY — DX: Chronic kidney disease, unspecified: N18.9

## 2012-02-04 HISTORY — DX: Major depressive disorder, single episode, unspecified: F32.9

## 2012-02-04 HISTORY — DX: Personal history of other diseases of the circulatory system: Z86.79

## 2012-02-04 HISTORY — DX: Unspecified atrial fibrillation: I48.91

## 2012-02-04 HISTORY — DX: Pleural effusion, not elsewhere classified: J90

## 2012-02-04 HISTORY — DX: Cardiomegaly: I51.7

## 2012-02-04 HISTORY — DX: Anxiety disorder, unspecified: F41.9

## 2012-02-04 HISTORY — DX: Presence of cardiac pacemaker: Z95.0

## 2012-02-04 HISTORY — DX: Cyst of kidney, acquired: N28.1

## 2012-02-04 HISTORY — DX: Personal history of other diseases of the digestive system: Z87.19

## 2012-02-04 HISTORY — DX: Encounter for other specified aftercare: Z51.89

## 2012-02-04 HISTORY — DX: Hyperlipidemia, unspecified: E78.5

## 2012-02-04 HISTORY — DX: Unspecified osteoarthritis, unspecified site: M19.90

## 2012-02-04 HISTORY — DX: Angina pectoris, unspecified: I20.9

## 2012-02-04 HISTORY — DX: Reserved for inherently not codable concepts without codable children: IMO0001

## 2012-02-04 HISTORY — DX: Depression, unspecified: F32.A

## 2012-02-04 HISTORY — DX: Anemia, unspecified: D64.9

## 2012-02-04 HISTORY — DX: Chronic obstructive pulmonary disease, unspecified: J44.9

## 2012-02-04 HISTORY — DX: Personal history of other diseases of the respiratory system: Z87.09

## 2012-02-04 LAB — APTT: aPTT: 45 seconds — ABNORMAL HIGH (ref 24–37)

## 2012-02-04 LAB — DIFFERENTIAL
Basophils Absolute: 0 10*3/uL (ref 0.0–0.1)
Basophils Relative: 0 % (ref 0–1)
Eosinophils Relative: 2 % (ref 0–5)
Lymphocytes Relative: 13 % (ref 12–46)
Monocytes Absolute: 0.3 10*3/uL (ref 0.1–1.0)
Neutro Abs: 3.7 10*3/uL (ref 1.7–7.7)

## 2012-02-04 LAB — CBC
HCT: 37.8 % (ref 36.0–46.0)
MCH: 30.3 pg (ref 26.0–34.0)
MCHC: 33.1 g/dL (ref 30.0–36.0)
MCHC: 33.1 g/dL (ref 30.0–36.0)
MCV: 93.1 fL (ref 78.0–100.0)
Platelets: 150 10*3/uL (ref 150–400)
Platelets: 164 10*3/uL (ref 150–400)
RDW: 13 % (ref 11.5–15.5)
RDW: 12.9 % (ref 11.5–15.5)
WBC: 4.7 10*3/uL (ref 4.0–10.5)

## 2012-02-04 LAB — PROTIME-INR: Prothrombin Time: 29.9 seconds — ABNORMAL HIGH (ref 11.6–15.2)

## 2012-02-04 LAB — POCT I-STAT, CHEM 8
Chloride: 103 mEq/L (ref 96–112)
HCT: 40 % (ref 36.0–46.0)
Hemoglobin: 13.6 g/dL (ref 12.0–15.0)
Potassium: 4 mEq/L (ref 3.5–5.1)
Sodium: 143 mEq/L (ref 135–145)

## 2012-02-04 LAB — COMPREHENSIVE METABOLIC PANEL
ALT: 17 U/L (ref 0–35)
AST: 35 U/L (ref 0–37)
Albumin: 3.6 g/dL (ref 3.5–5.2)
CO2: 29 mEq/L (ref 19–32)
Calcium: 10.5 mg/dL (ref 8.4–10.5)
Creatinine, Ser: 1.08 mg/dL (ref 0.50–1.10)
GFR calc non Af Amer: 45 mL/min — ABNORMAL LOW (ref >90)
Sodium: 140 mEq/L (ref 135–145)

## 2012-02-04 LAB — CK TOTAL AND CKMB (NOT AT ARMC)
CK, MB: 2.2 ng/mL (ref 0.3–4.0)
Relative Index: INVALID (ref 0.0–2.5)

## 2012-02-04 LAB — CREATININE, SERUM
Creatinine, Ser: 1.02 mg/dL (ref 0.50–1.10)
GFR calc non Af Amer: 48 mL/min — ABNORMAL LOW (ref >90)

## 2012-02-04 LAB — TROPONIN I: Troponin I: <0.3 ng/mL (ref <0.30)

## 2012-02-04 MED ORDER — PANTOPRAZOLE SODIUM 40 MG PO TBEC
40.0000 mg | DELAYED_RELEASE_TABLET | Freq: Every day | ORAL | Status: DC
  Administered 2012-02-07 – 2012-02-10 (×3): 40 mg via ORAL
  Filled 2012-02-04 (×4): qty 1

## 2012-02-04 MED ORDER — DONEPEZIL HCL 10 MG PO TABS
10.0000 mg | ORAL_TABLET | Freq: Every day | ORAL | Status: DC
  Filled 2012-02-04 (×3): qty 1

## 2012-02-04 MED ORDER — ALBUTEROL SULFATE (5 MG/ML) 0.5% IN NEBU: 2.5000 mg | INHALATION_SOLUTION | Freq: Four times a day (QID) | RESPIRATORY_TRACT | Status: DC | PRN

## 2012-02-04 MED ORDER — LOSARTAN POTASSIUM 50 MG PO TABS
50.0000 mg | ORAL_TABLET | Freq: Every day | ORAL | Status: DC
  Administered 2012-02-07 – 2012-02-10 (×4): 50 mg via ORAL
  Filled 2012-02-04 (×6): qty 1

## 2012-02-04 MED ORDER — SODIUM CHLORIDE 0.9 % IV SOLN: INTRAVENOUS | Status: AC

## 2012-02-04 MED ORDER — ACETAMINOPHEN 650 MG RE SUPP
325.0000 mg | RECTAL | Status: DC | PRN
  Administered 2012-02-04 – 2012-02-05 (×2): 325 mg via RECTAL
  Filled 2012-02-04 (×3): qty 1

## 2012-02-04 MED ORDER — WARFARIN - PHARMACIST DOSING INPATIENT: Freq: Every day | Status: DC

## 2012-02-04 MED ORDER — ARTIFICIAL TEARS OP OINT
1.0000 "application " | TOPICAL_OINTMENT | Freq: Every day | OPHTHALMIC | Status: DC
  Administered 2012-02-04 – 2012-02-09 (×6): 1 via OPHTHALMIC
  Filled 2012-02-04: qty 3.5

## 2012-02-04 MED ORDER — SENNA 8.6 MG PO TABS
2.0000 | ORAL_TABLET | Freq: Every day | ORAL | Status: DC
  Administered 2012-02-07 – 2012-02-09 (×3): 17.2 mg via ORAL
  Filled 2012-02-04 (×7): qty 2

## 2012-02-04 MED ORDER — ENOXAPARIN SODIUM 30 MG/0.3ML ~~LOC~~ SOLN
30.0000 mg | SUBCUTANEOUS | Status: DC
  Administered 2012-02-04: 30 mg via SUBCUTANEOUS
  Filled 2012-02-04: qty 0.3

## 2012-02-04 MED ORDER — SODIUM CHLORIDE 0.9 % IV SOLN
INTRAVENOUS | Status: DC
  Administered 2012-02-04: 22:00:00 via INTRAVENOUS

## 2012-02-04 MED ORDER — TIOTROPIUM BROMIDE MONOHYDRATE 18 MCG IN CAPS
18.0000 ug | ORAL_CAPSULE | Freq: Every day | RESPIRATORY_TRACT | Status: DC
  Administered 2012-02-05 – 2012-02-09 (×4): 18 ug via RESPIRATORY_TRACT
  Filled 2012-02-04: qty 5

## 2012-02-04 MED ORDER — BIOTENE DRY MOUTH MT LIQD
15.0000 mL | Freq: Two times a day (BID) | OROMUCOSAL | Status: DC
  Administered 2012-02-05 – 2012-02-10 (×6): 15 mL via OROMUCOSAL

## 2012-02-04 MED ORDER — TRAMADOL HCL 50 MG PO TABS: 25.0000 mg | ORAL_TABLET | Freq: Two times a day (BID) | ORAL | Status: DC | PRN

## 2012-02-04 MED ORDER — LEVOTHYROXINE SODIUM 75 MCG PO TABS
75.0000 ug | ORAL_TABLET | Freq: Every day | ORAL | Status: DC
  Administered 2012-02-07 – 2012-02-10 (×4): 75 ug via ORAL
  Filled 2012-02-04 (×8): qty 1

## 2012-02-04 MED ORDER — FUROSEMIDE 20 MG PO TABS
20.0000 mg | ORAL_TABLET | Freq: Two times a day (BID) | ORAL | Status: DC
  Administered 2012-02-07 – 2012-02-10 (×6): 20 mg via ORAL
  Filled 2012-02-04 (×13): qty 1

## 2012-02-04 MED ORDER — ACETAMINOPHEN 325 MG PO TABS
650.0000 mg | ORAL_TABLET | ORAL | Status: DC | PRN
  Administered 2012-02-07: 650 mg via ORAL
  Filled 2012-02-04: qty 2

## 2012-02-04 MED ORDER — IPRATROPIUM-ALBUTEROL 0.5-2.5 (3) MG/3ML IN SOLN: 3.0000 mL | Freq: Four times a day (QID) | RESPIRATORY_TRACT | Status: DC | PRN

## 2012-02-04 MED ORDER — LEVOTHYROXINE SODIUM 75 MCG PO TABS: 75.0000 ug | ORAL_TABLET | Freq: Every day | ORAL | Status: DC

## 2012-02-04 MED ORDER — MIRTAZAPINE 15 MG PO TABS
15.0000 mg | ORAL_TABLET | Freq: Every day | ORAL | Status: DC
  Filled 2012-02-04 (×3): qty 1

## 2012-02-04 MED ORDER — WARFARIN SODIUM 5 MG IV SOLR
5.0000 mg | INTRAVENOUS | Status: AC
  Administered 2012-02-04: 5 mg via INTRAVENOUS
  Filled 2012-02-04: qty 2.5

## 2012-02-04 MED ORDER — IPRATROPIUM BROMIDE 0.02 % IN SOLN: 0.5000 mg | Freq: Four times a day (QID) | RESPIRATORY_TRACT | Status: DC | PRN

## 2012-02-04 NOTE — ED Provider Notes (Signed)
History     CSN: [id number]  Arrival date & time 02/04/12  1322   First MD Initiated Contact with Patient 02/04/12 1342      Chief Complaint  Patient presents with  . Code Stroke    HPI To ED from skilled nuring facility via EMS, staff reported sudden onset confusion and left sided weakness, baseline mental status unclear, pt normally A/O per EMS, pt arrives confused and with left arm weakness, VSS, CBG 99, also c/o HA and abd pain on arrival, daughter states the patient appears to be back to her baseline  Past Medical History  Diagnosis Date  . Hypertension   . Hyperlipidemia   . Hypothyroidism   . Atrial fibrillation   . Pacemaker   . Shortness of breath   . CHF (congestive heart failure) 05/27/2006; 05/30/2006; 11/06/2008  . Vascular dementia     "due to CVA 06/08/2009"  . Stroke 03/06/2001; 05/30/2009    "light stroke"  . Stroke 06/08/2009    "probable small TIA"  . Stroke 01/06/2002  . Angina 12/22/2003  . Ventilator dependent 06/08/2009    respiratory failure  . H/O: rheumatic fever     "childhood"  . Coronary artery disease   . Left atrial enlargement   . H/O cardiomegaly     diastolic  . Pulmonary hypertension   . Anemia     "iron transfusions"  . Blood transfusion   . History of lower GI bleeding     "multiple; required blood transfusions"  . COPD (chronic obstructive pulmonary disease)   . GERD (gastroesophageal reflux disease)   . H/O hiatal hernia   . H/O chronic bronchitis   . Asthma   . Arthritis   . Anxiety   . Depression   . Dysphagia as late effect of stroke   . Autoimmune hepatitis 07/12/1999  . Bilateral renal cysts     "2 large on right off the lower pole"  . Chronic renal insufficiency   . Pleural effusion     "moderate right & small left"  . Bacterial pneumonia 11/06/2008; 10/23/2009; 03/30/2010  . Avascular necrosis of femoral head     right    Past Surgical History  Procedure Date  . Insert / replace / remove pacemaker 09/11/2000  . Av  node ablation "06/06/2005"  . Percutaneous tracheostomy 06/15/2009  . Electroencephalogram 01/28/2002; 11/26/2005; 10/23/2009  . Cardiac catheterization 06/14/1999; 12/26/2003; 05/30/2009  . Transcranial doppler & carotid ultrasound 11/28/2005  . Cardiac valve replacement 08/20/1999    "mitral valve replacement; St. Jude"  . Cardiac valve replacement 06/05/2009     "redo sternotomy & aortic valve replacement; tissue valve"  . Cauterized cecal av malformation   . Abdominal hysterectomy 1960's  . Dental implants removed 03/04/2001    lower  . Knee arthroscopy 12/26/2000    left  . Knee arthroscopy 07/29/2001    right    No family history on file.  History  Substance Use Topics  . Smoking status: Former Smoker -- 1.0 packs/day for 30 years    Types: Cigarettes    Quit date: 10/14/1970  . Smokeless tobacco: Never Used  . Alcohol Use: No    OB History    Grav Para Term Preterm Abortions TAB SAB Ect Mult Living                  Review of Systems  Unable to perform ROS: Other    Allergies  Advil; Relafen; Sulfa antibiotics; Toprol xl; and Xanax  Home  Medications  No current outpatient prescriptions on file.  BP 169/95  Pulse 72  Temp(Src) 97.8 F (36.6 C) (Oral)  Resp 18  SpO2 100%  Physical Exam  Nursing note and vitals reviewed. Constitutional: She is oriented to person, place, and time. She appears well-developed and well-nourished. No distress.  HENT:  Head: Normocephalic and atraumatic.  Eyes: Pupils are equal, round, and reactive to light.  Neck: Normal range of motion.  Cardiovascular: Normal rate and intact distal pulses.        Atrial flutter with complete AV block Ventricular premature complexes Nonspecific intraventricular conduction delay nonspecific ST-T changes No significant change when compared to tracing from May 2001   Pulmonary/Chest: No respiratory distress.  Abdominal: Normal appearance. She exhibits no distension.  Musculoskeletal: Normal range of  motion.  Neurological: She is alert and oriented to person, place, and time. No cranial nerve deficit. GCS eye subscore is 4. GCS verbal subscore is 5. GCS motor subscore is 6.       Patient has residual left-sided weakness which is at her baseline according to the daughter.  Skin: Skin is warm and dry. No rash noted.  Psychiatric: She has a normal mood and affect. Her behavior is normal.    ED Course  Procedures (including critical care time)  Labs Reviewed  PROTIME-INR - Abnormal; Notable for the following:    Prothrombin Time 29.9 (*)    INR 2.79 (*)    All other components within normal limits  APTT - Abnormal; Notable for the following:    aPTT 45 (*)    All other components within normal limits  DIFFERENTIAL - Abnormal; Notable for the following:    Neutrophils Relative 79 (*)    Lymphs Abs 0.6 (*)    All other components within normal limits  COMPREHENSIVE METABOLIC PANEL - Abnormal; Notable for the following:    Total Protein 9.0 (*)    GFR calc non Af Amer 45 (*)    GFR calc Af Amer 52 (*)    All other components within normal limits  POCT I-STAT, CHEM 8 - Abnormal; Notable for the following:    Creatinine, Ser 1.20 (*)    All other components within normal limits  CBC - Abnormal; Notable for the following:    Hemoglobin 11.8 (*)    HCT 35.7 (*)    All other components within normal limits  CBC  CK TOTAL AND CKMB  TROPONIN I  MRSA PCR SCREENING  GLUCOSE, POCT (MANUAL RESULT ENTRY)  HEMOGLOBIN A1C  LIPID PANEL  URINE RAPID DRUG SCREEN (HOSP PERFORMED)  CREATININE, SERUM  PROTIME-INR  GLUCOSE, POCT (MANUAL RESULT ENTRY)   Ct Head Wo Contrast  02/04/2012  *RADIOLOGY REPORT*  Clinical Data: Code stroke, altered mental status, left-sided weakness  CT HEAD WITHOUT CONTRAST  Technique:  Contiguous axial images were obtained from the base of the skull through the vertex without contrast.  Comparison: Head CT - 02/20/2010  Findings: Examination is minimally degraded  secondary to patient motion artifact.  Redemonstrated encephalomalacia involving the right parietal, frontal and temporal lobes, the sequela of remote right MCA distribution infarct. Unchanged left parietal infarction. Known cerebellar infarctions are suboptimally evaluated due to streak from the adjacent occipital cortex and patient motion artifact.  Grossly unchanged scattered hypodensities about the periventricular white matter bilaterally compatible with microvascular ischemic disease.  Given background parenchymal abnormalities, there is no definitive evidence of acute large territory infarct.  Unchanged size and configuration of the ventricles and basilar cisterns  with ex vacuo dilatation of the anterior horn of the right lateral ventricle.  No midline shift. The regional soft tissues are normal.  No definite calvarial fracture.  Paranasal sinuses are normal.  Post bilateral cataract surgery.  IMPRESSION: Stable sequela of remote ischemic change including large old right MCA infarction without definite superimposed acute intracranial process.  Original Report Authenticated By: Waynard Reeds, M.D.     1. TIA (transient ischemic attack)       MDM  Patient initially evaluated by the in-house neurologist.  Code stroke was deactivated        Nelia Shi, MD 02/04/12 2030

## 2012-02-04 NOTE — Progress Notes (Signed)
ANTICOAGULATION CONSULT NOTE - Initial Consult  Pharmacy Consult:  Coumadin Indication: atrial fibrillation  Allergies  Allergen Reactions  . Advil (Ibuprofen) Other (See Comments)    "think it just doesn't work for her"  . Relafen (Nabumetone) Other (See Comments)    "don't remember what happens"  . Sulfa Antibiotics Other (See Comments)    "don't remember what happens"  . Toprol Xl (Metoprolol Succinate) Other (See Comments)    25mg ; "don't remember what happens"  . Xanax Other (See Comments)    "don't remember what happens"     Vital Signs: Temp: 97.8 F (36.6 C) (04/23 1747) Temp src: Oral (04/23 1747) BP: 169/95 mmHg (04/23 1747) Pulse Rate: 72  (04/23 1747)  Labs:  Basename 02/04/12 1351 02/04/12 1331 02/04/12 1330  HGB 13.6 -- 12.5  HCT 40.0 -- 37.8  PLT -- -- 150  APTT -- -- 45*  LABPROT -- -- 29.9*  INR -- -- 2.79*  HEPARINUNFRC -- -- --  CREATININE 1.20* -- 1.08  CKTOTAL -- 87 --  CKMB -- 2.2 --  TROPONINI -- <0.30 --   CrCl is unknown because there is no height on file for the current visit.  Medical History: Past Medical History  Diagnosis Date  . Hypertension   . Hyperlipidemia   . Hypothyroidism   . Atrial fibrillation   . Pacemaker   . Shortness of breath   . CHF (congestive heart failure) 05/27/2006; 05/30/2006; 11/06/2008  . Vascular dementia     "due to CVA 06/08/2009"  . Stroke 03/06/2001; 05/30/2009    "light stroke"  . Stroke 06/08/2009    "probable small TIA"  . Stroke 01/06/2002  . Angina 12/22/2003  . Ventilator dependent 06/08/2009    respiratory failure  . H/O: rheumatic fever     "childhood"  . Coronary artery disease   . Left atrial enlargement   . H/O cardiomegaly     diastolic  . Pulmonary hypertension   . Anemia     "iron transfusions"  . Blood transfusion   . History of lower GI bleeding     "multiple; required blood transfusions"  . COPD (chronic obstructive pulmonary disease)   . GERD (gastroesophageal reflux  disease)   . H/O hiatal hernia   . H/O chronic bronchitis   . Asthma   . Arthritis   . Anxiety   . Depression   . Dysphagia as late effect of stroke   . Autoimmune hepatitis 07/12/1999  . Bilateral renal cysts     "2 large on right off the lower pole"  . Chronic renal insufficiency   . Pleural effusion     "moderate right & small left"  . Bacterial pneumonia 11/06/2008; 10/23/2009; 03/30/2010  . Avascular necrosis of femoral head     right      Assessment: 33  YOF admitted with Code Stroke and was not a candidate for tPA.  Pharmacy consulted to manage Coumadin for h/o AFib.  Per nursing home record, patient is on Coumadin 5mg  PO daily and her last dose was yesterday.  INR therapeutic at admission.  Per RN, patient unable to swallow pills d/t mental status.   Goal of Therapy:  INR 2 - 3    Plan:  - Continue Coumadin 5mg  IV today - Daily PT / INR    Atiyah Bauer D. Laney Potash, PharmD, BCPS Pager:  [phone] 02/04/2012, 7:25 PM

## 2012-02-04 NOTE — ED Notes (Signed)
Pt is on a puree diet and is bed bound at facility.

## 2012-02-04 NOTE — H&P (Signed)
History and Physical Examination  Date: 02/04/2012  Patient name: [name] Medical record number: [id number] Date of birth: 1922/12/23 Age: 78 y.o. Gender: female PCP: Lucilla Edin, MD, MD  Chief Complaint:  Chief Complaint  Patient presents with  . Code Stroke     History of Present Illness: [name] is an 78 y.o. female sent to the ER from skilled nuring facility via EMS.  The SNF staff reported sudden onset of confusion and left sided weakness, pt had recently been treated for a UTI 1 week ago.  Pt had a large prior stroke leaving her with vascular dementia and loss of function of the left side of body.   Her baseline mental status unclear, but her daughter is reporting that she does have dementia but  pt normally awake and responsive.  Pt arrived to ER confused and with left arm weakness (not sure if this is her baseline or new but daughter says it is baseline).  Pt also c/o HA and abd pain on arrival, daughter states the patient appears to be back to her baseline and daughter is very attentive and with the patient frequently.  A code stroke was called and pt was seen by neurology and was not deemed a candidate for TPA.  Neuro recommended an MRI and some other studies to work up and monitor her further.  Unfortunately pt can't get an MRI because of her having a pacemaker.  CT neg for acute.     Past Medical History Past Medical History  Diagnosis Date  . Hypertension   . Hyperlipidemia   . Hypothyroidism   . Atrial fibrillation   . Pacemaker   . Shortness of breath   . CHF (congestive heart failure) 05/27/2006; 05/30/2006; 11/06/2008  . Vascular dementia     "due to CVA 06/08/2009"  . Stroke 03/06/2001; 05/30/2009    "light stroke"  . Stroke 06/08/2009    "probable small TIA"  . Stroke 01/06/2002  . Angina 12/22/2003  . Ventilator dependent 06/08/2009    respiratory failure  . H/O: rheumatic fever     "childhood"  . Coronary artery disease   . Left atrial enlargement   . H/O  cardiomegaly     diastolic  . Pulmonary hypertension   . Anemia     "iron transfusions"  . Blood transfusion   . History of lower GI bleeding     "multiple; required blood transfusions"  . COPD (chronic obstructive pulmonary disease)   . GERD (gastroesophageal reflux disease)   . H/O hiatal hernia   . H/O chronic bronchitis   . Asthma   . Arthritis   . Anxiety   . Depression   . Dysphagia as late effect of stroke   . Autoimmune hepatitis 07/12/1999  . Bilateral renal cysts     "2 large on right off the lower pole"  . Chronic renal insufficiency   . Pleural effusion     "moderate right & small left"  . Bacterial pneumonia 11/06/2008; 10/23/2009; 03/30/2010  . Avascular necrosis of femoral head     right    Past Surgical History Past Surgical History  Procedure Date  . Insert / replace / remove pacemaker 09/11/2000  . Av node ablation "06/06/2005"  . Percutaneous tracheostomy 06/15/2009  . Electroencephalogram 01/28/2002; 11/26/2005; 10/23/2009  . Cardiac catheterization 06/14/1999; 12/26/2003; 05/30/2009  . Transcranial doppler & carotid ultrasound 11/28/2005  . Cardiac valve replacement 08/20/1999    "mitral valve replacement; [employer]"  . Cardiac  valve replacement 06/05/2009     "redo sternotomy & aortic valve replacement; tissue valve"  . Cauterized cecal av malformation   . Abdominal hysterectomy 1960's  . Dental implants removed 03/04/2001    lower  . Knee arthroscopy 12/26/2000    left  . Knee arthroscopy 07/29/2001    right    Home Meds: Prior to Admission medications   Medication Sig Start Date End Date Taking? Authorizing Provider  acetaminophen (TYLENOL) 325 MG tablet Take 650 mg by mouth 2 (two) times daily.   Yes Historical Provider, MD  artificial tears (LACRILUBE) OINT ophthalmic ointment Place 1 application into both eyes at bedtime.   Yes Historical Provider, MD  donepezil (ARICEPT) 10 MG tablet Take 10 mg by mouth at bedtime.   Yes Historical Provider, MD    furosemide (LASIX) 20 MG tablet Take 20 mg by mouth 2 (two) times daily.   Yes Historical Provider, MD  ipratropium-albuterol (DUONEB) 0.5-2.5 (3) MG/3ML SOLN Take 3 mLs by nebulization every 6 (six) hours as needed. For shortness of breath   Yes Historical Provider, MD  levothyroxine (SYNTHROID, LEVOTHROID) 75 MCG tablet Take 75 mcg by mouth daily.   Yes Historical Provider, MD  losartan (COZAAR) 50 MG tablet Take 50 mg by mouth daily.   Yes Historical Provider, MD  mirtazapine (REMERON) 15 MG tablet Take 15 mg by mouth at bedtime.   Yes Historical Provider, MD  omeprazole (PRILOSEC) 20 MG capsule Take 20 mg by mouth daily.   Yes Historical Provider, MD  Ethelda Chick (OYSTER CALCIUM) 500 MG TABS Take 500 mg of elemental calcium by mouth 3 (three) times daily.   Yes Historical Provider, MD  senna (SENOKOT) 8.6 MG TABS Take 2 tablets by mouth at bedtime.   Yes Historical Provider, MD  tiotropium (SPIRIVA) 18 MCG inhalation capsule Place 18 mcg into inhaler and inhale daily.   Yes Historical Provider, MD  traMADol (ULTRAM) 50 MG tablet Take 25 mg by mouth 2 (two) times daily as needed. For pain   Yes Historical Provider, MD  warfarin (COUMADIN) 5 MG tablet Take 5 mg by mouth daily.   Yes Historical Provider, MD    Allergies: Advil; Relafen; Sulfa antibiotics; Toprol xl; and Xanax  Social History:  History   Social History  . Marital Status: Widowed    Spouse Name: N/A    Number of Children: N/A  . Years of Education: N/A   Occupational History  . Not on file.   Social History Main Topics  . Smoking status: Former Smoker -- 1.0 packs/day for 30 years    Types: Cigarettes    Quit date: 10/14/1970  . Smokeless tobacco: Never Used  . Alcohol Use: No  . Drug Use: No  . Sexually Active: No   Other Topics Concern  . Not on file   Social History Narrative  . No narrative on file   Family History: No family history on file.  Review of Systems: Review of systems not obtained due  to patient factors.   Physical Exam: Blood pressure 169/95, pulse 72, temperature 97.8 F (36.6 C), temperature source Oral, resp. rate 18, SpO2 100.00%. General appearance: alert, cooperative, appears stated age and no distress Head: Normocephalic, without obvious abnormality, atraumatic Eyes: negative Nose: Nares normal. Septum midline. Mucosa normal. No drainage or sinus tenderness., no discharge Throat: edentulous, dry MM Neck: no adenopathy, no carotid bruit, no JVD, supple, symmetrical, trachea midline and thyroid not enlarged, symmetric, no tenderness/mass/nodules Lungs: clear to auscultation bilaterally  Heart: regularly irregular rhythm Abdomen: soft, non-tender; bowel sounds normal; no masses,  no organomegaly Extremities: no cyanosis Pulses: 2+ and symmetric Skin: Skin color, texture, turgor normal. No rashes or lesions Neurologic: pronounced left hemiparesis, left facial droop  Lab  And Imaging results:  Results for orders placed during the hospital encounter of 02/04/12 (from the past 24 hour(s))  PROTIME-INR     Status: Abnormal   Collection Time   02/04/12  1:30 PM      Component Value Range   Prothrombin Time 29.9 (*) 11.6 - 15.2 (seconds)   INR 2.79 (*) 0.00 - 1.49   APTT     Status: Abnormal   Collection Time   02/04/12  1:30 PM      Component Value Range   aPTT 45 (*) 24 - 37 (seconds)  CBC     Status: Normal   Collection Time   02/04/12  1:30 PM      Component Value Range   WBC 4.7  4.0 - 10.5 (K/uL)   RBC 4.06  3.87 - 5.11 (MIL/uL)   Hemoglobin 12.5  12.0 - 15.0 (g/dL)   HCT 81.1  91.4 - 78.2 (%)   MCV 93.1  78.0 - 100.0 (fL)   MCH 30.8  26.0 - 34.0 (pg)   MCHC 33.1  30.0 - 36.0 (g/dL)   RDW 95.6  21.3 - 08.6 (%)   Platelets 150  150 - 400 (K/uL)  DIFFERENTIAL     Status: Abnormal   Collection Time   02/04/12  1:30 PM      Component Value Range   Neutrophils Relative 79 (*) 43 - 77 (%)   Neutro Abs 3.7  1.7 - 7.7 (K/uL)   Lymphocytes Relative 13  12  - 46 (%)   Lymphs Abs 0.6 (*) 0.7 - 4.0 (K/uL)   Monocytes Relative 6  3 - 12 (%)   Monocytes Absolute 0.3  0.1 - 1.0 (K/uL)   Eosinophils Relative 2  0 - 5 (%)   Eosinophils Absolute 0.1  0.0 - 0.7 (K/uL)   Basophils Relative 0  0 - 1 (%)   Basophils Absolute 0.0  0.0 - 0.1 (K/uL)  COMPREHENSIVE METABOLIC PANEL     Status: Abnormal   Collection Time   02/04/12  1:30 PM      Component Value Range   Sodium 140  135 - 145 (mEq/L)   Potassium 3.8  3.5 - 5.1 (mEq/L)   Chloride 101  96 - 112 (mEq/L)   CO2 29  19 - 32 (mEq/L)   Glucose, Bld 87  70 - 99 (mg/dL)   BUN 15  6 - 23 (mg/dL)   Creatinine, Ser 5.78  0.50 - 1.10 (mg/dL)   Calcium 46.9  8.4 - 10.5 (mg/dL)   Total Protein 9.0 (*) 6.0 - 8.3 (g/dL)   Albumin 3.6  3.5 - 5.2 (g/dL)   AST 35  0 - 37 (U/L)   ALT 17  0 - 35 (U/L)   Alkaline Phosphatase 108  39 - 117 (U/L)   Total Bilirubin 0.4  0.3 - 1.2 (mg/dL)   GFR calc non Af Amer 45 (*) >90 (mL/min)   GFR calc Af Amer 52 (*) >90 (mL/min)  CK TOTAL AND CKMB     Status: Normal   Collection Time   02/04/12  1:31 PM      Component Value Range   Total CK 87  7 - 177 (U/L)   CK, MB 2.2  0.3 - 4.0 (ng/mL)   Relative Index RELATIVE INDEX IS INVALID  0.0 - 2.5   TROPONIN I     Status: Normal   Collection Time   02/04/12  1:31 PM      Component Value Range   Troponin I <0.30  <0.30 (ng/mL)  POCT I-STAT, CHEM 8     Status: Abnormal   Collection Time   02/04/12  1:51 PM      Component Value Range   Sodium 143  135 - 145 (mEq/L)   Potassium 4.0  3.5 - 5.1 (mEq/L)   Chloride 103  96 - 112 (mEq/L)   BUN 19  6 - 23 (mg/dL)   Creatinine, Ser 8.29 (*) 0.50 - 1.10 (mg/dL)   Glucose, Bld 89  70 - 99 (mg/dL)   Calcium, Ion 5.62  1.30 - 1.32 (mmol/L)   TCO2 33  0 - 100 (mmol/L)   Hemoglobin 13.6  12.0 - 15.0 (g/dL)   HCT 86.5  78.4 - 69.6 (%)     Impression   Depression  Anxiety  Arthritis  GERD (gastroesophageal reflux disease)  COPD (chronic obstructive pulmonary disease)   History of lower GI bleeding  Anemia  Pulmonary hypertension  H/O cardiomegaly  Left atrial enlargement  Coronary artery disease  Acute confusion  Vascular dementia  TIA (transient ischemic attack)  Dehydration  Dysphagia as late effect of cerebrovascular disease  Plan  Admit for monitoring (tele bed), Neurology saw patient and made some recommendations which will be carried out, check A1c, fasting lipid panel, PT/OT consult, SLP consult for swallow eval, No echo or carotids recommended because pt already anticoagulated and significantly disabled, continuing coumadin per pharmacy, frequent neuro checks, unfortunately pt has a Pacemaker and cannot get an MRI study, check EEG per neuro, await further recommendations neuro, daughter wants to be updated tomorrow on pt's condition and wants pt to be discharged back to SNF as soon as possible.   Standley Dakins MD [facility] [place], [place] [phone] 02/04/2012, 7:05 PM

## 2012-02-04 NOTE — ED Notes (Signed)
Stroke log  Encoded- 1315 Called 1315 Arrival 1317 EDP exam 1324 Stroke Team Arrival 1321 Last Seen Normal 1000 Pt arrival in CT 1326 Phlebotomist arrived 1323 Canceled at 1341 due to not being a stroke.

## 2012-02-04 NOTE — Consult Note (Addendum)
Referring Physician: Freida Busman     Chief Complaint: Difficulty with speech, left sided weakness  HPI: [name] is an 78 y.o. female who is bed bound at home.  Has had a previous stroke that left her with a left hemiparesis.  Patient today became acutely unable to speak.  Was brought in for evaluation at that time as a code stroke.    LSN: 1000 tPA Given: No: Outside time window  Past medical history: Stroke, afib, CHF, HTN, Hyperlipidemia, Anemia, Hepatitis, Hypothyroidism  No past surgical history on file.  No family history on file.  Social History:  does not have a smoking history on file. She does not have any smokeless tobacco history on file. Her alcohol and drug histories not on file.  Allergies: Allergies not on file  Medications: I have reviewed the patient's current medications. Prior to Admission:  Coumadin, Tramadol, Spiriva, Senokot, Calcium, Remeron, Cozaar, Synthroid, Lasix, Aricept, Lacrilube, Tylenol, Duoneb, Prilosec   ROS: Unable to obtain  Physical Examination: BP 164/90  HR 71  RR 17  Temp 97.4  O2 sat 99%  Neurologic Examination: Mental Status: Alert.Marland Kitchen  Speech fluent without evidence of aphasia but dysarthric (no teeth).  Able to follow simple commands without difficulty. Cranial Nerves: II: ? LHH with patient not blinking to left confrontation, pupils reactive to light and accommodation III,IV, VI: ptosis not present, extra-ocular motions intact bilaterally V,VII: mild left facial droop VIII: hearing normal bilaterally IX,X: gag reflex present XI: trapezius strength decreased on the left XII: tongue strength normal  Motor: Moves right spontaneously arm more than leg.  Unable to lift arm off the bed but movement seen.  No movement noted in LLE. Tone and bulk:increased tone on the left Sensory: Appreciates temperature throughout Deep Tendon Reflexes: 1+ in the upper extremities, absent in the lower extremities Plantars: Right: mute   Left:  mute Cerebellar: normal finger-to-nose on the right   Assessment: 77 y.o. female presenting with difficulty with speech.  Although initially left sided weakness was felt to be new, after speaking with the daughter it seems that this has been present since her initial stroke.  Unclear at this time whether patient has had a TIA or seizure related to her old stoke.  Further work up indicated.  CT of the head shows no acute changes.  Lab work reviewed and is significant for an INR of 2.79.  Stroke Risk Factors - atrial fibrillation, hyperlipidemia and hypertension  Plan: 1. HgbA1c, fasting lipid panel 2. MRI of the brain 3. PT consult, OT consult, Speech consult 4. Patient already on Coumadin and significantly disabled.  Would not perform carotid dopplers or echo at this time. 5. Prophylactic therapy-Continue Coumadin at current dosage-adequately anticoagulated 6. Frequent neuro checks 7. EEG      Thana Farr, MD [facility] [phone] 02/04/2012, 1:48 PM

## 2012-02-04 NOTE — ED Notes (Signed)
To ED from skilled nuring facility via EMS, staff reported sudden onset confusion and left sided weakness, baseline mental status unclear, pt normally A/O per EMS, pt arrives confused and with left arm weakness, VSS, CBG 99, also c/o HA and abd pain on arrival, old stroke noted on CT, 20g Left hand

## 2012-02-04 NOTE — ED Notes (Signed)
Pt has past stroke on left side 3 years ago. Pt has not been ambulatory since.

## 2012-02-04 NOTE — ED Notes (Signed)
Pt cleared to go to CT by Dr Freida Busman at bridge

## 2012-02-04 NOTE — ED Notes (Signed)
Pt's daughter states that she is more confused than normal.

## 2012-02-05 ENCOUNTER — Observation Stay (HOSPITAL_COMMUNITY): Payer: Medicare Other

## 2012-02-05 LAB — HEMOGLOBIN A1C
Hgb A1c MFr Bld: 5.3 % (ref <5.7)
Mean Plasma Glucose: 105 mg/dL (ref <117)

## 2012-02-05 LAB — PROTIME-INR
INR: 3.37 — ABNORMAL HIGH (ref 0.00–1.49)
Prothrombin Time: 34.6 seconds — ABNORMAL HIGH (ref 11.6–15.2)

## 2012-02-05 LAB — GLUCOSE, CAPILLARY
Glucose-Capillary: 118 mg/dL — ABNORMAL HIGH (ref 70–99)
Glucose-Capillary: 87 mg/dL (ref 70–99)

## 2012-02-05 LAB — LIPID PANEL
Cholesterol: 200 mg/dL (ref 0–200)
Total CHOL/HDL Ratio: 3.6 RATIO

## 2012-02-05 MED ORDER — HYDRALAZINE HCL 20 MG/ML IJ SOLN
5.0000 mg | Freq: Three times a day (TID) | INTRAMUSCULAR | Status: DC | PRN
  Filled 2012-02-05: qty 0.25

## 2012-02-05 MED ORDER — DEXTROSE-NACL 5-0.9 % IV SOLN
INTRAVENOUS | Status: DC
  Administered 2012-02-05: 01:00:00 via INTRAVENOUS

## 2012-02-05 MED ORDER — SIMVASTATIN 20 MG PO TABS
20.0000 mg | ORAL_TABLET | Freq: Every day | ORAL | Status: DC
  Administered 2012-02-07 – 2012-02-09 (×3): 20 mg via ORAL
  Filled 2012-02-05 (×6): qty 1

## 2012-02-05 MED ORDER — SODIUM CHLORIDE 0.9 % IV SOLN
1000.0000 mg | INTRAVENOUS | Status: AC
  Administered 2012-02-05: 1000 mg via INTRAVENOUS
  Filled 2012-02-05: qty 20

## 2012-02-05 MED ORDER — LORAZEPAM 2 MG/ML IJ SOLN
INTRAMUSCULAR | Status: AC
  Administered 2012-02-05: 1 mg via INTRAVENOUS
  Filled 2012-02-05: qty 1

## 2012-02-05 MED ORDER — DEXTROSE-NACL 5-0.9 % IV SOLN
INTRAVENOUS | Status: DC
  Administered 2012-02-06 – 2012-02-07 (×2): via INTRAVENOUS

## 2012-02-05 MED ORDER — LORAZEPAM 2 MG/ML IJ SOLN
1.0000 mg | Freq: Once | INTRAMUSCULAR | Status: AC
  Administered 2012-02-05: 1 mg via INTRAVENOUS

## 2012-02-05 NOTE — Progress Notes (Signed)
SLP Cancellation Note  Treatment cancelled today due to medical issues with patient which prohibited therapy.  Arrived at room to complete swallow evaluation. MD and RN in room secondary to patient with change in status. MD questioning seizures. Will hold evaluation and f/u 4/25. Ferdinand Lango MA, CCC-SLP [phone]   Devin Foskey Meryl 02/05/2012, 1:24 PM

## 2012-02-05 NOTE — Progress Notes (Signed)
Called to patients room several times today as second set of eyes to assess patients change in neuro status.  RN Lelon Mast reports coming into patients room to find her less responsive with drooling and some abnormal movement of lips and tongue.  Patients BP up one time - normal the next.  Patient seems somnolent - ? Post-ictal.  Admitted yesterday with AMS with suspicion of seizure activity.  Patient has been incontinent.  BP stable RR 18 regular.  On first visit patient was awake and alert at baseline on my arrival.  Second visit patient would open eyes - purposeful - few words but not following commands.  Chewing motion of mouth and tongue - has not bitten tongue.  Dr. Venetia Constable aware of events.  Daughter present at 23 - concerned - updates given.  Still waiting results of EEG from today.  Daughter states patient had UTI 2 weeks ago - unable to get urine sample due to incontinence - RN Lelon Mast to ask Dr. Venetia Constable for I/o cath for urine sample. Patient to CT scan- no changes.  Dr. Venetia Constable on floor at 1845 to see patient.

## 2012-02-05 NOTE — Procedures (Signed)
EEG NUMBER:  REFERRING PHYSICIAN:  Felipa Evener, MD  HISTORY:  An 78 year old female with a sudden onset of confusion and left-sided weakness.  MEDICATIONS:  Lasix, levothyroxine, Cozaar, Senokot, Remeron, warfarin, and Spiriva.  CONDITION OF RECORDINGS:  This is a 16-channel EEG carried out with the patient in the awake and asleep states.  DESCRIPTION:  The waking background activity is only noted briefly during the tracing.  Posterior background rhythm is poorly sustained, but does reached a maximum of 7 Hz theta activity seen from the parieto- occipital and posterotemporal regions.  Now, a mixture of theta and alpha rhythms from the central and temporal regions with some faster rhythms noted anteriorly.  The patient drowses with further slowing of the background rhythm and the patient goes into a light sleep with symmetrical sleep spindles.  The vertex was a sharp activity and irregular slow activity.  Hypoventilation was not performed. Intermittent photic stimulation failed to elicit any change in the tracing.  It should be noted that artifact was prominent during wakefulness in this tracing.  IMPRESSION:  This is an abnormal EEG secondary to posterior background slowing.  This finding can be seen with a diffuse gray matter disturbance that is etiologically nonspecific, but may include a dementia among other possibilities.          ______________________________ Thana Farr, MD    [id number] D:  02/05/2012 17:40:08  T:  02/05/2012 20:54:43  Job #:  [id number]

## 2012-02-05 NOTE — Progress Notes (Signed)
Utilization Review Completed.Kellen Hover T4/24/2013   

## 2012-02-05 NOTE — Progress Notes (Addendum)
[name] is a 78 y.o. female admitted with acute left sided weakness associated with inability to speak. There was suspicion for stroke. I have reviewed her chart, seen and examined her at bed side. Appreciate neuro input. Since patient can't get MRI due to pace maker, I have requested a follow up ct brain. [patient speaking coherently today, and says she is fine.   1. TIA (transient ischemic attack)     Past Medical History  Diagnosis Date  . Hypertension   . Hyperlipidemia   . Hypothyroidism   . Atrial fibrillation   . Pacemaker   . Shortness of breath   . CHF (congestive heart failure) 05/27/2006; 05/30/2006; 11/06/2008  . Vascular dementia     "due to CVA 06/08/2009"  . Stroke 03/06/2001; 05/30/2009    "light stroke"  . Stroke 06/08/2009    "probable small TIA"  . Stroke 01/06/2002  . Angina 12/22/2003  . Ventilator dependent 06/08/2009    respiratory failure  . H/O: rheumatic fever     "childhood"  . Coronary artery disease   . Left atrial enlargement   . H/O cardiomegaly     diastolic  . Pulmonary hypertension   . Anemia     "iron transfusions"  . Blood transfusion   . History of lower GI bleeding     "multiple; required blood transfusions"  . COPD (chronic obstructive pulmonary disease)   . GERD (gastroesophageal reflux disease)   . H/O hiatal hernia   . H/O chronic bronchitis   . Asthma   . Arthritis   . Anxiety   . Depression   . Dysphagia as late effect of stroke   . Autoimmune hepatitis 07/12/1999  . Bilateral renal cysts     "2 large on right off the lower pole"  . Chronic renal insufficiency   . Pleural effusion     "moderate right & small left"  . Bacterial pneumonia 11/06/2008; 10/23/2009; 03/30/2010  . Avascular necrosis of femoral head     right   Current Facility-Administered Medications  Medication Dose Route Frequency Provider Last Rate Last Dose  . 0.9 %  sodium chloride infusion   Intravenous STAT Lenon Oms Hunt, PA      . acetaminophen (TYLENOL)  suppository 325 mg  325 mg Rectal Q4H PRN Clanford Cyndie Mull, MD   325 mg at 02/04/12 2021  . acetaminophen (TYLENOL) tablet 650 mg  650 mg Oral Q4H PRN Clanford L Johnson, MD      . ipratropium (ATROVENT) nebulizer solution 0.5 mg  0.5 mg Nebulization Q6H PRN Clanford Cyndie Mull, MD       And  . albuterol (PROVENTIL) (5 MG/ML) 0.5% nebulizer solution 2.5 mg  2.5 mg Nebulization Q6H PRN Clanford L Johnson, MD      . antiseptic oral rinse (BIOTENE) solution 15 mL  15 mL Mouth Rinse BID Clanford L Johnson, MD   15 mL at 02/05/12 0900  . artificial tears (LACRILUBE) ophthalmic ointment 1 application  1 application Both Eyes QHS Clanford Cyndie Mull, MD   1 application at 02/04/12 2143  . donepezil (ARICEPT) tablet 10 mg  10 mg Oral QHS Clanford L Johnson, MD      . furosemide (LASIX) tablet 20 mg  20 mg Oral BID Clanford L Johnson, MD      . levothyroxine (SYNTHROID, LEVOTHROID) tablet 75 mcg  75 mcg Oral QAC breakfast Clanford L Johnson, MD      . losartan (COZAAR) tablet 50 mg  50 mg  Oral Daily Clanford Cyndie Mull, MD      . mirtazapine (REMERON) tablet 15 mg  15 mg Oral QHS Clanford L Johnson, MD      . pantoprazole (PROTONIX) EC tablet 40 mg  40 mg Oral Q1200 Clanford Cyndie Mull, MD      . senna (SENOKOT) tablet 17.2 mg  2 tablet Oral QHS Clanford L Johnson, MD      . tiotropium [facility]) inhalation capsule 18 mcg  18 mcg Inhalation Daily Clanford Cyndie Mull, MD   18 mcg at 02/05/12 0857  . traMADol (ULTRAM) tablet 25 mg  25 mg Oral BID PRN Clanford L Johnson, MD      . warfarin (COUMADIN) injection 5 mg  5 mg Intravenous NOW Thuy Dien Dang, PHARMD   5 mg at 02/04/12 2022  . Warfarin - Pharmacist Dosing Inpatient   Does not apply q1800 [facility], [place]      . DISCONTD: 0.9 %  sodium chloride infusion   Intravenous Continuous Clanford Cyndie Mull, MD      . DISCONTD: dextrose 5 %-0.9 % sodium chloride infusion   Intravenous Continuous Caroline More, NP 50 mL/hr at 02/05/12 0114    . DISCONTD:  enoxaparin (LOVENOX) injection 30 mg  30 mg Subcutaneous Q24H Clanford Cyndie Mull, MD   30 mg at 02/04/12 2020  . DISCONTD: ipratropium-albuterol (DUONEB) 0.5-2.5 (3) MG/3ML nebulizer solution 3 mL  3 mL Nebulization Q6H PRN Clanford Cyndie Mull, MD      . DISCONTD: levothyroxine (SYNTHROID, LEVOTHROID) tablet 75 mcg  75 mcg Oral Daily Clanford Cyndie Mull, MD       Allergies  Allergen Reactions  . Advil (Ibuprofen) Other (See Comments)    "think it just doesn't work for her"  . Relafen (Nabumetone) Other (See Comments)    "don't remember what happens"  . Sulfa Antibiotics Other (See Comments)    "don't remember what happens"  . Toprol Xl (Metoprolol Succinate) Other (See Comments)    25mg ; "don't remember what happens"  . Xanax Other (See Comments)    "don't remember what happens"   Active Problems:  Depression  Anxiety  Arthritis  GERD (gastroesophageal reflux disease)  COPD (chronic obstructive pulmonary disease)  History of lower GI bleeding  Anemia  Pulmonary hypertension  H/O cardiomegaly  Left atrial enlargement  Coronary artery disease  Acute confusion  Vascular dementia  TIA (transient ischemic attack)  Dehydration  Dysphagia as late effect of cerebrovascular disease   Vital signs in last 24 hours: Temp:  [97.4 F (36.3 C)-98.6 F (37 C)] 97.4 F (36.3 C) (04/24 1200) Pulse Rate:  [68-74] 70  (04/24 1200) Resp:  [17-18] 17  (04/24 1200) BP: (135-172)/(86-118) 161/90 mmHg (04/24 1200) SpO2:  [99 %-100 %] 99 % (04/24 1200) Weight:  [61.871 kg (136 lb 6.4 oz)] 61.871 kg (136 lb 6.4 oz) (04/24 0600) Weight change:  Last BM Date: 02/04/12  Intake/Output from previous day: 04/23 0701 - 04/24 0700 In: 238.3 [I.V.:238.3] Out: -  Intake/Output this shift: Total I/O In: 100 [I.V.:100] Out: -   Lab Results:  Basename 02/04/12 1930 02/04/12 1351 02/04/12 1330  WBC 5.1 -- 4.7  HGB 11.8* 13.6 --  HCT 35.7* 40.0 --  PLT 164 -- 150   BMET  Basename 02/04/12  1930 02/04/12 1351 02/04/12 1330  NA -- 143 140  K -- 4.0 3.8  CL -- 103 101  CO2 -- -- 29  GLUCOSE -- 89 87  BUN -- 19 15  CREATININE 1.02 1.20* --  CALCIUM -- -- 10.5    Studies/Results: Ct Head Wo Contrast  02/04/2012  *RADIOLOGY REPORT*  Clinical Data: Code stroke, altered mental status, left-sided weakness  CT HEAD WITHOUT CONTRAST  Technique:  Contiguous axial images were obtained from the base of the skull through the vertex without contrast.  Comparison: Head CT - 02/20/2010  Findings: Examination is minimally degraded secondary to patient motion artifact.  Redemonstrated encephalomalacia involving the right parietal, frontal and temporal lobes, the sequela of remote right MCA distribution infarct. Unchanged left parietal infarction. Known cerebellar infarctions are suboptimally evaluated due to streak from the adjacent occipital cortex and patient motion artifact.  Grossly unchanged scattered hypodensities about the periventricular white matter bilaterally compatible with microvascular ischemic disease.  Given background parenchymal abnormalities, there is no definitive evidence of acute large territory infarct.  Unchanged size and configuration of the ventricles and basilar cisterns with ex vacuo dilatation of the anterior horn of the right lateral ventricle.  No midline shift. The regional soft tissues are normal.  No definite calvarial fracture.  Paranasal sinuses are normal.  Post bilateral cataract surgery.  IMPRESSION: Stable sequela of remote ischemic change including large old right MCA infarction without definite superimposed acute intracranial process.  Original Report Authenticated By: Waynard Reeds, M.D.    Medications: I have reviewed the patient's current medications.   Physical exam GENERAL- alert HEAD- normal atraumatic, no neck masses, normal thyroid, no jvd RESPIRATORY- appears well, vitals normal, no respiratory distress, acyanotic, normal RR, ear and throat exam  is normal, neck free of mass or lymphadenopathy, chest clear, no wheezing, crepitations, rhonchi, normal symmetric air entry CVS- irregular pulse. ABDOMEN- abdomen is soft without significant tenderness, masses, organomegaly or guarding NEURO- left sided hemiplegia. EXTREMITIES- extremities normal, atraumatic, no cyanosis or edema  Plan  1. TIA versus acute CVA- apprecaite neuro. Allergy to nsaids, hence not on asa, on coumadin. Add statin. Follow speech therapy, ct brain. 2. HTn- not optimally controlled. Monitor off ivf. 3. Previous cva/dementia/hypothyroidism- continue current mx. 4. AFIB on coumadin- rate controlled. INR slightly supra therapeutic. Appreciate Pharmacy . [facility] inr 2-3. snf soon.    Shaneta Cervenka 02/05/2012 1:10 PM Pager: [phone].   P/S-Patient had seizure like episode, when she was out of it but eyes open, and right arm contracted. She was drooling from the mouth, suggesting a seizure. I gave her ativan 1mg  iv, and am planning to load her with dilantin, obtain cxr, check dilantin level in am. Follow neuro- eeg. Discussed with patient's daughter, Amaurie Schreckengost.

## 2012-02-05 NOTE — Progress Notes (Signed)
Walked into pts room and pt will not open eyes or follow commands; will mumble incoherent words and is drooling; pt will not respond to daughter; pt v-paced in the 70s, BP 122/93, 100% sats on 2.5L, rectal temp 100.3; Dr.Ranga paged and made aware, told to get pts head CT; will continue to monitor

## 2012-02-05 NOTE — Progress Notes (Signed)
A foley catheter was attempted twice, however, was unsuccessful. NP on call was made aware. New order received. Will cont to monitor pt.

## 2012-02-05 NOTE — Progress Notes (Addendum)
INITIAL ADULT NUTRITION ASSESSMENT Date: 02/05/2012   Time: 12:44 PM  Reason for Assessment: Nutrition Risk Report  ASSESSMENT: Female 78 y.o.  Dx: transient ischemic attack  Hx:  Past Medical History  Diagnosis Date  . Hypertension   . Hyperlipidemia   . Hypothyroidism   . Atrial fibrillation   . Pacemaker   . Shortness of breath   . CHF (congestive heart failure) 05/27/2006; 05/30/2006; 11/06/2008  . Vascular dementia     "due to CVA 06/08/2009"  . Stroke 03/06/2001; 05/30/2009    "light stroke"  . Stroke 06/08/2009    "probable small TIA"  . Stroke 01/06/2002  . Angina 12/22/2003  . Ventilator dependent 06/08/2009    respiratory failure  . H/O: rheumatic fever     "childhood"  . Coronary artery disease   . Left atrial enlargement   . H/O cardiomegaly     diastolic  . Pulmonary hypertension   . Anemia     "iron transfusions"  . Blood transfusion   . History of lower GI bleeding     "multiple; required blood transfusions"  . COPD (chronic obstructive pulmonary disease)   . GERD (gastroesophageal reflux disease)   . H/O hiatal hernia   . H/O chronic bronchitis   . Asthma   . Arthritis   . Anxiety   . Depression   . Dysphagia as late effect of stroke   . Autoimmune hepatitis 07/12/1999  . Bilateral renal cysts     "2 large on right off the lower pole"  . Chronic renal insufficiency   . Pleural effusion     "moderate right & small left"  . Bacterial pneumonia 11/06/2008; 10/23/2009; 03/30/2010  . Avascular necrosis of femoral head     right    Related Meds:     . sodium chloride   Intravenous STAT  . antiseptic oral rinse  15 mL Mouth Rinse BID  . artificial tears  1 application Both Eyes QHS  . donepezil  10 mg Oral QHS  . furosemide  20 mg Oral BID  . levothyroxine  75 mcg Oral QAC breakfast  . losartan  50 mg Oral Daily  . mirtazapine  15 mg Oral QHS  . pantoprazole  40 mg Oral Q1200  . senna  2 tablet Oral QHS  . tiotropium  18 mcg Inhalation Daily  .  warfarin  5 mg Intravenous NOW  . Warfarin - Pharmacist Dosing Inpatient   Does not apply q1800  . DISCONTD: enoxaparin  30 mg Subcutaneous Q24H  . DISCONTD: levothyroxine  75 mcg Oral Daily    Ht: 5\' 2"  (157.5 cm)  Wt: 136 lb 6.4 oz (61.871 kg)  Ideal Wt: 50 kg % Ideal Wt: 123%  Usual Wt: unable to obtain % Usual Wt: ---  Body mass index is 24.95 kg/(m^2).  Food/Nutrition Related Hx: problems chewing or swallowing foods and/or liquids per admission nutrition screen  Labs:  CMP     Component Value Date/Time   NA 143 02/04/2012 1351   K 4.0 02/04/2012 1351   CL 103 02/04/2012 1351   CO2 29 02/04/2012 1330   GLUCOSE 89 02/04/2012 1351   BUN 19 02/04/2012 1351   CREATININE 1.02 02/04/2012 1930   CALCIUM 10.5 02/04/2012 1330   PROT 9.0* 02/04/2012 1330   ALBUMIN 3.6 02/04/2012 1330   AST 35 02/04/2012 1330   ALT 17 02/04/2012 1330   ALKPHOS 108 02/04/2012 1330   BILITOT 0.4 02/04/2012 1330   GFRNONAA 48* 02/04/2012 1930  GFRAA 56* 02/04/2012 1930     Intake/Output Summary (Last 24 hours) at 02/05/12 1246 Last data filed at 02/05/12 0800  Gross per 24 hour  Intake 338.33 ml  Output      0 ml  Net 338.33 ml    CBG (last 3)   Basename 02/05/12 1207 02/05/12 0758 02/05/12 0423  GLUCAP 72 76 75    Diet Order: NPO  Supplements/Tube Feeding: N/A  IVF:    dextrose 5 % and 0.9% NaCl Last Rate: 50 mL/hr at 02/05/12 0114  DISCONTD: sodium chloride Last Rate: Stopped (02/05/12 0113)    Estimated Nutritional Needs:   Kcal: 1500-1700 Protein: 70-80 gm Fluid: 1.5-1.7 L  RD unable to obtain nutrition hx from pt -- resides at [facility]; admitted with sudden onset of confusion and left sided weakness; code stroke was called; per chart review, pt is on a pureed diet at SNF; no skin breakdown noted at this time; bedside swallow evaluation attempted per SLP -- treatment cancelled due to medical issues; remains NPO at this time  NUTRITION DIAGNOSIS: -Inadequate oral intake (NI-2.1).   Status: Ongoing  RELATED TO: inability to eat  AS EVIDENCE BY: NPO status  MONITORING/EVALUATION(Goals): Goal: Oral intake vs short-term nutrition support meet >90% of estimated nutrition needs Monitor: PO diet advancement, plan for nutrition support initiation, labs, weight, I/O's  EDUCATION NEEDS: -No education needs identified at this time  INTERVENTION:  Await SLP recommendations  If EN warranted, recommend initiation of Jevity 1.2 formula at 15 ml/hr and increase by 10 ml every 4 hours until goal rate of 55 ml/hr to provide 1584 kcals, 73 gm protein, 1065 ml of free water  RD to follow for nutrition care plan  Dietitian #: [phone]  DOCUMENTATION CODES Per approved criteria  -Not Applicable    Alger Memos 02/05/2012, 12:44 PM

## 2012-02-05 NOTE — Progress Notes (Signed)
In and out cath was performed and a urine sample was collected. Pt tolerated the in and out cath well. Will cont to monitor pt.

## 2012-02-05 NOTE — Progress Notes (Signed)
Clinical Social Work Department BRIEF PSYCHOSOCIAL ASSESSMENT 02/05/2012  Patient:  [name], [name]     Account Number:  [id number]     Admit date:  02/04/2012  Clinical Social Worker:  Dennison Bulla  Date/Time:  02/05/2012 04:00 PM  Referred by:  Physician  Date Referred:  02/05/2012 Referred for  SNF Placement   Other Referral:   Interview type:  Other - See comment Other interview type:   SNF    PSYCHOSOCIAL DATA Living Status:  FACILITY Admitted from facility:  [facility] Level of care:  Skilled Nursing Facility Primary support name:  Adilen Primary support relationship to patient:  CHILD, ADULT Degree of support available:   Unknown at this time    CURRENT CONCERNS Current Concerns  Post-Acute Placement   Other Concerns:    SOCIAL WORK ASSESSMENT / PLAN CSW received referral due to patient being admitted from SNF. CSW attempted to meet with patient but patient was confused and unable to participate in assessment. CSW attempted to call dtr but [phone] was an urgent care number and [phone] no one answered and unable to leave a message. CSW spoke with SNF who has the same numbers for family listed. SNF reported that patient can return at dc. CSW completed FL2 and will follow up with family at a later time.   Assessment/plan status:  Psychosocial Support/Ongoing Assessment of Needs Other assessment/ plan:   Information/referral to community resources:   Will return to SNF    PATIENT'S/FAMILY'S RESPONSE TO PLAN OF CARE: Patient unable to participate in assessment. Unable to leave a message with family.

## 2012-02-05 NOTE — Progress Notes (Signed)
   CARE MANAGEMENT NOTE 02/05/2012  Patient:  YEKATERINA, ESCUTIA   Account Number:  [id number]  Date Initiated:  02/05/2012  Documentation initiated by:  Onnie Boer  Subjective/Objective Assessment:   PT WAS ADMITTED WITH AMS     Action/Plan:   PROGRESSION OF CARE AND DISCHARGE PLANNING   Anticipated DC Date:  02/07/2012   Anticipated DC Plan:  SKILLED NURSING FACILITY  In-house referral  Clinical Social Worker      DC Planning Services  CM consult      Choice offered to / List presented to:             Status of service:  In process, will continue to follow Medicare Important Message given?   (If response is "NO", the following Medicare IM given date fields will be blank) Date Medicare IM given:   Date Additional Medicare IM given:    Discharge Disposition:    Per UR Regulation:  Reviewed for med. necessity/level of care/duration of stay  If discussed at Long Length of Stay Meetings, dates discussed:    Comments:  02/05/12 Onnie Boer, RN, BSN 1429 PT WAS ADMITTED WITH AMS FROM [facility].  PT HAS AMS AND IS NOT ABLE TO ANSWER MY QUESTIONS.

## 2012-02-05 NOTE — Discharge Instructions (Signed)
STROKE/TIA DISCHARGE INSTRUCTIONS SMOKING Cigarette smoking nearly doubles your risk of having a stroke & is the single most alterable risk factor  If you smoke or have smoked in the last 12 months, you are advised to quit smoking for your health.  Most of the excess cardiovascular risk related to smoking disappears within a year of stopping.  Ask you doctor about anti-smoking medications  [place] Quit Line: 1-800-QUIT NOW  Free Smoking Cessation Classes ([phone]  CHOLESTEROL Know your levels; limit fat & cholesterol in your diet  Lipid Panel  No results found for this basename: chol, trig, hdl, cholhdl, vldl, ldlcalc      Many patients benefit from treatment even if their cholesterol is at goal.  Goal: Total Cholesterol (CHOL) less than 160  Goal:  Triglycerides (TRIG) less than 150  Goal:  HDL greater than 40  Goal:  LDL (LDLCALC) less than 100   BLOOD PRESSURE American Stroke Association blood pressure target is less that 120/80 mm/Hg  Your discharge blood pressure is:  BP: 148/96 mmHg  Monitor your blood pressure  Limit your salt and alcohol intake  Many individuals will require more than one medication for high blood pressure  DIABETES (A1c is a blood sugar average for last 3 months) Goal HGBA1c is under 7% (HBGA1c is blood sugar average for last 3 months)  Diabetes: {STROKE DC DIABETES:22357}    Lab Results  Component Value Date   HGBA1C 5.3 02/04/2012     Your HGBA1c can be lowered with medications, healthy diet, and exercise.  Check your blood sugar as directed by your physician  Call your physician if you experience unexplained or low blood sugars.  PHYSICAL ACTIVITY/REHABILITATION Goal is 30 minutes at least 4 days per week    {STROKE DC ACTIVITY/REHAB:22359}  Activity decreases your risk of heart attack and stroke and makes your heart stronger.  It helps control your weight and blood pressure; helps you relax and can improve your mood.  Participate in a  regular exercise program.  Talk with your doctor about the best form of exercise for you (dancing, walking, swimming, cycling).  DIET/WEIGHT Goal is to maintain a healthy weight  Your discharge diet is: NPO *** liquids Your height is:    Your current weight is: Weight: 61.871 kg (136 lb 6.4 oz) Your Body Mass Index (BMI) is:     Following the type of diet specifically designed for you will help prevent another stroke.  Your goal weight range is:  ***  Your goal Body Mass Index (BMI) is 19-24.  Healthy food habits can help reduce 3 risk factors for stroke:  High cholesterol, hypertension, and excess weight.  RESOURCES Stroke/Support Group:  Call [phone]  they meet the 3rd Sunday of the month on the Rehab Unit at [facility], [place] ( no meetings June, July & Aug).  STROKE EDUCATION PROVIDED/REVIEWED AND GIVEN TO PATIENT Stroke warning signs and symptoms How to activate emergency medical system (call 911). Medications prescribed at discharge. Need for follow-up after discharge. Personal risk factors for stroke. Pneumonia vaccine given:   {STROKE DC YES/NO/DATE:22363} Flu vaccine given:   {STROKE DC YES/NO/DATE:22363} My questions have been answered, the writing is legible, and I understand these instructions.  I will adhere to these goals & educational materials that have been provided to me after my discharge from the hospital.

## 2012-02-05 NOTE — Progress Notes (Signed)
Walked into pts room, found unresponsive, drool coming out of pts mouth, apperas to be having a seizure; pts BP 192/125, pt placed on 3L [place] (sats in high 90s), V-paced in the 70s, CBG 118; Dr.Ranga still on the floor and was made aware; told to give 1mg  ativan, order for CXR received, repeat CT head, order for dilantin; pt unable to follow commands, or maintain salavia; can move upper extermities; will continue to monitor

## 2012-02-05 NOTE — Progress Notes (Signed)
OT Screen Order received, chart reviewed. Pt is bed bound and resides at SNF. Will sign off and defer OT eval back to that venue.  Garrel Ridgel, OTR/L  Pager [phone] 02/05/2012

## 2012-02-05 NOTE — Evaluation (Signed)
Physical Therapy Evaluation Patient Details Name: [name] MRN: [id number] DOB: 02-Feb-1923 Today's Date: 02/05/2012 Time: 1000-1015 PT Time Calculation (min): 15 min  PT Assessment / Plan / Recommendation Clinical Impression  [name] is an 78 y/o female who was non-ambulatory and dependent on lift equipment for transfers in SNF.  Pt is not a candidate for acute PT.      PT Assessment  Patent does not need any further PT services    Follow Up Recommendations  No PT follow up    Equipment Recommendations  None recommended by PT    Frequency      Precautions / Restrictions Precautions Precautions: Fall Restrictions Weight Bearing Restrictions: No    Pertinent Vitals/Pain C/o pain in left UE with PROM pt unable to rate pain.   BP163/91 HR 71 in supine.        Mobility  Bed Mobility Bed Mobility: Rolling Right;Rolling Left;Supine to Sit Rolling Right: 1: +2 Total assist Rolling Right: Patient Percentage: 10% Rolling Left: 1: +2 Total assist Rolling Left: Patient Percentage: 10% Supine to Sit: 1: +2 Total assist;HOB flat Supine to Sit: Patient Percentage: 10% Details for Bed Mobility Assistance: Total assist to roll to Right as pt has no use of L UE.  Unable to complete roll to left or supine to sit secondary to pt c/o pain.   Transfers Transfers: Not assessed Details for Transfer Assistance: Pt dependent on lift equipment at baseline.  Ambulation/Gait Ambulation/Gait Assistance: Not tested (comment) Stairs: No Wheelchair Mobility Wheelchair Mobility: No Modified Rankin (Stroke Patients Only) Modified Rankin: Severe disability    Exercises     PT Goals Acute Rehab PT Goals PT Goal Formulation: Patient unable to participate in goal setting  Visit Information  Last PT Received On: 02/05/12    Subjective Data  Subjective: Are you my second cousin Theron Arista?   Prior Functioning  Home Living Lives With: Other (Comment) (SNF) Available Help at Discharge:  Skilled Nursing Facility Type of Home: Skilled Nursing Facility Home Access: Level entry Home Layout: One level Bathroom Shower/Tub: Other (comment) (Unable to determine) Prior Function Level of Independence: Needs assistance Needs Assistance: Transfers Transfer Assistance: Total assist via lift equipment in SNF Able to Take Stairs?: No Driving: No Vocation: Retired Comments: RN reports pt was "bed bound" in SNF prior to admission.  Communication Communication: Expressive difficulties;Receptive difficulties Dominant Hand: Right    Cognition  Overall Cognitive Status: History of cognitive impairments - at baseline Area of Impairment: Attention;Memory;Following commands Arousal/Alertness: Awake/alert Orientation Level: Disoriented to;Place;Time;Situation Behavior During Session: Lethargic Current Attention Level: Focused    Extremity/Trunk Assessment Right Upper Extremity Assessment RUE ROM/Strength/Tone: [facility] for tasks assessed;Unable to fully assess RUE Coordination: Deficits Left Upper Extremity Assessment LUE ROM/Strength/Tone: Deficits LUE ROM/Strength/Tone Deficits: strength 1-/5 grossly, c/o wrist pain with PROM of shoulder and elbow.   LUE Sensation: Deficits Right Lower Extremity Assessment RLE ROM/Strength/Tone: Deficits RLE ROM/Strength/Tone Deficits: pt not acively moving R LE rigid and resistive to PROM Left Lower Extremity Assessment LLE ROM/Strength/Tone: Deficits LLE ROM/Strength/Tone Deficits: Same as R LE   Balance Balance Balance Assessed: No  End of Session PT - End of Session Activity Tolerance: Patient limited by pain Patient left: in bed;with call bell/phone within reach;with bed alarm set Nurse Communication: Mobility status;Need for lift equipment   Charmeka [place]burg 02/05/2012, 1:25 PM Zia Najera L. Allenmichael Mcpartlin DPT [phone]

## 2012-02-05 NOTE — Progress Notes (Signed)
ANTICOAGULATION CONSULT NOTE - Follow Up Consult  Pharmacy Consult for Coumadin Indication: atrial fibrillation, stroke  Allergies  Allergen Reactions  . Advil (Ibuprofen) Other (See Comments)    "think it just doesn't work for her"  . Relafen (Nabumetone) Other (See Comments)    "don't remember what happens"  . Sulfa Antibiotics Other (See Comments)    "don't remember what happens"  . Toprol Xl (Metoprolol Succinate) Other (See Comments)    25mg ; "don't remember what happens"  . Xanax Other (See Comments)    "don't remember what happens"    Patient Measurements: Height: 5\' 2"  (157.5 cm) Weight: 136 lb 6.4 oz (61.871 kg) IBW/kg (Calculated) : 50.1   Vital Signs: Temp: 97.4 F (36.3 C) (04/24 1200) Temp src: Axillary (04/24 0600) BP: 161/90 mmHg (04/24 1200) Pulse Rate: 70  (04/24 1200)  Labs:  Basename 02/05/12 0550 02/04/12 1930 02/04/12 1351 02/04/12 1331 02/04/12 1330  HGB -- 11.8* 13.6 -- --  HCT -- 35.7* 40.0 -- 37.8  PLT -- 164 -- -- 150  APTT -- -- -- -- 45*  LABPROT 34.6* -- -- -- 29.9*  INR 3.37* -- -- -- 2.79*  HEPARINUNFRC -- -- -- -- --  CREATININE -- 1.02 1.20* -- 1.08  CKTOTAL -- -- -- 87 --  CKMB -- -- -- 2.2 --  TROPONINI -- -- -- <0.30 --   Estimated Creatinine Clearance: 34.3 ml/min (by C-G formula based on Cr of 1.02).  Assessment:   INR is supratherapeutic today.   Coumadin 5 mg IV given last night.   NPO.  Goal of Therapy:    INR 2-3   Plan:    No Coumadin tonight.   Continue daily PT/INR.   Will follow-up for ok to give PO meds.   Dennie Fetters, [place] Pager: [phone] 02/05/2012,12:20 PM

## 2012-02-06 LAB — PHENYTOIN LEVEL, TOTAL: Phenytoin Lvl: 14.9 ug/mL (ref 10.0–20.0)

## 2012-02-06 LAB — GLUCOSE, CAPILLARY
Glucose-Capillary: 123 mg/dL — ABNORMAL HIGH (ref 70–99)
Glucose-Capillary: 128 mg/dL — ABNORMAL HIGH (ref 70–99)
Glucose-Capillary: 69 mg/dL — ABNORMAL LOW (ref 70–99)
Glucose-Capillary: 73 mg/dL (ref 70–99)
Glucose-Capillary: 91 mg/dL (ref 70–99)
Glucose-Capillary: 96 mg/dL (ref 70–99)

## 2012-02-06 LAB — PROTIME-INR
INR: 4.18 — ABNORMAL HIGH (ref 0.00–1.49)
Prothrombin Time: 41 seconds — ABNORMAL HIGH (ref 11.6–15.2)

## 2012-02-06 LAB — CBC
Hemoglobin: 11.7 g/dL — ABNORMAL LOW (ref 12.0–15.0)
MCV: 92.1 fL (ref 78.0–100.0)
Platelets: 153 10*3/uL (ref 150–400)
RBC: 3.9 MIL/uL (ref 3.87–5.11)
WBC: 4.8 10*3/uL (ref 4.0–10.5)

## 2012-02-06 LAB — URINALYSIS, ROUTINE W REFLEX MICROSCOPIC
Bilirubin Urine: NEGATIVE
Glucose, UA: NEGATIVE mg/dL
Nitrite: NEGATIVE
Specific Gravity, Urine: 1.017 (ref 1.005–1.030)
pH: 7 (ref 5.0–8.0)

## 2012-02-06 LAB — RAPID URINE DRUG SCREEN, HOSP PERFORMED
Amphetamines: NOT DETECTED
Barbiturates: NOT DETECTED
Benzodiazepines: NOT DETECTED
Cocaine: NOT DETECTED
Opiates: NOT DETECTED
Tetrahydrocannabinol: NOT DETECTED

## 2012-02-06 MED ORDER — ENALAPRILAT 1.25 MG/ML IV INJ
0.6250 mg | INJECTION | Freq: Once | INTRAVENOUS | Status: AC
Start: 2012-02-06 — End: 2012-02-06
  Administered 2012-02-06: 0.625 mg via INTRAVENOUS
  Filled 2012-02-06: qty 0.5

## 2012-02-06 MED ORDER — DEXTROSE 5 % IV SOLN
1.0000 g | INTRAVENOUS | Status: AC
  Administered 2012-02-06 – 2012-02-09 (×4): 1 g via INTRAVENOUS
  Filled 2012-02-06 (×4): qty 10

## 2012-02-06 MED ORDER — DEXTROSE 50 % IV SOLN
INTRAVENOUS | Status: AC
  Administered 2012-02-06: 25 mL
  Filled 2012-02-06: qty 50

## 2012-02-06 NOTE — Plan of Care (Signed)
Problem: Phase III Progression Outcomes Goal: Tolerates activity with minimal fatigue Outcome: Not Applicable Date Met:  02/06/12 Pt is bedbound. Pt is able to tolerate being turned in bed.

## 2012-02-06 NOTE — Progress Notes (Signed)
[facility] PROGRESS NOTE    SUBJECTIVE   Patient will respond to pain stating "ow" and withdraw all four extremities from pain.  She will not respond to verbal command but moves purposefully in bed.   OBJECTIVE   Vital signs in last 24 hours: Temp:  [97 F (36.1 C)-98.6 F (37 C)] 97 F (36.1 C) (04/25 0800) Pulse Rate:  [70-75] 70  (04/25 0800) Resp:  [17-18] 18  (04/25 0800) BP: (136-169)/(70-94) 163/80 mmHg (04/25 0800) SpO2:  [98 %-100 %] 99 % (04/25 0800)  Intake/Output from previous day: 04/24 0701 - 04/25 0700 In: 820 [I.V.:550; IV Piggyback:270] Out: -  Intake/Output this shift:   Nutritional status: NPO  Past Medical History  Diagnosis Date  . Hypertension   . Hyperlipidemia   . Hypothyroidism   . Atrial fibrillation   . Pacemaker   . Shortness of breath   . CHF (congestive heart failure) 05/27/2006; 05/30/2006; 11/06/2008  . Vascular dementia     "due to CVA 06/08/2009"  . Stroke 03/06/2001; 05/30/2009    "light stroke"  . Stroke 06/08/2009    "probable small TIA"  . Stroke 01/06/2002  . Angina 12/22/2003  . Ventilator dependent 06/08/2009    respiratory failure  . H/O: rheumatic fever     "childhood"  . Coronary artery disease   . Left atrial enlargement   . H/O cardiomegaly     diastolic  . Pulmonary hypertension   . Anemia     "iron transfusions"  . Blood transfusion   . History of lower GI bleeding     "multiple; required blood transfusions"  . COPD (chronic obstructive pulmonary disease)   . GERD (gastroesophageal reflux disease)   . H/O hiatal hernia   . H/O chronic bronchitis   . Asthma   . Arthritis   . Anxiety   . Depression   . Dysphagia as late effect of stroke   . Autoimmune hepatitis 07/12/1999  . Bilateral renal cysts     "2 large on right off the lower pole"  . Chronic renal insufficiency   . Pleural effusion     "moderate right & small left"  . Bacterial pneumonia 11/06/2008;  10/23/2009; 03/30/2010  . Avascular necrosis of femoral head     right    Neurologic Exam:   Mental Status: Responds to pain but stating "owe" and when I move her extremities she states "that hurts, stop it". She will not follow verbal commands or answer questions. Speech fluent without evidence of aphasia.  Cranial Nerves: II-Holds eyes tightly shut when trying to open--could not examin III/IV/VI-Holds eyes tightly shut when trying to open--could not examin V/VII-Smile symmetric with grimmace VIII-grossly intact IX/X-normal gag XI-bilateral shoulder shrug XII-unable to examin Motor: moves her right arm and leg spontaneously left arm moves slightly to pain and has increased tone. Left LE again shows increased tone and minimal movement.  Sensory: Pinprick and light touch intact throughout, bilaterally Deep Tendon Reflexes: 1+ and symmetric throughout UE, minimal LE and no AJ Plantars: mute  on right, mute on left  Lab Results: Lab Results  Component Value Date/Time   CHOL 200 02/05/2012  5:50 AM   Lipid Panel  Basename 02/05/12 0550  CHOL 200  TRIG 81  HDL 55  CHOLHDL  3.6  VLDL 16  LDLCALC 129*    Studies/Results: Ct Head Wo Contrast  02/05/2012  *RADIOLOGY REPORT*  Clinical Data: 78 year old female with recent infarct.  CT HEAD WITHOUT CONTRAST  Technique:  Contiguous axial images were obtained from the base of the skull through the vertex without contrast.  Comparison: 02/04/2012 and earlier.  Findings: Visualized paranasal sinuses and mastoids are clear. Stable visualized osseous structures.  No acute orbit or scalp soft tissue findings.  Mild Calcified atherosclerosis at the skull base.  Chronic right MCA infarct with encephalomalacia and ex vacuo changes to the ventricles. No midline shift, mass effect, or evidence of mass lesion.  No acute intracranial hemorrhage identified.  Chronic cerebellar infarcts are stable.  Left periventricular white matter hypodensity is stable. No  evidence of cortically based acute infarction identified.  No suspicious intracranial vascular hyperdensity.  IMPRESSION: Stable chronic ischemic disease. No acute intracranial abnormality.  Original Report Authenticated By: Harley Hallmark, M.D.   Ct Head Wo Contrast  02/04/2012  *RADIOLOGY REPORT*  Clinical Data: Code stroke, altered mental status, left-sided weakness  CT HEAD WITHOUT CONTRAST  Technique:  Contiguous axial images were obtained from the base of the skull through the vertex without contrast.  Comparison: Head CT - 02/20/2010  Findings: Examination is minimally degraded secondary to patient motion artifact.  Redemonstrated encephalomalacia involving the right parietal, frontal and temporal lobes, the sequela of remote right MCA distribution infarct. Unchanged left parietal infarction. Known cerebellar infarctions are suboptimally evaluated due to streak from the adjacent occipital cortex and patient motion artifact.  Grossly unchanged scattered hypodensities about the periventricular white matter bilaterally compatible with microvascular ischemic disease.  Given background parenchymal abnormalities, there is no definitive evidence of acute large territory infarct.  Unchanged size and configuration of the ventricles and basilar cisterns with ex vacuo dilatation of the anterior horn of the right lateral ventricle.  No midline shift. The regional soft tissues are normal.  No definite calvarial fracture.  Paranasal sinuses are normal.  Post bilateral cataract surgery.  IMPRESSION: Stable sequela of remote ischemic change including large old right MCA infarction without definite superimposed acute intracranial process.  Original Report Authenticated By: Waynard Reeds, M.D.   Dg Chest Port 1 View  02/05/2012  **ADDENDUM** CREATED: 02/05/2012 16:15:08  Repeat semi upright AP portable view 1555 hours provided with better positioning.  Stable cardiomegaly.  Upper lobe perihilar streaky opacity has not  significantly changed from prior exams and probably represent chronic atelectasis or scarring.  Conclusion:  Stable chest.  **END ADDENDUM** SIGNED BY: Harley Hallmark, M.D.    02/05/2012  *RADIOLOGY REPORT*  Clinical Data: 78 year old female Code stroke.  Confusion, weakness, recent UTI.  PORTABLE CHEST - 1 VIEW  Comparison: 02/20/2010 and earlier.  Findings: Portable semi upright AP view 1420 hours.  Limited due to severe patient kyphosis.  Stable low lung volumes. Stable cardiomegaly and mediastinal contours.  Stable right chest cardiac pacemaker.  Stable sequelae median sternotomy and aortic valve replacement.  No definite pneumothorax, pulmonary edema, pleural effusion or consolidation.  IMPRESSION: Limited due to severe kyphosis.  Stable low lung volumes, cardiomegaly, and postoperative changes.  Original Report Authenticated By: Harley Hallmark, M.D.    Medications:     Scheduled:   . sodium chloride   Intravenous STAT  . antiseptic oral rinse  15 mL Mouth Rinse BID  . artificial tears  1 application Both Eyes QHS  . cefTRIAXone (ROCEPHIN)  IV  1 g Intravenous Q24H  .  furosemide  20 mg Oral BID  . levothyroxine  75 mcg Oral QAC breakfast  . LORazepam  1 mg Intravenous Once  . losartan  50 mg Oral Daily  . pantoprazole  40 mg Oral Q1200  . phenytoin (DILANTIN) IV  1,000 mg Intravenous STAT  . senna  2 tablet Oral QHS  . simvastatin  20 mg Oral q1800  . tiotropium  18 mcg Inhalation Daily  . Warfarin - Pharmacist Dosing Inpatient   Does not apply q1800  . DISCONTD: donepezil  10 mg Oral QHS  . DISCONTD: mirtazapine  15 mg Oral QHS    Assessment/Plan:   78 YO female who presented to hospital with left sided weakness and speech difficulty--per daughter this is her baseline.  CT head 4/23 and 4/24 stable without new acute infarct. MRI unable due to pacemaker.  EEG shows posterior background slowing. UA shows UTI.   Recommend: 1) treat underlying UTI 2) continue PT/OT 3) lower LDL  <100 4) Monitor INR as it is supra therapeutic 4.18  Neurology will sign off at this time.    Felicie Morn PA-C [facility] [phone]  02/06/2012, 10:50 AM

## 2012-02-06 NOTE — Progress Notes (Signed)
SLP Cancellation Note  Treatment cancelled today due to medical issues with patient which prohibited therapy. Patient arouses briefly with max auditory and tactile cues, said "goodmorning", however unable to maintain appropriate level of alertness for swallow evaluation or po intake. Will continue to f/u.   [name] 02/06/2012, 10:06 AM

## 2012-02-06 NOTE — Progress Notes (Signed)
SUBJECTIVE "OK". Patient mostly somnolent but arousable.   1. TIA (transient ischemic attack)     Past Medical History  Diagnosis Date  . Hypertension   . Hyperlipidemia   . Hypothyroidism   . Atrial fibrillation   . Pacemaker   . Shortness of breath   . CHF (congestive heart failure) 05/27/2006; 05/30/2006; 11/06/2008  . Vascular dementia     "due to CVA 06/08/2009"  . Stroke 03/06/2001; 05/30/2009    "light stroke"  . Stroke 06/08/2009    "probable small TIA"  . Stroke 01/06/2002  . Angina 12/22/2003  . Ventilator dependent 06/08/2009    respiratory failure  . H/O: rheumatic fever     "childhood"  . Coronary artery disease   . Left atrial enlargement   . H/O cardiomegaly     diastolic  . Pulmonary hypertension   . Anemia     "iron transfusions"  . Blood transfusion   . History of lower GI bleeding     "multiple; required blood transfusions"  . COPD (chronic obstructive pulmonary disease)   . GERD (gastroesophageal reflux disease)   . H/O hiatal hernia   . H/O chronic bronchitis   . Asthma   . Arthritis   . Anxiety   . Depression   . Dysphagia as late effect of stroke   . Autoimmune hepatitis 07/12/1999  . Bilateral renal cysts     "2 large on right off the lower pole"  . Chronic renal insufficiency   . Pleural effusion     "moderate right & small left"  . Bacterial pneumonia 11/06/2008; 10/23/2009; 03/30/2010  . Avascular necrosis of femoral head     right   Current Facility-Administered Medications  Medication Dose Route Frequency Provider Last Rate Last Dose  . 0.9 %  sodium chloride infusion   Intravenous STAT Lenon Oms Hunt, PA      . acetaminophen (TYLENOL) suppository 325 mg  325 mg Rectal Q4H PRN Clanford Cyndie Mull, MD   325 mg at 02/05/12 2047  . acetaminophen (TYLENOL) tablet 650 mg  650 mg Oral Q4H PRN Clanford L Johnson, MD      . ipratropium (ATROVENT) nebulizer solution 0.5 mg  0.5 mg Nebulization Q6H PRN Clanford Cyndie Mull, MD       And  . albuterol  (PROVENTIL) (5 MG/ML) 0.5% nebulizer solution 2.5 mg  2.5 mg Nebulization Q6H PRN Clanford L Johnson, MD      . antiseptic oral rinse (BIOTENE) solution 15 mL  15 mL Mouth Rinse BID Clanford L Johnson, MD   15 mL at 02/05/12 0900  . artificial tears (LACRILUBE) ophthalmic ointment 1 application  1 application Both Eyes QHS Clanford Cyndie Mull, MD   1 application at 02/05/12 2138  . cefTRIAXone (ROCEPHIN) 1 g in dextrose 5 % 50 mL IVPB  1 g Intravenous Q24H Kelyse Pask, MD   1 g at 02/06/12 1011  . dextrose 5 %-0.9 % sodium chloride infusion   Intravenous Continuous Caroline More, NP 50 mL/hr at 02/06/12 0022    . furosemide (LASIX) tablet 20 mg  20 mg Oral BID Clanford Cyndie Mull, MD      . hydrALAZINE (APRESOLINE) injection 5 mg  5 mg Intravenous Q8H PRN Jina Olenick, MD      . levothyroxine (SYNTHROID, LEVOTHROID) tablet 75 mcg  75 mcg Oral QAC breakfast Clanford L Johnson, MD      . LORazepam (ATIVAN) injection 1 mg  1 mg Intravenous Once Conley Canal, MD  1 mg at 02/05/12 1346  . losartan (COZAAR) tablet 50 mg  50 mg Oral Daily Clanford L Johnson, MD      . pantoprazole (PROTONIX) EC tablet 40 mg  40 mg Oral Q1200 Clanford L Johnson, MD      . phenytoin (DILANTIN) 1,000 mg in sodium chloride 0.9 % 250 mL IVPB  1,000 mg Intravenous STAT Jamian Andujo, MD   1,000 mg at 02/05/12 1539  . senna (SENOKOT) tablet 17.2 mg  2 tablet Oral QHS Clanford L Johnson, MD      . simvastatin (ZOCOR) tablet 20 mg  20 mg Oral q1800 Zaylin Runco, MD      . tiotropium (SPIRIVA) inhalation capsule 18 mcg  18 mcg Inhalation Daily Clanford Cyndie Mull, MD   18 mcg at 02/06/12 0734  . Warfarin - Pharmacist Dosing Inpatient   Does not apply q1800 [facility] [place]      . DISCONTD: dextrose 5 %-0.9 % sodium chloride infusion   Intravenous Continuous Caroline More, NP 50 mL/hr at 02/05/12 0114    . DISCONTD: donepezil (ARICEPT) tablet 10 mg  10 mg Oral QHS Clanford L Johnson, MD      . DISCONTD: mirtazapine  (REMERON) tablet 15 mg  15 mg Oral QHS Clanford Cyndie Mull, MD      . DISCONTD: traMADol Janean Sark) tablet 25 mg  25 mg Oral BID PRN Clanford Cyndie Mull, MD       Allergies  Allergen Reactions  . Advil (Ibuprofen) Other (See Comments)    "think it just doesn't work for her"  . Relafen (Nabumetone) Other (See Comments)    "don't remember what happens"  . Sulfa Antibiotics Other (See Comments)    "don't remember what happens"  . Toprol Xl (Metoprolol Succinate) Other (See Comments)    25mg ; "don't remember what happens"  . Xanax Other (See Comments)    "don't remember what happens"   Active Problems:  Depression  Anxiety  Arthritis  GERD (gastroesophageal reflux disease)  COPD (chronic obstructive pulmonary disease)  History of lower GI bleeding  Anemia  Pulmonary hypertension  H/O cardiomegaly  Left atrial enlargement  Coronary artery disease  Acute confusion  Vascular dementia  TIA (transient ischemic attack)  Dehydration  Dysphagia as late effect of cerebrovascular disease   Vital signs in last 24 hours: Temp:  [97 F (36.1 C)-98.6 F (37 C)] 97 F (36.1 C) (04/25 0800) Pulse Rate:  [70-75] 70  (04/25 0800) Resp:  [17-18] 18  (04/25 0800) BP: (136-169)/(70-94) 163/80 mmHg (04/25 0800) SpO2:  [98 %-100 %] 99 % (04/25 0800) Weight change:  Last BM Date: 02/04/12  Intake/Output from previous day: 04/24 0701 - 04/25 0700 In: 820 [I.V.:550; IV Piggyback:270] Out: -  Intake/Output this shift:    Lab Results:  Basename 02/06/12 0523 02/04/12 1930  WBC 4.8 5.1  HGB 11.7* 11.8*  HCT 35.9* 35.7*  PLT 153 164   BMET  Basename 02/04/12 1930 02/04/12 1351 02/04/12 1330  NA -- 143 140  K -- 4.0 3.8  CL -- 103 101  CO2 -- -- 29  GLUCOSE -- 89 87  BUN -- 19 15  CREATININE 1.02 1.20* --  CALCIUM -- -- 10.5    Studies/Results: Ct Head Wo Contrast  02/05/2012  *RADIOLOGY REPORT*  Clinical Data: 78 year old female with recent infarct.  CT HEAD WITHOUT CONTRAST   Technique:  Contiguous axial images were obtained from the base of the skull through the vertex without contrast.  Comparison: 02/04/2012 and earlier.  Findings: Visualized paranasal sinuses and mastoids are clear. Stable visualized osseous structures.  No acute orbit or scalp soft tissue findings.  Mild Calcified atherosclerosis at the skull base.  Chronic right MCA infarct with encephalomalacia and ex vacuo changes to the ventricles. No midline shift, mass effect, or evidence of mass lesion.  No acute intracranial hemorrhage identified.  Chronic cerebellar infarcts are stable.  Left periventricular white matter hypodensity is stable. No evidence of cortically based acute infarction identified.  No suspicious intracranial vascular hyperdensity.  IMPRESSION: Stable chronic ischemic disease. No acute intracranial abnormality.  Original Report Authenticated By: Harley Hallmark, M.D.   Ct Head Wo Contrast  02/04/2012  *RADIOLOGY REPORT*  Clinical Data: Code stroke, altered mental status, left-sided weakness  CT HEAD WITHOUT CONTRAST  Technique:  Contiguous axial images were obtained from the base of the skull through the vertex without contrast.  Comparison: Head CT - 02/20/2010  Findings: Examination is minimally degraded secondary to patient motion artifact.  Redemonstrated encephalomalacia involving the right parietal, frontal and temporal lobes, the sequela of remote right MCA distribution infarct. Unchanged left parietal infarction. Known cerebellar infarctions are suboptimally evaluated due to streak from the adjacent occipital cortex and patient motion artifact.  Grossly unchanged scattered hypodensities about the periventricular white matter bilaterally compatible with microvascular ischemic disease.  Given background parenchymal abnormalities, there is no definitive evidence of acute large territory infarct.  Unchanged size and configuration of the ventricles and basilar cisterns with ex vacuo dilatation of  the anterior horn of the right lateral ventricle.  No midline shift. The regional soft tissues are normal.  No definite calvarial fracture.  Paranasal sinuses are normal.  Post bilateral cataract surgery.  IMPRESSION: Stable sequela of remote ischemic change including large old right MCA infarction without definite superimposed acute intracranial process.  Original Report Authenticated By: Waynard Reeds, M.D.   Dg Chest Port 1 View  02/05/2012  **ADDENDUM** CREATED: 02/05/2012 16:15:08  Repeat semi upright AP portable view 1555 hours provided with better positioning.  Stable cardiomegaly.  Upper lobe perihilar streaky opacity has not significantly changed from prior exams and probably represent chronic atelectasis or scarring.  Conclusion:  Stable chest.  **END ADDENDUM** SIGNED BY: Harley Hallmark, M.D.    02/05/2012  *RADIOLOGY REPORT*  Clinical Data: 78 year old female Code stroke.  Confusion, weakness, recent UTI.  PORTABLE CHEST - 1 VIEW  Comparison: 02/20/2010 and earlier.  Findings: Portable semi upright AP view 1420 hours.  Limited due to severe patient kyphosis.  Stable low lung volumes. Stable cardiomegaly and mediastinal contours.  Stable right chest cardiac pacemaker.  Stable sequelae median sternotomy and aortic valve replacement.  No definite pneumothorax, pulmonary edema, pleural effusion or consolidation.  IMPRESSION: Limited due to severe kyphosis.  Stable low lung volumes, cardiomegaly, and postoperative changes.  Original Report Authenticated By: Harley Hallmark, M.D.    Medications: I have reviewed the patient's current medications.   Physical exam GENERAL- alert HEAD- normal atraumatic, no neck masses, normal thyroid, no jvd RESPIRATORY- appears well, vitals normal, no respiratory distress, acyanotic, normal RR, ear and throat exam is normal, neck free of mass or lymphadenopathy, chest clear, no wheezing, crepitations, rhonchi, normal symmetric air entry CVS- regular rate and  rhythm, S1, S2 normal, no murmur, click, rub or gallop ABDOMEN- abdomen is soft without significant tenderness, masses, organomegaly or guarding NEURO- somnolent, not engaging in meaningful conversation. EXTREMITIES- extremities normal, atraumatic, no cyanosis or edema  Plan  1. TIA versus acute CVA versus toxic metabolic encephalopathy- reviewed eeg. Appreciate neur. Will hold off on anti seizure meds for now and treat uti. 2. UTI- start ceftriaxone. Follow urine culture. 3. HTn- Trending downwards. Not getting oral meds yet. Monitor off ivf. Give dose of analaprit today. 4. Previous cva/dementia/hypothyroidism- continue current mx.  5. AFIB on coumadin- rate controlled. INR slightly supra therapeutic. Appreciate Pharmacy . [facility] in[facility]-3.  snf soon.     Nyja Westbrook 02/06/2012 10:44 AM Pager: [phone].

## 2012-02-06 NOTE — Progress Notes (Signed)
ANTICOAGULATION CONSULT NOTE - Follow Up Consult  Pharmacy Consult for Coumadin Indication: atrial fibrillation, stroke  Allergies  Allergen Reactions  . Advil (Ibuprofen) Other (See Comments)    "think it just doesn't work for her"  . Relafen (Nabumetone) Other (See Comments)    "don't remember what happens"  . Sulfa Antibiotics Other (See Comments)    "don't remember what happens"  . Toprol Xl (Metoprolol Succinate) Other (See Comments)    25mg ; "don't remember what happens"  . Xanax Other (See Comments)    "don't remember what happens"    Patient Measurements: Height: 5\' 2"  (157.5 cm) Weight: 136 lb 6.4 oz (61.871 kg) IBW/kg (Calculated) : 50.1   Vital Signs: Temp: 98.1 F (36.7 C) (04/25 1157) Temp src: Axillary (04/25 0800) BP: 132/77 mmHg (04/25 1244) Pulse Rate: 68  (04/25 1244)  Labs:  Basename 02/06/12 0523 02/05/12 0550 02/04/12 1930 02/04/12 1351 02/04/12 1331 02/04/12 1330  HGB 11.7* -- 11.8* -- -- --  HCT 35.9* -- 35.7* 40.0 -- --  PLT 153 -- 164 -- -- 150  APTT -- -- -- -- -- 45*  LABPROT 41.0* 34.6* -- -- -- 29.9*  INR 4.18* 3.37* -- -- -- 2.79*  HEPARINUNFRC -- -- -- -- -- --  CREATININE -- -- 1.02 1.20* -- 1.08  CKTOTAL -- -- -- -- 87 --  CKMB -- -- -- -- 2.2 --  TROPONINI -- -- -- -- <0.30 --   Estimated Creatinine Clearance: 34.3 ml/min (by C-G formula based on Cr of 1.02).  Assessment:   INR remains supratherapeutic. Up from 4/24.   Coumadin 5 mg IV 5/23 pm.   NPO.    Seizure 4/24, Dilantin 1 gram IV load given. Level 14.9 today. No maintenance dose for now.      Day #1 Ceftriaxone for UTI coverage.    Currently not taking PO meds, no Synthroid for 2 days.  Goal of Therapy:    INR 2-3   Plan:    No Coumadin tonight.   Continue daily PT/INR.   Will follow-up for ok to give PO meds.   Coumadin IV when INR down.     Add Synthroid 37.5 mcg IV daily, while unable to take PO?  Dennie Fetters, [place] Pager: [phone] 02/06/2012,4:04  PM

## 2012-02-06 NOTE — Progress Notes (Signed)
Utilization review completed. Yacine Garriga RN, CCM  

## 2012-02-06 NOTE — Progress Notes (Signed)
Clinical Social Work  CSW went to [employer]. Patient was confused. CSW attempted to call [facility]CP[employer] (Carlissa-dtr). No answer on phone and unable to leave a message. CSW will continue to follow.  [place], [place] [phone]

## 2012-02-06 NOTE — Progress Notes (Signed)
Pt CBG 69. Pt still NPO due to pt lethargic and speech therapy not being able to assess. 1/2amp of D50 given. Will recheck CBG and continue to monitor patient closely. Ramond Craver, RN

## 2012-02-07 DIAGNOSIS — N390 Urinary tract infection, site not specified: Secondary | ICD-10-CM | POA: Diagnosis present

## 2012-02-07 LAB — GLUCOSE, CAPILLARY
Glucose-Capillary: 84 mg/dL (ref 70–99)
Glucose-Capillary: 103 mg/dL — ABNORMAL HIGH (ref 70–99)
Glucose-Capillary: 92 mg/dL (ref 70–99)

## 2012-02-07 MED ORDER — DEXTROSE 50 % IV SOLN: 1.0000 | Freq: Once | INTRAVENOUS | Status: DC

## 2012-02-07 MED ORDER — VITAMIN K1 10 MG/ML IJ SOLN
2.0000 mg | Freq: Once | INTRAMUSCULAR | Status: AC
  Administered 2012-02-07: 2 mg via SUBCUTANEOUS
  Filled 2012-02-07: qty 0.2

## 2012-02-07 NOTE — Progress Notes (Signed)
Physical Therapy Discharge  New order received. Please see PT evaluation of 02/05/12 by Alferd Apa, PT. Pt was bedbound and required use of a lift PTA at SNF. No skilled PT needs at this time. No evaluation warranted due to no change in status from 02/05/12. Will sign-off.   02/07/2012 Veda Canning, PT Pager: [phone]

## 2012-02-07 NOTE — Progress Notes (Signed)
CRITICAL VALUE ALERT  Critical value received:  PT 54.0 INR 5.95  Date of notification:  02/07/2012   Time of notification:  6:33 AM   Critical value read back:yes  Nurse who received alert:  Hively, Avie Echevaria, RN  MD notified (1st page):  Donnamarie Poag  Time of first page:  6:33 AM   MD notified (2nd page):  Time of second page:  Responding MD:    Time MD responded:

## 2012-02-07 NOTE — Progress Notes (Signed)
Clinical Social Work  Per MD, patient is not ready to dc. CSW met with patient and dtr in the room. Dtr reported she wanted patient to return to SNF [facility]). CSW informed SNF that patient is not ready to dc today. SNF agreeable to patient returning when ready and do accept weekend readmissions. CSW will continue to follow.  [place], [place] [phone]

## 2012-02-07 NOTE — Progress Notes (Signed)
[name] is a 78 y.o. female patient.  SUBJECTIVE "Iam doing alright'   1. TIA (transient ischemic attack)     Past Medical History  Diagnosis Date  . Hypertension   . Hyperlipidemia   . Hypothyroidism   . Atrial fibrillation   . Pacemaker   . Shortness of breath   . CHF (congestive heart failure) 05/27/2006; 05/30/2006; 11/06/2008  . Vascular dementia     "due to CVA 06/08/2009"  . Stroke 03/06/2001; 05/30/2009    "light stroke"  . Stroke 06/08/2009    "probable small TIA"  . Stroke 01/06/2002  . Angina 12/22/2003  . Ventilator dependent 06/08/2009    respiratory failure  . H/O: rheumatic fever     "childhood"  . Coronary artery disease   . Left atrial enlargement   . H/O cardiomegaly     diastolic  . Pulmonary hypertension   . Anemia     "iron transfusions"  . Blood transfusion   . History of lower GI bleeding     "multiple; required blood transfusions"  . COPD (chronic obstructive pulmonary disease)   . GERD (gastroesophageal reflux disease)   . H/O hiatal hernia   . H/O chronic bronchitis   . Asthma   . Arthritis   . Anxiety   . Depression   . Dysphagia as late effect of stroke   . Autoimmune hepatitis 07/12/1999  . Bilateral renal cysts     "2 large on right off the lower pole"  . Chronic renal insufficiency   . Pleural effusion     "moderate right & small left"  . Bacterial pneumonia 11/06/2008; 10/23/2009; 03/30/2010  . Avascular necrosis of femoral head     right   Current Facility-Administered Medications  Medication Dose Route Frequency Provider Last Rate Last Dose  . acetaminophen (TYLENOL) suppository 325 mg  325 mg Rectal Q4H PRN Clanford Cyndie Mull, MD   325 mg at 02/05/12 2047  . acetaminophen (TYLENOL) tablet 650 mg  650 mg Oral Q4H PRN Clanford L Johnson, MD      . ipratropium (ATROVENT) nebulizer solution 0.5 mg  0.5 mg Nebulization Q6H PRN Clanford Cyndie Mull, MD       And  . albuterol (PROVENTIL) (5 MG/ML) 0.5% nebulizer solution 2.5 mg  2.5 mg  Nebulization Q6H PRN Clanford L Johnson, MD      . antiseptic oral rinse (BIOTENE) solution 15 mL  15 mL Mouth Rinse BID Clanford L Johnson, MD   15 mL at 02/07/12 0800  . artificial tears (LACRILUBE) ophthalmic ointment 1 application  1 application Both Eyes QHS Clanford Cyndie Mull, MD   1 application at 02/06/12 2238  . cefTRIAXone (ROCEPHIN) 1 g in dextrose 5 % 50 mL IVPB  1 g Intravenous Q24H Markia Kyer, MD   1 g at 02/07/12 1008  . dextrose 5 %-0.9 % sodium chloride infusion   Intravenous Continuous Caroline More, NP 50 mL/hr at 02/06/12 0022    . dextrose 50 % solution 50 mL  1 ampule Intravenous Once Ronte Parker, MD      . dextrose 50 % solution        25 mL at 02/06/12 1220  . dextrose 50 % solution        25 mL at 02/06/12 1705  . enalaprilat (VASOTEC) injection 0.625 mg  0.625 mg Intravenous Once Jermel Artley, MD   0.625 mg at 02/06/12 1156  . furosemide (LASIX) tablet 20 mg  20 mg Oral BID Clanford  Cyndie Mull, MD      . hydrALAZINE (APRESOLINE) injection 5 mg  5 mg Intravenous Q8H PRN Paden Kuras, MD      . levothyroxine (SYNTHROID, LEVOTHROID) tablet 75 mcg  75 mcg Oral QAC breakfast Clanford Cyndie Mull, MD   75 mcg at 02/07/12 1008  . losartan (COZAAR) tablet 50 mg  50 mg Oral Daily Clanford Cyndie Mull, MD   50 mg at 02/07/12 1008  . pantoprazole (PROTONIX) EC tablet 40 mg  40 mg Oral Q1200 Clanford Cyndie Mull, MD   40 mg at 02/07/12 1008  . phytonadione (VITAMIN K) SQ injection 2 mg  2 mg Subcutaneous Once Caroline More, NP   2 mg at 02/07/12 0657  . senna (SENOKOT) tablet 17.2 mg  2 tablet Oral QHS Clanford L Johnson, MD      . simvastatin (ZOCOR) tablet 20 mg  20 mg Oral q1800 Zoa Dowty, MD      . tiotropium (SPIRIVA) inhalation capsule 18 mcg  18 mcg Inhalation Daily Clanford Cyndie Mull, MD   18 mcg at 02/06/12 0734  . Warfarin - Pharmacist Dosing Inpatient   Does not apply q1800 Lennon Alstrom, [facility]       Allergies  Allergen Reactions  . Advil (Ibuprofen) Other (See  Comments)    "think it just doesn't work for her"  . Relafen (Nabumetone) Other (See Comments)    "don't remember what happens"  . Sulfa Antibiotics Other (See Comments)    "don't remember what happens"  . Toprol Xl (Metoprolol Succinate) Other (See Comments)    25mg ; "don't remember what happens"  . Xanax Other (See Comments)    "don't remember what happens"   Active Problems:  Depression  Anxiety  Arthritis  GERD (gastroesophageal reflux disease)  COPD (chronic obstructive pulmonary disease)  History of lower GI bleeding  Anemia  Pulmonary hypertension  H/O cardiomegaly  Left atrial enlargement  Coronary artery disease  Acute confusion  Vascular dementia  TIA (transient ischemic attack)  Dehydration  Dysphagia as late effect of cerebrovascular disease   Vital signs in last 24 hours: Temp:  [97.3 F (36.3 C)-98.3 F (36.8 C)] 97.3 F (36.3 C) (04/26 0800) Pulse Rate:  [68-72] 70  (04/26 0800) Resp:  [16-18] 17  (04/26 0800) BP: (124-169)/(72-87) 146/72 mmHg (04/26 0800) SpO2:  [99 %-100 %] 100 % (04/26 0800) Weight change:  Last BM Date: 02/04/12  Intake/Output from previous day:   Intake/Output this shift:    Lab Results:  Basename 02/06/12 0523 02/04/12 1930  WBC 4.8 5.1  HGB 11.7* 11.8*  HCT 35.9* 35.7*  PLT 153 164   BMET  Basename 02/04/12 1930 02/04/12 1351 02/04/12 1330  NA -- 143 140  K -- 4.0 3.8  CL -- 103 101  CO2 -- -- 29  GLUCOSE -- 89 87  BUN -- 19 15  CREATININE 1.02 1.20* --  CALCIUM -- -- 10.5    Studies/Results: Ct Head Wo Contrast  02/05/2012  *RADIOLOGY REPORT*  Clinical Data: 78 year old female with recent infarct.  CT HEAD WITHOUT CONTRAST  Technique:  Contiguous axial images were obtained from the base of the skull through the vertex without contrast.  Comparison: 02/04/2012 and earlier.  Findings: Visualized paranasal sinuses and mastoids are clear. Stable visualized osseous structures.  No acute orbit or scalp soft tissue  findings.  Mild Calcified atherosclerosis at the skull base.  Chronic right MCA infarct with encephalomalacia and ex vacuo changes to the ventricles. No midline shift,  mass effect, or evidence of mass lesion.  No acute intracranial hemorrhage identified.  Chronic cerebellar infarcts are stable.  Left periventricular white matter hypodensity is stable. No evidence of cortically based acute infarction identified.  No suspicious intracranial vascular hyperdensity.  IMPRESSION: Stable chronic ischemic disease. No acute intracranial abnormality.  Original Report Authenticated By: Harley Hallmark, M.D.   Dg Chest Port 1 View  02/05/2012  **ADDENDUM** CREATED: 02/05/2012 16:15:08  Repeat semi upright AP portable view 1555 hours provided with better positioning.  Stable cardiomegaly.  Upper lobe perihilar streaky opacity has not significantly changed from prior exams and probably represent chronic atelectasis or scarring.  Conclusion:  Stable chest.  **END ADDENDUM** SIGNED BY: Harley Hallmark, M.D.    02/05/2012  *RADIOLOGY REPORT*  Clinical Data: 78 year old female Code stroke.  Confusion, weakness, recent UTI.  PORTABLE CHEST - 1 VIEW  Comparison: 02/20/2010 and earlier.  Findings: Portable semi upright AP view 1420 hours.  Limited due to severe patient kyphosis.  Stable low lung volumes. Stable cardiomegaly and mediastinal contours.  Stable right chest cardiac pacemaker.  Stable sequelae median sternotomy and aortic valve replacement.  No definite pneumothorax, pulmonary edema, pleural effusion or consolidation.  IMPRESSION: Limited due to severe kyphosis.  Stable low lung volumes, cardiomegaly, and postoperative changes.  Original Report Authenticated By: Harley Hallmark, M.D.    Medications: I have reviewed the patient's current medications.   Physical exam GENERAL- somnolent but arousable. HEAD- normal atraumatic, no neck masses, normal thyroid, no jvd RESPIRATORY- appears well, vitals normal, no  respiratory distress, acyanotic, normal RR, ear and throat exam is normal, neck free of mass or lymphadenopathy, chest clear, no wheezing, crepitations, rhonchi, normal symmetric air entry CVS- regular rate and rhythm, S1, S2 normal, no murmur, click, rub or gallop ABDOMEN- abdomen is soft without significant tenderness, masses, organomegaly or guarding NEURO- somnolent. EXTREMITIES- extremities normal, atraumatic, no cyanosis or edema  Plan  1. TIA versus acute CVA versus toxic metabolic encephalopathy- seems more of encephalopathy. Monitor. 2. UTI- continue ceftriaxone. Follow urine culture.  3. HTn- should resume oral BP meds today, and hopefully stabilize the BP. 4. Previous cva/dementia/hypothyroidism- continue current mx.  5. AFIB on coumadin- rate controlled. INR slightly supra therapeutic. Appreciate Pharmacy . [facility] in[facility] 2-3.  snf soon. Discussed plan of care with patient's daughter Emon.        Ajna Moors 02/07/2012 11:02 AM Pager: [phone].

## 2012-02-07 NOTE — Evaluation (Signed)
Clinical/Bedside Swallow Evaluation Patient Details  Name: [name] MRN: [id number] DOB: 01/11/1923 Today's Date: 02/07/2012  Past Medical History:  Past Medical History  Diagnosis Date  . Hypertension   . Hyperlipidemia   . Hypothyroidism   . Atrial fibrillation   . Pacemaker   . Shortness of breath   . CHF (congestive heart failure) 05/27/2006; 05/30/2006; 11/06/2008  . Vascular dementia     "due to CVA 06/08/2009"  . Stroke 03/06/2001; 05/30/2009    "light stroke"  . Stroke 06/08/2009    "probable small TIA"  . Stroke 01/06/2002  . Angina 12/22/2003  . Ventilator dependent 06/08/2009    respiratory failure  . H/O: rheumatic fever     "childhood"  . Coronary artery disease   . Left atrial enlargement   . H/O cardiomegaly     diastolic  . Pulmonary hypertension   . Anemia     "iron transfusions"  . Blood transfusion   . History of lower GI bleeding     "multiple; required blood transfusions"  . COPD (chronic obstructive pulmonary disease)   . GERD (gastroesophageal reflux disease)   . H/O hiatal hernia   . H/O chronic bronchitis   . Asthma   . Arthritis   . Anxiety   . Depression   . Dysphagia as late effect of stroke   . Autoimmune hepatitis 07/12/1999  . Bilateral renal cysts     "2 large on right off the lower pole"  . Chronic renal insufficiency   . Pleural effusion     "moderate right & small left"  . Bacterial pneumonia 11/06/2008; 10/23/2009; 03/30/2010  . Avascular necrosis of femoral head     right   Past Surgical History:  Past Surgical History  Procedure Date  . Insert / replace / remove pacemaker 09/11/2000  . Av node ablation "06/06/2005"  . Percutaneous tracheostomy 06/15/2009  . Electroencephalogram 01/28/2002; 11/26/2005; 10/23/2009  . Cardiac catheterization 06/14/1999; 12/26/2003; 05/30/2009  . Transcranial doppler & carotid ultrasound 11/28/2005  . Cardiac valve replacement 08/20/1999    "mitral valve replacement; [employer]"  . Cardiac valve replacement  06/05/2009     "redo sternotomy & aortic valve replacement; tissue valve"  . Cauterized cecal av malformation   . Abdominal hysterectomy 1960's  . Dental implants removed 03/04/2001    lower  . Knee arthroscopy 12/26/2000    left  . Knee arthroscopy 07/29/2001    right   HPI:  78 y.o. female admitted from SNF with change in MS; chronic left hemiparesis s/p 2010 CVA.  Dx UTI, head CT with no acute intracranial process.  Daughter reports hx dysphagia after CVA; eating pureed diet with thin liquids at SNF and was able to feed herself, though requiring supervision due to impulsitivy.   Assessment/Recommendations/Treatment Plan   Clinical Impression: Pt sufficiently arousable in order to participate in assessment; however, overall with lethargy.  Presents with adequate oral recognition of purees and thin liquids; decreased labial seal with hx of pocketing on left; swift swallow trigger with strong laryngeal elevation per palpation.  Pt appears to be protecting airway adequately - no s/s aspiration/penetration.  Rec resuming dysphagia 1 diet with thin liquids; feed only when alert; crush meds and give with puree.  Swallow function appears to be at baseline per daughter, despite decreased alertness.  No SLP f/u recommended.  Risk for Aspiration: Mild  Swallow Evaluation Recommendations Diet Recommendations: Dysphagia 1 (Puree);Thin liquid Liquid Administration via: Straw;Cup Medication Administration: Crushed with puree Supervision: Staff feed  patient Compensations: Small sips/bites;Slow rate;Check for pocketing Postural Changes and/or Swallow Maneuvers: Seated upright 90 degrees Oral Care Recommendations: Oral care BID Follow up Recommendations: None  Treatment Plan Treatment Plan Recommendations: No treatment recommended at this time     Individuals Consulted Consulted and Agree with Results and Recommendations: Family member/caregiver Family Member Consulted: daughter  Swallowing Goals  n/a   Dmari Schubring L. Samson Frederic, MA CCC/SLP Pager [phone]  [name] 02/07/2012,10:11 AM

## 2012-02-07 NOTE — Progress Notes (Addendum)
ANTICOAGULATION CONSULT NOTE - Follow Up Consult  Pharmacy Consult for Coumadin Indication: atrial fibrillation, stroke  Allergies  Allergen Reactions  . Advil (Ibuprofen) Other (See Comments)    "think it just doesn't work for her"  . Relafen (Nabumetone) Other (See Comments)    "don't remember what happens"  . Sulfa Antibiotics Other (See Comments)    "don't remember what happens"  . Toprol Xl (Metoprolol Succinate) Other (See Comments)    25mg ; "don't remember what happens"  . Xanax Other (See Comments)    "don't remember what happens"    Patient Measurements: Height: 5\' 2"  (157.5 cm) Weight: 136 lb 6.4 oz (61.871 kg) IBW/kg (Calculated) : 50.1   Vital Signs: Temp: 97.3 F (36.3 C) (04/26 0800) Temp src: Axillary (04/26 0800) BP: 146/72 mmHg (04/26 0800) Pulse Rate: 70  (04/26 0800)  Labs:  Basename 02/07/12 0530 02/06/12 0523 02/05/12 0550 02/04/12 1930 02/04/12 1351 02/04/12 1331 02/04/12 1330  HGB -- 11.7* -- 11.8* -- -- --  HCT -- 35.9* -- 35.7* 40.0 -- --  PLT -- 153 -- 164 -- -- 150  APTT -- -- -- -- -- -- 45*  LABPROT 54.0* 41.0* 34.6* -- -- -- --  INR 5.95* 4.18* 3.37* -- -- -- --  HEPARINUNFRC -- -- -- -- -- -- --  CREATININE -- -- -- 1.02 1.20* -- 1.08  CKTOTAL -- -- -- -- -- 87 --  CKMB -- -- -- -- -- 2.2 --  TROPONINI -- -- -- -- -- <0.30 --   Estimated Creatinine Clearance: 34.3 ml/min (by C-G formula based on Cr of 1.02).  Assessment:   INR remains supratherapeutic and continues to climb. INR = 5.95 this morning. Vitamin K 2 mg SQ given at 7am. Last coumadin dose was  5 mg IV 5/23 pm.  No bleeding reported.    Day #2 Ceftriaxone for UTI coverage.    Goal of Therapy:    INR 2-3   Plan:  Vitamin K 2 mg sq given at 7am per MD order   No Coumadin tonight.   Continue daily PT/INR.   Will follow-up for ok to give PO meds.   Coumadin IV when INR down.    Consider changing  Synthroid to  37.5 mcg IV daily, while unable to take PO. - she was able  to take po synthroid today.   Herby Abraham, Pharm.D. [phone] 02/07/2012 10:12 AM

## 2012-02-08 LAB — GLUCOSE, CAPILLARY
Glucose-Capillary: 79 mg/dL (ref 70–99)
Glucose-Capillary: 83 mg/dL (ref 70–99)

## 2012-02-08 LAB — PROTIME-INR
INR: 2.87 — ABNORMAL HIGH (ref 0.00–1.49)
Prothrombin Time: 30.5 seconds — ABNORMAL HIGH (ref 11.6–15.2)

## 2012-02-08 NOTE — Progress Notes (Addendum)
ANTICOAGULATION CONSULT NOTE - Follow Up Consult  Pharmacy Consult for Coumadin Indication: atrial fibrillation, stroke  Allergies  Allergen Reactions  . Advil (Ibuprofen) Other (See Comments)    "think it just doesn't work for her"  . Relafen (Nabumetone) Other (See Comments)    "don't remember what happens"  . Sulfa Antibiotics Other (See Comments)    "don't remember what happens"  . Toprol Xl (Metoprolol Succinate) Other (See Comments)    25mg ; "don't remember what happens"  . Xanax Other (See Comments)    "don't remember what happens"    Patient Measurements: Height: 5\' 2"  (157.5 cm) Weight: 136 lb 6.4 oz (61.871 kg) IBW/kg (Calculated) : 50.1   Vital Signs: Temp: 97.5 F (36.4 C) (04/27 0400) Temp src: Axillary (04/27 0400) BP: 144/71 mmHg (04/27 0400) Pulse Rate: 70  (04/27 0400)  Labs:  Basename 02/07/12 0530 02/06/12 0523  HGB -- 11.7*  HCT -- 35.9*  PLT -- 153  APTT -- --  LABPROT 54.0* 41.0*  INR 5.95* 4.18*  HEPARINUNFRC -- --  CREATININE -- --  CKTOTAL -- --  CKMB -- --  TROPONINI -- --   Estimated Creatinine Clearance: 34.3 ml/min (by C-G formula based on Cr of 1.02).  Assessment: No INR drawn today as patient refused blood draw.  Yesterday INR was 5.95 and she was given Vitamin K 2 mg SQ at 7am. Last coumadin dose was  5 mg IV 5/23 pm.  No bleeding reported.    Day #3 Ceftriaxone for UTI coverage.    Goal of Therapy:   INR 2-3   Plan:  No INR drawn today as she refused. No Coumadin tonight. Yesterday she was given SQ vitamin K. Continue daily PT/INR. Herby Abraham, Pharm.D. [phone] 02/08/2012 9:00 AM

## 2012-02-08 NOTE — Progress Notes (Addendum)
SUBJECTIVE "ok".   1. TIA (transient ischemic attack)     Past Medical History  Diagnosis Date  . Hypertension   . Hyperlipidemia   . Hypothyroidism   . Atrial fibrillation   . Pacemaker   . Shortness of breath   . CHF (congestive heart failure) 05/27/2006; 05/30/2006; 11/06/2008  . Vascular dementia     "due to CVA 06/08/2009"  . Stroke 03/06/2001; 05/30/2009    "light stroke"  . Stroke 06/08/2009    "probable small TIA"  . Stroke 01/06/2002  . Angina 12/22/2003  . Ventilator dependent 06/08/2009    respiratory failure  . H/O: rheumatic fever     "childhood"  . Coronary artery disease   . Left atrial enlargement   . H/O cardiomegaly     diastolic  . Pulmonary hypertension   . Anemia     "iron transfusions"  . Blood transfusion   . History of lower GI bleeding     "multiple; required blood transfusions"  . COPD (chronic obstructive pulmonary disease)   . GERD (gastroesophageal reflux disease)   . H/O hiatal hernia   . H/O chronic bronchitis   . Asthma   . Arthritis   . Anxiety   . Depression   . Dysphagia as late effect of stroke   . Autoimmune hepatitis 07/12/1999  . Bilateral renal cysts     "2 large on right off the lower pole"  . Chronic renal insufficiency   . Pleural effusion     "moderate right & small left"  . Bacterial pneumonia 11/06/2008; 10/23/2009; 03/30/2010  . Avascular necrosis of femoral head     right   Current Facility-Administered Medications  Medication Dose Route Frequency Provider Last Rate Last Dose  . acetaminophen (TYLENOL) suppository 325 mg  325 mg Rectal Q4H PRN Clanford Cyndie Mull, MD   325 mg at 02/05/12 2047  . acetaminophen (TYLENOL) tablet 650 mg  650 mg Oral Q4H PRN Clanford Cyndie Mull, MD   650 mg at 02/07/12 1220  . ipratropium (ATROVENT) nebulizer solution 0.5 mg  0.5 mg Nebulization Q6H PRN Clanford Cyndie Mull, MD       And  . albuterol (PROVENTIL) (5 MG/ML) 0.5% nebulizer solution 2.5 mg  2.5 mg Nebulization Q6H PRN Clanford L  Johnson, MD      . antiseptic oral rinse (BIOTENE) solution 15 mL  15 mL Mouth Rinse BID Clanford Cyndie Mull, MD   15 mL at 02/07/12 2306  . artificial tears (LACRILUBE) ophthalmic ointment 1 application  1 application Both Eyes QHS Clanford Cyndie Mull, MD   1 application at 02/07/12 2305  . cefTRIAXone (ROCEPHIN) 1 g in dextrose 5 % 50 mL IVPB  1 g Intravenous Q24H Dorleen Kissel, MD   1 g at 02/07/12 1008  . furosemide (LASIX) tablet 20 mg  20 mg Oral BID Clanford L Johnson, MD   20 mg at 02/07/12 1736  . hydrALAZINE (APRESOLINE) injection 5 mg  5 mg Intravenous Q8H PRN Jaiyana Canale, MD      . levothyroxine (SYNTHROID, LEVOTHROID) tablet 75 mcg  75 mcg Oral QAC breakfast Clanford Cyndie Mull, MD   75 mcg at 02/07/12 1008  . losartan (COZAAR) tablet 50 mg  50 mg Oral Daily Clanford Cyndie Mull, MD   50 mg at 02/07/12 1008  . pantoprazole (PROTONIX) EC tablet 40 mg  40 mg Oral Q1200 Clanford Cyndie Mull, MD   40 mg at 02/07/12 1008  . senna (SENOKOT) tablet 17.2 mg  2  tablet Oral QHS Clanford Cyndie Mull, MD   17.2 mg at 02/07/12 2304  . simvastatin (ZOCOR) tablet 20 mg  20 mg Oral q1800 Mayson Mcneish, MD   20 mg at 02/07/12 1736  . tiotropium (SPIRIVA) inhalation capsule 18 mcg  18 mcg Inhalation Daily Clanford Cyndie Mull, MD   18 mcg at 02/06/12 0734  . Warfarin - Pharmacist Dosing Inpatient   Does not apply q1800 [facility], [place]      . DISCONTD: dextrose 5 %-0.9 % sodium chloride infusion   Intravenous Continuous Caroline More, NP 50 mL/hr at 02/07/12 1526    . DISCONTD: dextrose 50 % solution 50 mL  1 ampule Intravenous Once Conley Canal, MD       Allergies  Allergen Reactions  . Advil (Ibuprofen) Other (See Comments)    "think it just doesn't work for her"  . Relafen (Nabumetone) Other (See Comments)    "don't remember what happens"  . Sulfa Antibiotics Other (See Comments)    "don't remember what happens"  . Toprol Xl (Metoprolol Succinate) Other (See Comments)    25mg ; "don't remember  what happens"  . Xanax Other (See Comments)    "don't remember what happens"   Active Problems:  Depression  Anxiety  Arthritis  GERD (gastroesophageal reflux disease)  COPD (chronic obstructive pulmonary disease)  History of lower GI bleeding  Anemia  Pulmonary hypertension  H/O cardiomegaly  Left atrial enlargement  Coronary artery disease  Acute confusion  Vascular dementia  TIA (transient ischemic attack)  Dehydration  Dysphagia as late effect of cerebrovascular disease  UTI (lower urinary tract infection)   Vital signs in last 24 hours: Temp:  [97.3 F (36.3 C)-98.8 F (37.1 C)] 97.5 F (36.4 C) (04/27 0400) Pulse Rate:  [70] 70  (04/27 0400) Resp:  [16-18] 18  (04/27 0400) BP: (130-165)/(68-87) 144/71 mmHg (04/27 0400) SpO2:  [100 %] 100 % (04/27 0400) Weight change:  Last BM Date: 02/04/12  Intake/Output from previous day:   Intake/Output this shift:    Lab Results:  Basename 02/06/12 0523  WBC 4.8  HGB 11.7*  HCT 35.9*  PLT 153   BMET No results found for this basename: NA:2,K:2,CL:2,CO2:2,GLUCOSE:2,BUN:2,CREATININE:2,CALCIUM:2 in the last 72 hours  Studies/Results: No results found.  Medications: I have reviewed the patient's current medications.   Physical exam GENERAL- alert HEAD- normal atraumatic, no neck masses, normal thyroid, no jvd RESPIRATORY- appears well, vitals normal, no respiratory distress, acyanotic, normal RR, ear and throat exam is normal, neck free of mass or lymphadenopathy, chest clear, no wheezing, crepitations, rhonchi, normal symmetric air entry CVS- regular rate and rhythm, S1, S2 normal, no murmur, click, rub or gallop ABDOMEN- abdomen is soft without significant tenderness, masses, organomegaly or guarding NEURO- more with it. EXTREMITIES- extremities normal, atraumatic, no cyanosis or edema  Plan  1. Toxic metabolic encephalopathy- due to uti. Resolved. Await UC. 2. UTI- continue ceftriaxone. Follow urine  culture.  3. HTn- Fluctuating control. Monitor. 4. Previous cva/dementia/hypothyroidism- continue current mx.  5. AFIB on coumadin- rate controlled. INR slightly supra therapeutic. Appreciate Pharmacy . Target inr 2-3.  snf soon.  Disposition- snf once UC available. No Need for telemetry.      Brytani Voth 02/08/2012 8:37 AM Pager: [phone].

## 2012-02-09 LAB — URINE CULTURE: Colony Count: >=100000

## 2012-02-09 LAB — PROTIME-INR
INR: 1.94 — ABNORMAL HIGH (ref 0.00–1.49)
Prothrombin Time: 22.5 seconds — ABNORMAL HIGH (ref 11.6–15.2)

## 2012-02-09 MED ORDER — WARFARIN SODIUM 5 MG PO TABS
5.0000 mg | ORAL_TABLET | Freq: Once | ORAL | Status: AC
  Administered 2012-02-09: 5 mg via ORAL
  Filled 2012-02-09: qty 1

## 2012-02-09 NOTE — Progress Notes (Signed)
ANTICOAGULATION CONSULT NOTE - Follow Up Consult  Pharmacy Consult for Coumadin Indication: atrial fibrillation, stroke  Allergies  Allergen Reactions  . Advil (Ibuprofen) Other (See Comments)    "think it just doesn't work for her"  . Relafen (Nabumetone) Other (See Comments)    "don't remember what happens"  . Sulfa Antibiotics Other (See Comments)    "don't remember what happens"  . Toprol Xl (Metoprolol Succinate) Other (See Comments)    25mg ; "don't remember what happens"  . Xanax Other (See Comments)    "don't remember what happens"    Patient Measurements: Height: 5\' 2"  (157.5 cm) Weight: 136 lb 6.4 oz (61.871 kg) IBW/kg (Calculated) : 50.1   Vital Signs: Temp: 98.7 F (37.1 C) (04/28 1000) Temp src: Axillary (04/28 1000) BP: 139/73 mmHg (04/28 1000) Pulse Rate: 78  (04/28 1000)  Labs:  Basename 02/09/12 0535 02/08/12 0915 02/07/12 0530  HGB -- -- --  HCT -- -- --  PLT -- -- --  APTT -- -- --  LABPROT 22.5* 30.5* 54.0*  INR 1.94* 2.87* 5.95*  HEPARINUNFRC -- -- --  CREATININE -- -- --  CKTOTAL -- -- --  CKMB -- -- --  TROPONINI -- -- --   Estimated Creatinine Clearance: 34.3 ml/min (by C-G formula based on Cr of 1.02).  Assessment: INR today 1.94 She was given Vitamin K 2 mg SQ at 7am 4/26.    Day #4Ceftriaxone for UTI coverage.    Goal of Therapy:   INR 2-3   Plan:  Coumadin 5 mg today. Continue daily PT/INR. Talbert Cage, Pharm.D. [phone] 02/09/2012 10:27 AM

## 2012-02-09 NOTE — Progress Notes (Addendum)
SUBJECTIVE Feeling all right.   1. TIA (transient ischemic attack)     Past Medical History  Diagnosis Date  . Hypertension   . Hyperlipidemia   . Hypothyroidism   . Atrial fibrillation   . Pacemaker   . Shortness of breath   . CHF (congestive heart failure) 05/27/2006; 05/30/2006; 11/06/2008  . Vascular dementia     "due to CVA 06/08/2009"  . Stroke 03/06/2001; 05/30/2009    "light stroke"  . Stroke 06/08/2009    "probable small TIA"  . Stroke 01/06/2002  . Angina 12/22/2003  . Ventilator dependent 06/08/2009    respiratory failure  . H/O: rheumatic fever     "childhood"  . Coronary artery disease   . Left atrial enlargement   . H/O cardiomegaly     diastolic  . Pulmonary hypertension   . Anemia     "iron transfusions"  . Blood transfusion   . History of lower GI bleeding     "multiple; required blood transfusions"  . COPD (chronic obstructive pulmonary disease)   . GERD (gastroesophageal reflux disease)   . H/O hiatal hernia   . H/O chronic bronchitis   . Asthma   . Arthritis   . Anxiety   . Depression   . Dysphagia as late effect of stroke   . Autoimmune hepatitis 07/12/1999  . Bilateral renal cysts     "2 large on right off the lower pole"  . Chronic renal insufficiency   . Pleural effusion     "moderate right & small left"  . Bacterial pneumonia 11/06/2008; 10/23/2009; 03/30/2010  . Avascular necrosis of femoral head     right   Current Facility-Administered Medications  Medication Dose Route Frequency Provider Last Rate Last Dose  . acetaminophen (TYLENOL) suppository 325 mg  325 mg Rectal Q4H PRN Clanford Cyndie Mull, MD   325 mg at 02/05/12 2047  . acetaminophen (TYLENOL) tablet 650 mg  650 mg Oral Q4H PRN Clanford Cyndie Mull, MD   650 mg at 02/07/12 1220  . ipratropium (ATROVENT) nebulizer solution 0.5 mg  0.5 mg Nebulization Q6H PRN Clanford Cyndie Mull, MD       And  . albuterol (PROVENTIL) (5 MG/ML) 0.5% nebulizer solution 2.5 mg  2.5 mg Nebulization Q6H PRN  Clanford L Johnson, MD      . antiseptic oral rinse (BIOTENE) solution 15 mL  15 mL Mouth Rinse BID Clanford Cyndie Mull, MD   15 mL at 02/09/12 0806  . artificial tears (LACRILUBE) ophthalmic ointment 1 application  1 application Both Eyes QHS Clanford Cyndie Mull, MD   1 application at 02/08/12 2249  . cefTRIAXone (ROCEPHIN) 1 g in dextrose 5 % 50 mL IVPB  1 g Intravenous Q24H Imojean Yoshino, MD   1 g at 02/08/12 1045  . furosemide (LASIX) tablet 20 mg  20 mg Oral BID Clanford Cyndie Mull, MD   20 mg at 02/09/12 0804  . hydrALAZINE (APRESOLINE) injection 5 mg  5 mg Intravenous Q8H PRN Analiza Cowger, MD      . levothyroxine (SYNTHROID, LEVOTHROID) tablet 75 mcg  75 mcg Oral QAC breakfast Clanford Cyndie Mull, MD   75 mcg at 02/09/12 0804  . losartan (COZAAR) tablet 50 mg  50 mg Oral Daily Clanford Cyndie Mull, MD   50 mg at 02/08/12 1037  . pantoprazole (PROTONIX) EC tablet 40 mg  40 mg Oral Q1200 Clanford Cyndie Mull, MD   40 mg at 02/08/12 1045  . senna (SENOKOT) tablet 17.2 mg  2 tablet Oral QHS Clanford Cyndie Mull, MD   17.2 mg at 02/08/12 2249  . simvastatin (ZOCOR) tablet 20 mg  20 mg Oral q1800 Summit Arroyave, MD   20 mg at 02/08/12 1739  . tiotropium (SPIRIVA) inhalation capsule 18 mcg  18 mcg Inhalation Daily Clanford Cyndie Mull, MD   18 mcg at 02/09/12 0859  . Warfarin - Pharmacist Dosing Inpatient   Does not apply q1800 Lennon Alstrom, [facility]       Allergies  Allergen Reactions  . Advil (Ibuprofen) Other (See Comments)    "think it just doesn't work for her"  . Relafen (Nabumetone) Other (See Comments)    "don't remember what happens"  . Sulfa Antibiotics Other (See Comments)    "don't remember what happens"  . Toprol Xl (Metoprolol Succinate) Other (See Comments)    25mg ; "don't remember what happens"  . Xanax Other (See Comments)    "don't remember what happens"   Active Problems:  Depression  Anxiety  Arthritis  GERD (gastroesophageal reflux disease)  COPD (chronic obstructive  pulmonary disease)  History of lower GI bleeding  Anemia  Pulmonary hypertension  H/O cardiomegaly  Left atrial enlargement  Coronary artery disease  Acute confusion  Vascular dementia  TIA (transient ischemic attack)  Dehydration  Dysphagia as late effect of cerebrovascular disease  UTI (lower urinary tract infection)   Vital signs in last 24 hours: Temp:  [97.9 F (36.6 C)-98.7 F (37.1 C)] 98.2 F (36.8 C) (04/28 0600) Pulse Rate:  [68-72] 68  (04/28 0600) Resp:  [14-18] 18  (04/28 0600) BP: (111-158)/(76-98) 130/82 mmHg (04/28 0600) SpO2:  [99 %-100 %] 99 % (04/28 0900) Weight change:  Last BM Date: 02/07/12  Intake/Output from previous day: 04/27 0701 - 04/28 0700 In: 320 [P.O.:120; IV Piggyback:50] Out: -  Intake/Output this shift:    Lab Results: No results found for this basename: WBC:2,HGB:2,HCT:2,PLT:2 in the last 72 hours BMET No results found for this basename: NA:2,K:2,CL:2,CO2:2,GLUCOSE:2,BUN:2,CREATININE:2,CALCIUM:2 in the last 72 hours  Studies/Results: No results found.  Medications: I have reviewed the patient's current medications.   Physical exam GENERAL- rather sleepy but wakes up to answer questions. HEAD- normal atraumatic, no neck masses, normal thyroid, no jvd RESPIRATORY- appears well, vitals normal, no respiratory distress, acyanotic, normal RR, ear and throat exam is normal, neck free of mass or lymphadenopathy, chest clear, no wheezing, crepitations, rhonchi, normal symmetric air entry CVS- regular rate and rhythm, S1, S2 normal, no murmur, click, rub or gallop ABDOMEN- abdomen is soft without significant tenderness, masses, organomegaly or guarding NEURO- no acute changes. EXTREMITIES- extremities normal, atraumatic, no cyanosis or edema  Plan  1. Toxic metabolic encephalopathy- due to uti. Resolved. Await UC results.  2. UTI- continue ceftriaxone to complete 7 days. Await urine culture.  3. HTn- Fluctuating control. Monitor.  4.  Previous cva/dementia/hypothyroidism- continue current mx.  5. AFIB on coumadin- rate controlled. INR 1.94. Appreciate Pharmacy . Target inr 2-3.  snf soon.  Disposition- snf once UC available, likely tomorrow.        Marlinda Miranda 02/09/2012 9:08 AM Pager: [phone].

## 2012-02-10 LAB — PROTIME-INR
INR: 1.38 (ref 0.00–1.49)
Prothrombin Time: 17.2 seconds — ABNORMAL HIGH (ref 11.6–15.2)

## 2012-02-10 MED ORDER — AMOXICILLIN-POT CLAVULANATE 500-125 MG PO TABS
1.0000 | ORAL_TABLET | Freq: Two times a day (BID) | ORAL | Status: DC
  Administered 2012-02-10: 500 mg via ORAL
  Filled 2012-02-10 (×2): qty 1

## 2012-02-10 MED ORDER — AMOXICILLIN-POT CLAVULANATE 500-125 MG PO TABS: 1.0000 | ORAL_TABLET | Freq: Two times a day (BID) | ORAL | Status: AC

## 2012-02-10 NOTE — Discharge Summary (Signed)
DISCHARGE SUMMARY  [name]  MR#: [id number]  DOB:Aug 29, 1923  Date of Admission: 02/04/2012 Date of Discharge: 02/10/2012  Attending Physician:Parnell Spieler  Patient's [name], Savannah Head, MD, MD  Consults:  Bess Harvest  Discharge Diagnoses: Present on Admission:  .Acute toxic metabolic encephalopathy due to UTI. Marland KitchenVascular dementia .TIA (transient ischemic attack) .Pulmonary hypertension .Left atrial enlargement .GERD (gastroesophageal reflux disease) .Coronary artery disease .COPD (chronic obstructive pulmonary disease) .Arthritis .Anxiety .Anemia .Dehydration .Depression .UTI (lower urinary tract infection)  Hospital Course: [name] was admitted on 02/04/12 as a possible code stroke. Subsequent brain ct(x2), showed no evidence of a new CVA. An EEG also showed nonspecific changes suggesting a metabolic encephalopathy. UA showed UTI. UC cultures grew corynebacterium, likely by colonization. She received 4 days of ceftriaxone and will need 4 more days of augmentin.  Her INR 1.38 today as she received dose of vit k after INr went up to 6, with no evidence of bleeding. She will need INR checked in the next 2-3 days for target INR 2-3. It seems [name] had encephalopathy due to UTI. She has been more somnolent but wakes up to eat and respond to to questions. i have avoided medications like Remeron, which more accentuate the somnolence. I discussed plan of care with [name]. Patient discharged in relatively stable condition.   Medication List  As of 02/10/2012  9:40 AM   STOP taking these medications         mirtazapine 15 MG tablet         TAKE these medications         acetaminophen 325 MG tablet   Commonly known as: TYLENOL   Take 650 mg by mouth 2 (two) times daily.      amoxicillin-clavulanate 500-125 MG per tablet   Commonly known as: AUGMENTIN   Take 1 tablet (500 mg total) by mouth 2 (two) times daily.      artificial tears Oint  ophthalmic ointment   Place 1 application into both eyes at bedtime.      donepezil 10 MG tablet   Commonly known as: ARICEPT   Take 10 mg by mouth at bedtime.      furosemide 20 MG tablet   Commonly known as: LASIX   Take 20 mg by mouth 2 (two) times daily.      ipratropium-albuterol 0.5-2.5 (3) MG/3ML Soln   Commonly known as: DUONEB   Take 3 mLs by nebulization every 6 (six) hours as needed. For shortness of breath      levothyroxine 75 MCG tablet   Commonly known as: SYNTHROID, LEVOTHROID   Take 75 mcg by mouth daily.      losartan 50 MG tablet   Commonly known as: COZAAR   Take 50 mg by mouth daily.      omeprazole 20 MG capsule   Commonly known as: PRILOSEC   Take 20 mg by mouth daily.      oyster calcium 500 MG Tabs   Take 500 mg of elemental calcium by mouth 3 (three) times daily.      senna 8.6 MG Tabs   Commonly known as: SENOKOT   Take 2 tablets by mouth at bedtime.      tiotropium 18 MCG inhalation capsule   Commonly known as: SPIRIVA   Place 18 mcg into inhaler and inhale daily.      traMADol 50 MG tablet   Commonly known as: ULTRAM   Take 25 mg by mouth 2 (two) times  daily as needed. For pain      warfarin 5 MG tablet   Commonly known as: COUMADIN   Take 5 mg by mouth daily.             Day of Discharge BP 129/84  Pulse 70  Temp(Src) 98 F (36.7 C) (Axillary)  Resp 18  Ht 5\' 2"  (1.575 m)  Wt 61.871 kg (136 lb 6.4 oz)  BMI 24.95 kg/m2  SpO2 95%  Physical Exam: Somnolent but arousable.  Results for orders placed during the hospital encounter of 02/04/12 (from the past 24 hour(s))  PROTIME-INR     Status: Abnormal   Collection Time   02/10/12  6:13 AM      Component Value Range   Prothrombin Time 17.2 (*) 11.6 - 15.2 (seconds)   INR 1.38  0.00 - 1.49     Disposition: to snf today.   Follow-up Appts: Discharge Orders    Future Orders Please Complete By Expires   Diet - low sodium heart healthy      Increase activity slowly             Tests Needing Follow-up: INR in 2-3 days.  Time spent in discharge (includes decision making & examination of pt): 35 minutes  Signed: Aqil Goetting 02/10/2012, 9:40 AM

## 2012-02-10 NOTE — Progress Notes (Signed)
Pt d/c in stable condition. Pt unable to receive instructions. All info given to and sent with patient to facility.

## 2012-02-10 NOTE — Progress Notes (Signed)
Clinical Social Work  CSW faxed dc summary and medications to SNF [facility]). SNF agreeable to readmission at 12:30. CSW informed patient, patient's dtr and RN of dc and all were agreeable. CSW coordinated transportation via [place]. CSW is signing off.  [place], [place] [phone]

## 2012-06-08 ENCOUNTER — Emergency Department (HOSPITAL_COMMUNITY): Payer: Medicare Other

## 2012-06-08 ENCOUNTER — Encounter (HOSPITAL_COMMUNITY): Payer: Self-pay

## 2012-06-08 ENCOUNTER — Observation Stay (HOSPITAL_COMMUNITY)
Admission: EM | Admit: 2012-06-08 | Discharge: 2012-06-09 | Disposition: A | Discharge status: Home / Self Care | Payer: Medicare Other | Attending: Family Medicine | Admitting: Family Medicine

## 2012-06-08 DIAGNOSIS — I672 Cerebral atherosclerosis: Secondary | ICD-10-CM | POA: Insufficient documentation

## 2012-06-08 DIAGNOSIS — R404 Transient alteration of awareness: Secondary | ICD-10-CM

## 2012-06-08 DIAGNOSIS — I2789 Other specified pulmonary heart diseases: Secondary | ICD-10-CM | POA: Insufficient documentation

## 2012-06-08 DIAGNOSIS — R4182 Altered mental status, unspecified: Principal | ICD-10-CM | POA: Insufficient documentation

## 2012-06-08 DIAGNOSIS — R1310 Dysphagia, unspecified: Secondary | ICD-10-CM | POA: Insufficient documentation

## 2012-06-08 DIAGNOSIS — J4489 Other specified chronic obstructive pulmonary disease: Secondary | ICD-10-CM | POA: Insufficient documentation

## 2012-06-08 DIAGNOSIS — I272 Pulmonary hypertension, unspecified: Secondary | ICD-10-CM | POA: Diagnosis present

## 2012-06-08 DIAGNOSIS — J449 Chronic obstructive pulmonary disease, unspecified: Secondary | ICD-10-CM | POA: Diagnosis present

## 2012-06-08 DIAGNOSIS — I69991 Dysphagia following unspecified cerebrovascular disease: Secondary | ICD-10-CM | POA: Insufficient documentation

## 2012-06-08 DIAGNOSIS — Z950 Presence of cardiac pacemaker: Secondary | ICD-10-CM | POA: Insufficient documentation

## 2012-06-08 DIAGNOSIS — K219 Gastro-esophageal reflux disease without esophagitis: Secondary | ICD-10-CM | POA: Insufficient documentation

## 2012-06-08 DIAGNOSIS — F0390 Unspecified dementia without behavioral disturbance: Secondary | ICD-10-CM

## 2012-06-08 DIAGNOSIS — F0150 Vascular dementia without behavioral disturbance: Secondary | ICD-10-CM | POA: Insufficient documentation

## 2012-06-08 LAB — URINALYSIS, ROUTINE W REFLEX MICROSCOPIC
Glucose, UA: NEGATIVE mg/dL
Ketones, ur: 15 mg/dL — AB
Leukocytes, UA: NEGATIVE
Nitrite: NEGATIVE
Protein, ur: 100 mg/dL — AB
pH: 6 (ref 5.0–8.0)

## 2012-06-08 LAB — CBC WITH DIFFERENTIAL/PLATELET
Basophils Absolute: 0 10*3/uL (ref 0.0–0.1)
Basophils Relative: 0 % (ref 0–1)
Eosinophils Absolute: 0.1 10*3/uL (ref 0.0–0.7)
Eosinophils Relative: 1 % (ref 0–5)
HCT: 33.3 % — ABNORMAL LOW (ref 36.0–46.0)
MCH: 30.6 pg (ref 26.0–34.0)
MCHC: 32.7 g/dL (ref 30.0–36.0)
MCV: 93.5 fL (ref 78.0–100.0)
Monocytes Absolute: 0.3 10*3/uL (ref 0.1–1.0)
Platelets: 154 10*3/uL (ref 150–400)
RDW: 13.8 % (ref 11.5–15.5)
WBC: 5.6 10*3/uL (ref 4.0–10.5)

## 2012-06-08 LAB — COMPREHENSIVE METABOLIC PANEL
ALT: 11 U/L (ref 0–35)
AST: 28 U/L (ref 0–37)
CO2: 22 mEq/L (ref 19–32)
Calcium: 10.2 mg/dL (ref 8.4–10.5)
Creatinine, Ser: 0.93 mg/dL (ref 0.50–1.10)
GFR calc non Af Amer: 54 mL/min — ABNORMAL LOW (ref >90)
Sodium: 140 mEq/L (ref 135–145)
Total Protein: 8.5 g/dL — ABNORMAL HIGH (ref 6.0–8.3)

## 2012-06-08 LAB — PROTIME-INR: INR: 1.46 (ref 0.00–1.49)

## 2012-06-08 LAB — URINE MICROSCOPIC-ADD ON

## 2012-06-08 MED ORDER — SODIUM CHLORIDE 0.9 % IJ SOLN: 3.0000 mL | Freq: Two times a day (BID) | INTRAMUSCULAR | Status: DC

## 2012-06-08 MED ORDER — TRAMADOL HCL 50 MG PO TABS: 25.0000 mg | ORAL_TABLET | Freq: Two times a day (BID) | ORAL | Status: DC | PRN

## 2012-06-08 MED ORDER — FUROSEMIDE 20 MG PO TABS
20.0000 mg | ORAL_TABLET | Freq: Two times a day (BID) | ORAL | Status: DC
  Filled 2012-06-08: qty 1

## 2012-06-08 MED ORDER — ARTIFICIAL TEARS OP OINT
1.0000 "application " | TOPICAL_OINTMENT | Freq: Every day | OPHTHALMIC | Status: DC
  Administered 2012-06-09: 1 via OPHTHALMIC
  Filled 2012-06-08: qty 3.5

## 2012-06-08 MED ORDER — IPRATROPIUM-ALBUTEROL 0.5-2.5 (3) MG/3ML IN SOLN: 3.0000 mL | Freq: Four times a day (QID) | RESPIRATORY_TRACT | Status: DC | PRN

## 2012-06-08 MED ORDER — WARFARIN - PHARMACIST DOSING INPATIENT: Freq: Every day | Status: DC

## 2012-06-08 MED ORDER — IPRATROPIUM BROMIDE 0.02 % IN SOLN: 0.5000 mg | Freq: Four times a day (QID) | RESPIRATORY_TRACT | Status: DC | PRN

## 2012-06-08 MED ORDER — CITALOPRAM HYDROBROMIDE 10 MG PO TABS
10.0000 mg | ORAL_TABLET | Freq: Every day | ORAL | Status: DC
  Administered 2012-06-09: 10 mg via ORAL
  Filled 2012-06-08: qty 1

## 2012-06-08 MED ORDER — SENNA 8.6 MG PO TABS
2.0000 | ORAL_TABLET | Freq: Every day | ORAL | Status: DC
  Filled 2012-06-08 (×2): qty 2

## 2012-06-08 MED ORDER — SODIUM CHLORIDE 0.9 % IJ SOLN: 3.0000 mL | INTRAMUSCULAR | Status: DC | PRN

## 2012-06-08 MED ORDER — DONEPEZIL HCL 10 MG PO TABS
10.0000 mg | ORAL_TABLET | Freq: Every day | ORAL | Status: DC
  Filled 2012-06-08 (×2): qty 1

## 2012-06-08 MED ORDER — WARFARIN SODIUM 2.5 MG PO TABS
2.5000 mg | ORAL_TABLET | Freq: Once | ORAL | Status: DC
  Filled 2012-06-08: qty 1

## 2012-06-08 MED ORDER — SODIUM CHLORIDE 0.9 % IV SOLN: 250.0000 mL | INTRAVENOUS | Status: DC | PRN

## 2012-06-08 MED ORDER — PANTOPRAZOLE SODIUM 40 MG PO TBEC
40.0000 mg | DELAYED_RELEASE_TABLET | Freq: Every day | ORAL | Status: DC
  Administered 2012-06-09: 40 mg via ORAL
  Filled 2012-06-08: qty 1

## 2012-06-08 MED ORDER — ASPIRIN EC 81 MG PO TBEC
81.0000 mg | DELAYED_RELEASE_TABLET | Freq: Every day | ORAL | Status: DC
  Administered 2012-06-09: 81 mg via ORAL
  Filled 2012-06-08: qty 1

## 2012-06-08 MED ORDER — ACETAMINOPHEN 325 MG PO TABS
650.0000 mg | ORAL_TABLET | Freq: Two times a day (BID) | ORAL | Status: DC
  Administered 2012-06-09: 650 mg via ORAL
  Filled 2012-06-08: qty 2

## 2012-06-08 MED ORDER — LOSARTAN POTASSIUM 50 MG PO TABS
50.0000 mg | ORAL_TABLET | Freq: Every day | ORAL | Status: DC
  Administered 2012-06-09: 50 mg via ORAL
  Filled 2012-06-08: qty 1

## 2012-06-08 MED ORDER — NITROFURANTOIN MACROCRYSTAL 100 MG PO CAPS
100.0000 mg | ORAL_CAPSULE | Freq: Every day | ORAL | Status: DC
  Administered 2012-06-09: 100 mg via ORAL
  Filled 2012-06-08: qty 1

## 2012-06-08 MED ORDER — LEVOTHYROXINE SODIUM 75 MCG PO TABS
75.0000 ug | ORAL_TABLET | Freq: Every day | ORAL | Status: DC
  Administered 2012-06-09: 75 ug via ORAL
  Filled 2012-06-08: qty 1

## 2012-06-08 MED ORDER — TIOTROPIUM BROMIDE MONOHYDRATE 18 MCG IN CAPS
18.0000 ug | ORAL_CAPSULE | Freq: Every day | RESPIRATORY_TRACT | Status: DC
  Filled 2012-06-08 (×2): qty 5

## 2012-06-08 MED ORDER — ALBUTEROL SULFATE (5 MG/ML) 0.5% IN NEBU: 2.5000 mg | INHALATION_SOLUTION | Freq: Four times a day (QID) | RESPIRATORY_TRACT | Status: DC | PRN

## 2012-06-08 NOTE — ED Notes (Signed)
Pt yelling out of room, upon entering room, pt c/o arm hurting where blood pressure cuff is, BP cuff removed, pt calmed, stating she was looking for her daughter, explained that her daughter went home and would be back soon. Pt calmer at this time.

## 2012-06-08 NOTE — ED Provider Notes (Signed)
History     CSN: [id number]  Arrival date & time 06/08/12  6295   First MD Initiated Contact with Patient 06/08/12 1841      Chief Complaint  Patient presents with  . Altered Mental Status    (Consider location/radiation/quality/duration/timing/severity/associated sxs/prior treatment) Patient is a 78 y.o. female presenting with altered mental status. The history is provided by a relative.  Altered Mental Status This is a new problem. The current episode started today. The problem occurs constantly. The problem has been unchanged. Pertinent negatives include no coughing, headaches or vomiting. Nothing aggravates the symptoms. She has tried nothing for the symptoms.    Past Medical History  Diagnosis Date  . Hypertension   . Hyperlipidemia   . Hypothyroidism   . Atrial fibrillation   . Pacemaker   . Shortness of breath   . CHF (congestive heart failure) 05/27/2006; 05/30/2006; 11/06/2008  . Vascular dementia     "due to CVA 06/08/2009"  . Stroke 03/06/2001; 05/30/2009    "light stroke"  . Stroke 06/08/2009    "probable small TIA"  . Stroke 01/06/2002  . Angina 12/22/2003  . Ventilator dependent 06/08/2009    respiratory failure  . H/O: rheumatic fever     "childhood"  . Coronary artery disease   . Left atrial enlargement   . H/O cardiomegaly     diastolic  . Pulmonary hypertension   . Anemia     "iron transfusions"  . Blood transfusion   . History of lower GI bleeding     "multiple; required blood transfusions"  . COPD (chronic obstructive pulmonary disease)   . GERD (gastroesophageal reflux disease)   . H/O hiatal hernia   . H/O chronic bronchitis   . Asthma   . Arthritis   . Anxiety   . Depression   . Dysphagia as late effect of stroke   . Autoimmune hepatitis 07/12/1999  . Bilateral renal cysts     "2 large on right off the lower pole"  . Chronic renal insufficiency   . Pleural effusion     "moderate right & small left"  . Bacterial pneumonia 11/06/2008;  10/23/2009; 03/30/2010  . Avascular necrosis of femoral head     right    Past Surgical History  Procedure Date  . Insert / replace / remove pacemaker 09/11/2000  . Av node ablation "06/06/2005"  . Percutaneous tracheostomy 06/15/2009  . Electroencephalogram 01/28/2002; 11/26/2005; 10/23/2009  . Cardiac catheterization 06/14/1999; 12/26/2003; 05/30/2009  . Transcranial doppler & carotid ultrasound 11/28/2005  . Cardiac valve replacement 08/20/1999    "mitral valve replacement; [employer]"  . Cardiac valve replacement 06/05/2009     "redo sternotomy & aortic valve replacement; tissue valve"  . Cauterized cecal av malformation   . Abdominal hysterectomy 1960's  . Dental implants removed 03/04/2001    lower  . Knee arthroscopy 12/26/2000    left  . Knee arthroscopy 07/29/2001    right    No family history on file.  History  Substance Use Topics  . Smoking status: Former Smoker -- 1.0 packs/day for 30 years    Types: Cigarettes    Quit date: 10/14/1970  . Smokeless tobacco: Never Used  . Alcohol Use: No    OB History    Grav Para Term Preterm Abortions TAB SAB Ect Mult Living                  Review of Systems  Unable to perform ROS: Mental status change  Respiratory: Negative  for cough.   Gastrointestinal: Negative for vomiting.  Neurological: Negative for headaches.  Psychiatric/Behavioral: Positive for altered mental status.    Allergies  Advil; Alprazolam; Relafen; Sulfa antibiotics; and Toprol xl  Home Medications   Current Outpatient Rx  Name Route Sig Dispense Refill  . ACETAMINOPHEN 325 MG PO TABS Oral Take 650 mg by mouth 2 (two) times daily.    . ARTIFICIAL TEARS OP OINT Both Eyes Place 1 application into both eyes at bedtime.    Marland Kitchen CITALOPRAM HYDROBROMIDE 10 MG PO TABS Oral Take 10 mg by mouth daily.    . DONEPEZIL HCL 10 MG PO TABS Oral Take 10 mg by mouth at bedtime.    . FUROSEMIDE 20 MG PO TABS Oral Take 20 mg by mouth 2 (two) times daily.    .  IPRATROPIUM-ALBUTEROL 0.5-2.5 (3) MG/3ML IN SOLN Nebulization Take 3 mLs by nebulization every 6 (six) hours as needed. For shortness of breath    . LEVOTHYROXINE SODIUM 75 MCG PO TABS Oral Take 75 mcg by mouth daily.    Marland Kitchen LOSARTAN POTASSIUM 50 MG PO TABS Oral Take 50 mg by mouth daily.    Marland Kitchen NITROFURANTOIN MACROCRYSTAL 100 MG PO CAPS Oral Take 100 mg by mouth daily.    Marland Kitchen OMEPRAZOLE 20 MG PO CPDR Oral Take 20 mg by mouth daily.    Jeralyn Bennett CALCIUM 500 MG PO TABS Oral Take 500 mg of elemental calcium by mouth 3 (three) times daily.    . SENNA 8.6 MG PO TABS Oral Take 2 tablets by mouth at bedtime.    Marland Kitchen TIOTROPIUM BROMIDE MONOHYDRATE 18 MCG IN CAPS Inhalation Place 18 mcg into inhaler and inhale daily.    . TRAMADOL HCL 50 MG PO TABS Oral Take 25 mg by mouth 2 (two) times daily as needed. For pain    . WARFARIN SODIUM 5 MG PO TABS Oral Take 2.5 mg by mouth daily.       Pulse 60  Temp 98.3 F (36.8 C) (Axillary)  Resp 18  SpO2 100%  Physical Exam  Constitutional: She appears well-developed and well-nourished. No distress.  HENT:  Head: Normocephalic and atraumatic.  Right Ear: External ear normal.  Left Ear: External ear normal.  Nose: Nose normal.  Mouth/Throat: Oropharynx is clear and moist.  Eyes: EOM are normal. Pupils are equal, round, and reactive to light.  Neck: Neck supple.  Cardiovascular: Normal rate, regular rhythm, normal heart sounds and intact distal pulses.   Pulmonary/Chest: Effort normal and breath sounds normal. No respiratory distress. She has no wheezes. She has no rales.  Abdominal: Soft. She exhibits no distension. There is no tenderness.  Musculoskeletal: She exhibits no edema and no tenderness.  Neurological: She is disoriented. GCS eye subscore is 4. GCS verbal subscore is 4. GCS motor subscore is 6.       Grossly moves all 4 extremities but is slow to follow commands  Skin: Skin is warm and dry. She is not diaphoretic.    ED Course  Procedures (including  critical care time)  Labs Reviewed  CBC WITH DIFFERENTIAL - Abnormal; Notable for the following:    RBC 3.56 (*)     Hemoglobin 10.9 (*)     HCT 33.3 (*)     Neutrophils Relative 82 (*)     All other components within normal limits  COMPREHENSIVE METABOLIC PANEL - Abnormal; Notable for the following:    Glucose, Bld 101 (*)     Total Protein 8.5 (*)  GFR calc non Af Amer 54 (*)     GFR calc Af Amer 62 (*)     All other components within normal limits  PROTIME-INR - Abnormal; Notable for the following:    Prothrombin Time 18.0 (*)     All other components within normal limits  URINALYSIS, ROUTINE W REFLEX MICROSCOPIC - Abnormal; Notable for the following:    Color, Urine AMBER (*)  BIOCHEMICALS MAY BE AFFECTED BY COLOR   Hgb urine dipstick TRACE (*)     Bilirubin Urine SMALL (*)     Ketones, ur 15 (*)     Protein, ur 100 (*)     All other components within normal limits  URINE MICROSCOPIC-ADD ON   Dg Chest 2 View  06/08/2012  *RADIOLOGY REPORT*  Clinical Data: Altered mental status.  CHEST - 2 VIEW  Comparison: Plain films of the chest 02/05/2012 and 02/20/2010.  Findings: There is cardiomegaly with a pacing device in place. Lungs are clear.  No pneumothorax or pleural fluid.  The patient is status post mitral and aortic valve replacement.  IMPRESSION: Cardiomegaly without acute disease.   Original Report Authenticated By: Bernadene Bell. Maricela Curet, M.D.    Ct Head Wo Contrast  06/08/2012  *RADIOLOGY REPORT*  Clinical Data: Altered mental status.  CT HEAD WITHOUT CONTRAST  Technique:  Contiguous axial images were obtained from the base of the skull through the vertex without contrast.  Comparison: 02/05/2012.  Findings: Multiple images were repeated due to patient motion.  No significant change in diffuse enlargement of the ventricles and subarachnoid spaces.  And old right frontotemporal infarct is unchanged.  Stable old bilateral cerebellar infarcts and patchy white matter low density in  the left cerebral hemisphere.  No intracranial hemorrhage, mass lesion or CT evidence of acute infarction.  Unremarkable bones and included paranasal sinuses.  IMPRESSION:  1.  No acute abnormality. 2.  Stable atrophy, old infarcts and chronic small vessel white matter ischemic changes.   Original Report Authenticated By: Darrol Angel, M.D.      1. Altered mental status       MDM  78 yo female with acute onset of AMS over past 2 hours per NH. No focal deficits except for decreased mentation and trouble following commands. Similar to last UTI per daughter. Code stroke not called due to generalized symptoms without focal deficits. Workup neg for bleed, infection. Doubt tox. Patient improving, but still not back at baseline. No fevers, and with acute onset doubt meningitis, esp with improving symptoms. Possibly TIA. Will admit to family medicine based on continued but improving AMS.        Pricilla Loveless, MD 06/08/12 [phone]

## 2012-06-08 NOTE — ED Notes (Signed)
Pt daughter phone numbers [place] Cell [phone] Home [phone] Work [phone] after noon on 8/27 if needed

## 2012-06-08 NOTE — ED Notes (Signed)
Pt returned to room from radiology, no distress noted 

## 2012-06-08 NOTE — ED Notes (Signed)
Pt from [facility] home.  Per EMS, pt was able to speak full sentences at 1630 and now not responding.  Pt has hx of stroke.  Pt has drooping on left side from ?previous stroke.    Glucose 102.

## 2012-06-08 NOTE — ED Notes (Signed)
MD at bedside. 

## 2012-06-08 NOTE — H&P (Signed)
History and Physical Note Family Medicine Teaching Service Savannah Kreamer M. Sotirios Navarro, MD Service Pager: [phone]  [name] is an 78 y.o. female.   Chief Complaint: Altered mental status HPI: Pt is an 78 yo F with complex PMH including vascular dementia, HTN, Afib (s/p pacemaker on chronic coumadin), CHF, COPD and hypothyroidism who was brought to the ED from her rehab facility [facility]) for acute onset altered mental status. Level IV Caveat applies as patient is demented and has some degree of altered mental status from baseline, and she was alone during interview. The rest of the history was taken from ED provider and chart review. Patient was in her normal state of health until a few hours prior to arrival to the ED. Daughter told EDP that she had difficulty speaking, drooling from the mouth and questionable increased weakness. Patient was brought to ED where she had labs (CBC, Cmet and UA unremarkable) as well as a head CT which did not show any acute abnormality. Throughout her time in the ED, patient's mental status improved greatly. She began communicating and answering questions, but daughter told EDP that she was still not to her baseline.  On review of chart, patient had a very similar presentation 4 months ago. At that time, neuro was consulted. She had an EEG which showed encephalopathy likely secondary to UTI. She did not have an MRI at that time due to pacemaker.   Since patient's mental status was not back to baseline, FMTS was called for admission.  Past Medical History  Diagnosis Date  . Hypertension   . Hyperlipidemia   . Hypothyroidism   . Atrial fibrillation   . Pacemaker   . Shortness of breath   . CHF (congestive heart failure) 05/27/2006; 05/30/2006; 11/06/2008  . Vascular dementia     "due to CVA 06/08/2009"  . Stroke 03/06/2001; 05/30/2009    "light stroke"  . Stroke 06/08/2009    "probable small TIA"  . Stroke 01/06/2002  . Angina 12/22/2003  . Ventilator dependent  06/08/2009    respiratory failure  . H/O: rheumatic fever     "childhood"  . Coronary artery disease   . Left atrial enlargement   . H/O cardiomegaly     diastolic  . Pulmonary hypertension   . Anemia     "iron transfusions"  . Blood transfusion   . History of lower GI bleeding     "multiple; required blood transfusions"  . COPD (chronic obstructive pulmonary disease)   . GERD (gastroesophageal reflux disease)   . H/O hiatal hernia   . H/O chronic bronchitis   . Asthma   . Arthritis   . Anxiety   . Depression   . Dysphagia as late effect of stroke   . Autoimmune hepatitis 07/12/1999  . Bilateral renal cysts     "2 large on right off the lower pole"  . Chronic renal insufficiency   . Pleural effusion     "moderate right & small left"  . Bacterial pneumonia 11/06/2008; 10/23/2009; 03/30/2010  . Avascular necrosis of femoral head     right    Past Surgical History  Procedure Date  . Insert / replace / remove pacemaker 09/11/2000  . Av node ablation "06/06/2005"  . Percutaneous tracheostomy 06/15/2009  . Electroencephalogram 01/28/2002; 11/26/2005; 10/23/2009  . Cardiac catheterization 06/14/1999; 12/26/2003; 05/30/2009  . Transcranial doppler & carotid ultrasound 11/28/2005  . Cardiac valve replacement 08/20/1999    "mitral valve replacement; [employer]"  . Cardiac valve replacement 06/05/2009     "  redo sternotomy & aortic valve replacement; tissue valve"  . Cauterized cecal av malformation   . Abdominal hysterectomy 1960's  . Dental implants removed 03/04/2001    lower  . Knee arthroscopy 12/26/2000    left  . Knee arthroscopy 07/29/2001    right   No family history on file. Social History:  reports that she quit smoking about 41 years ago. Her smoking use included Cigarettes. She has a 30 pack-year smoking history. She has never used smokeless tobacco. She reports that she does not drink alcohol or use illicit drugs. Currently lives at [facility]  Allergies:    Allergies  Allergen Reactions  . Advil (Ibuprofen) Other (See Comments)    "think it just doesn't work for her"  . Alprazolam Other (See Comments)    "don't remember what happens"  . Relafen (Nabumetone) Other (See Comments)    "don't remember what happens"  . Sulfa Antibiotics Other (See Comments)    "don't remember what happens"  . Toprol Xl (Metoprolol Succinate) Other (See Comments)    25mg ; "don't remember what happens"   Prior to Admission medications   Medication Sig Start Date End Date Taking? Authorizing Provider  acetaminophen (TYLENOL) 325 MG tablet Take 650 mg by mouth 2 (two) times daily.   Yes Historical Provider, MD  artificial tears (LACRILUBE) OINT ophthalmic ointment Place 1 application into both eyes at bedtime.   Yes Historical Provider, MD  citalopram (CELEXA) 10 MG tablet Take 10 mg by mouth daily.   Yes Historical Provider, MD  donepezil (ARICEPT) 10 MG tablet Take 10 mg by mouth at bedtime.   Yes Historical Provider, MD  furosemide (LASIX) 20 MG tablet Take 20 mg by mouth 2 (two) times daily.   Yes Historical Provider, MD  ipratropium-albuterol (DUONEB) 0.5-2.5 (3) MG/3ML SOLN Take 3 mLs by nebulization every 6 (six) hours as needed. For shortness of breath   Yes Historical Provider, MD  levothyroxine (SYNTHROID, LEVOTHROID) 75 MCG tablet Take 75 mcg by mouth daily.   Yes Historical Provider, MD  losartan (COZAAR) 50 MG tablet Take 50 mg by mouth daily.   Yes Historical Provider, MD  nitrofurantoin (MACRODANTIN) 100 MG capsule Take 100 mg by mouth daily.   Yes Historical Provider, MD  omeprazole (PRILOSEC) 20 MG capsule Take 20 mg by mouth daily.   Yes Historical Provider, MD  Ethelda Chick (OYSTER CALCIUM) 500 MG TABS Take 500 mg of elemental calcium by mouth 3 (three) times daily.   Yes Historical Provider, MD  senna (SENOKOT) 8.6 MG TABS Take 2 tablets by mouth at bedtime.   Yes Historical Provider, MD  tiotropium (SPIRIVA) 18 MCG inhalation capsule Place 18 mcg  into inhaler and inhale daily.   Yes Historical Provider, MD  traMADol (ULTRAM) 50 MG tablet Take 25 mg by mouth 2 (two) times daily as needed. For pain   Yes Historical Provider, MD  warfarin (COUMADIN) 5 MG tablet Take 2.5 mg by mouth daily.    Yes Historical Provider, MD   Results for orders placed during the hospital encounter of 06/08/12 (from the past 48 hour(s))  CBC WITH DIFFERENTIAL     Status: Abnormal   Collection Time   06/08/12  6:53 PM      Component Value Range Comment   WBC 5.6  4.0 - 10.5 K/uL    RBC 3.56 (*) 3.87 - 5.11 MIL/uL    Hemoglobin 10.9 (*) 12.0 - 15.0 g/dL    HCT 98.1 (*) 19.1 -  46.0 %    MCV 93.5  78.0 - 100.0 fL    MCH 30.6  26.0 - 34.0 pg    MCHC 32.7  30.0 - 36.0 g/dL    RDW 16.1  09.6 - 04.5 %    Platelets 154  150 - 400 K/uL    Neutrophils Relative 82 (*) 43 - 77 %    Neutro Abs 4.6  1.7 - 7.7 K/uL    Lymphocytes Relative 12  12 - 46 %    Lymphs Abs 0.7  0.7 - 4.0 K/uL    Monocytes Relative 5  3 - 12 %    Monocytes Absolute 0.3  0.1 - 1.0 K/uL    Eosinophils Relative 1  0 - 5 %    Eosinophils Absolute 0.1  0.0 - 0.7 K/uL    Basophils Relative 0  0 - 1 %    Basophils Absolute 0.0  0.0 - 0.1 K/uL   COMPREHENSIVE METABOLIC PANEL     Status: Abnormal   Collection Time   06/08/12  6:53 PM      Component Value Range Comment   Sodium 140  135 - 145 mEq/L    Potassium 3.5  3.5 - 5.1 mEq/L    Chloride 104  96 - 112 mEq/L    CO2 22  19 - 32 mEq/L    Glucose, Bld 101 (*) 70 - 99 mg/dL    BUN 12  6 - 23 mg/dL    Creatinine, Ser 4.09  0.50 - 1.10 mg/dL    Calcium 81.1  8.4 - 10.5 mg/dL    Total Protein 8.5 (*) 6.0 - 8.3 g/dL    Albumin 3.5  3.5 - 5.2 g/dL    AST 28  0 - 37 U/L    ALT 11  0 - 35 U/L    Alkaline Phosphatase 84  39 - 117 U/L    Total Bilirubin 0.7  0.3 - 1.2 mg/dL    GFR calc non Af Amer 54 (*) >90 mL/min    GFR calc Af Amer 62 (*) >90 mL/min   PROTIME-INR     Status: Abnormal   Collection Time   06/08/12  6:53 PM      Component Value  Range Comment   Prothrombin Time 18.0 (*) 11.6 - 15.2 seconds    INR 1.46  0.00 - 1.49   URINALYSIS, ROUTINE W REFLEX MICROSCOPIC     Status: Abnormal   Collection Time   06/08/12  8:53 PM      Component Value Range Comment   Color, Urine [name] (*) YELLOW BIOCHEMICALS MAY BE AFFECTED BY COLOR   APPearance CLEAR  CLEAR    Specific Gravity, Urine 1.023  1.005 - 1.030    pH 6.0  5.0 - 8.0    Glucose, UA NEGATIVE  NEGATIVE mg/dL    Hgb urine dipstick TRACE (*) NEGATIVE    Bilirubin Urine SMALL (*) NEGATIVE    Ketones, ur 15 (*) NEGATIVE mg/dL    Protein, ur 914 (*) NEGATIVE mg/dL    Urobilinogen, UA 1.0  0.0 - 1.0 mg/dL    Nitrite NEGATIVE  NEGATIVE    Leukocytes, UA NEGATIVE  NEGATIVE   URINE MICROSCOPIC-ADD ON     Status: Normal   Collection Time   06/08/12  8:53 PM      Component Value Range Comment   Squamous Epithelial / LPF RARE  RARE    RBC / HPF 0-2  <3 RBC/hpf  Dg Chest 2 View  06/08/2012  *RADIOLOGY REPORT*  Clinical Data: Altered mental status.  CHEST - 2 VIEW  Comparison: Plain films of the chest 02/05/2012 and 02/20/2010.  Findings: There is cardiomegaly with a pacing device in place. Lungs are clear.  No pneumothorax or pleural fluid.  The patient is status post mitral and aortic valve replacement.  IMPRESSION: Cardiomegaly without acute disease.   Original Report Authenticated By: Bernadene Bell. Maricela Curet, M.D.    Ct Head Wo Contrast  06/08/2012  *RADIOLOGY REPORT*  Clinical Data: Altered mental status.  CT HEAD WITHOUT CONTRAST  Technique:  Contiguous axial images were obtained from the base of the skull through the vertex without contrast.  Comparison: 02/05/2012.  Findings: Multiple images were repeated due to patient motion.  No significant change in diffuse enlargement of the ventricles and subarachnoid spaces.  And old right frontotemporal infarct is unchanged.  Stable old bilateral cerebellar infarcts and patchy white matter low density in the left cerebral hemisphere.  No  intracranial hemorrhage, mass lesion or CT evidence of acute infarction.  Unremarkable bones and included paranasal sinuses.  IMPRESSION:  1.  No acute abnormality. 2.  Stable atrophy, old infarcts and chronic small vessel white matter ischemic changes.   Original Report Authenticated By: Darrol Angel, M.D.    ROS Patient unable to answer full ROS due to mental status. Does point to epigastric area when asked about pain, but otherwise, unable to answer.  Blood pressure 176/88, pulse 62, temperature 98.3 F (36.8 C), temperature source Axillary, resp. rate 17, SpO2 100.00%. Physical Exam  Gen: Frail appearing elderly lady sitting on stretcher. Constantly pulling at IV and BP cuff. Does not make eye contact. Will answer questions HEENT: AT, [place] CV: RRR, no murmur appreciated. Healed scar midline chest Pulm: Poor effort. No crackles or wheezes noted anterior or laterally Abd: Soft, nontender, no guarding. Diaper in place Neuro: Difficult to assess. Patient is oriented to person, "medical center" and month of August. States it is Saturday, August 24 and she knows that because it is the day after her birthday. She does not know who the president is. Does not know why she is in the hospital. ?facial droop. ?slurred speech. Cannot assess cranial nerves at admission. Able to grip bilaterally, although fingers of left hand appear contracted. Able to bend knees and ankles without assistance. Will press feet down when asked and strength appears equal bilaterally. Ext: Nontender, no edema  Assessment/Plan 78 yo F with complex PMH presenting for altered mental status  # AMS- Could be acute stroke vs.TIA vs. Infection. Her symptoms appear to be improving, although that is only from report. Daughter not available for interview. CT does not show an acute process and patient is unable to have MRI due to pacemaker. - Admit to observation given her AMS - Monitor on telemetry given her Afib and cardiac history,  now with AMS of unknown cause - Will have sitter at bedside since patient has dementia at baseline, and could use frequent re-direction if she is confused - Will get TSH and A1c in the AM. Do not feel that echo or carotid would be beneficial in her work up at this time - RN stroke swallow screen. NPO until she passes swallow eval - PT/OT/SLP consults for the AM - On chronic Coumadin; will continue per pharmacy - Add ASA 81mg  - I did not consult neurology at admission since patient was beginning to clear, but if she remains altered or has new findings,  we will have low threshold for calling a consult - Patient had similar presentation with UTI in past. On ppx at home and UA appeared clear. Will get urine culture to double check we are not missing an occult infection - Afebrile with normal WBC. If she spikes a temp, will get blood cultures and start empiric antibiotics - Monitor on floor with vitals per protocol, neuro checks and telemetry  # HTN- BP elevated.  - Resume home Lasix and Cozaar. - Will add prn Labetalol for SBP >200 or DBP >120  # Dementia- known vascular dementia. Unable to get MMSE on admission due to AMS - Continue Aricept and Celexa (home medications) - Sitter at bedside - Will avoid sedating medications, if possible, given AMS  # Hypothyroidism- - Continue home Synthroid dose - TSH with AM labs  # Afib- Did not appear to be in Afib on my exam. Does have a pacemaker. Currently INR is subtherapeutic.  - Continue Coumadin per pharmacy - Monitor on telemetry  # COPD- extensive smoking history, per chart records - Spiriva daily - DuoNeb prn for wheezing and shortness of breath  # FEN/GI- NPO until passes swallow screen. Protonix 40mg . Senna for constipation, but can add Miralax if needed # PPx- Coumadin per pharmacy # Dispo- Pending further workup and discussion with daughter about patient's clinical status. Will be returning to [facility], and CSW consult has been  placed for that. Will also need to speak with daughter about goals of care, as I do not see these laid out in any other notes in our EMR.  Contact: Patient's Daughter is her primary contact. Her name is Anikah Hogge Cell: [phone] Home: [phone] Work: [phone] (after 12:00 on 8/27)  [name] 06/08/2012, 10:44 PM

## 2012-06-08 NOTE — ED Notes (Signed)
Pt to radiology at this time, pt alert, talking to family, remains confused.

## 2012-06-08 NOTE — Progress Notes (Addendum)
ANTICOAGULATION CONSULT NOTE - Initial Consult  Pharmacy Consult for Coumadin Indication: afib, CVA  Allergies  Allergen Reactions  . Advil (Ibuprofen) Other (See Comments)    "think it just doesn't work for her"  . Alprazolam Other (See Comments)    "don't remember what happens"  . Relafen (Nabumetone) Other (See Comments)    "don't remember what happens"  . Sulfa Antibiotics Other (See Comments)    "don't remember what happens"  . Toprol Xl (Metoprolol Succinate) Other (See Comments)    25mg ; "don't remember what happens"    Patient Measurements:    Vital Signs: Temp: 98.3 F (36.8 C) (08/26 1836) Temp src: Axillary (08/26 1836) BP: 176/88 mmHg (08/26 2100) Pulse Rate: 62  (08/26 2100)  Labs:  Basename 06/08/12 1853  HGB 10.9*  HCT 33.3*  PLT 154  APTT --  LABPROT 18.0*  INR 1.46  HEPARINUNFRC --  CREATININE 0.93  CKTOTAL --  CKMB --  TROPONINI --    The CrCl is unknown because both a height and weight (above a minimum accepted value) are required for this calculation.   Medical History: Past Medical History  Diagnosis Date  . Hypertension   . Hyperlipidemia   . Hypothyroidism   . Atrial fibrillation   . Pacemaker   . Shortness of breath   . CHF (congestive heart failure) 05/27/2006; 05/30/2006; 11/06/2008  . Vascular dementia     "due to CVA 06/08/2009"  . Stroke 03/06/2001; 05/30/2009    "light stroke"  . Stroke 06/08/2009    "probable small TIA"  . Stroke 01/06/2002  . Angina 12/22/2003  . Ventilator dependent 06/08/2009    respiratory failure  . H/O: rheumatic fever     "childhood"  . Coronary artery disease   . Left atrial enlargement   . H/O cardiomegaly     diastolic  . Pulmonary hypertension   . Anemia     "iron transfusions"  . Blood transfusion   . History of lower GI bleeding     "multiple; required blood transfusions"  . COPD (chronic obstructive pulmonary disease)   . GERD (gastroesophageal reflux disease)   . H/O hiatal hernia     . H/O chronic bronchitis   . Asthma   . Arthritis   . Anxiety   . Depression   . Dysphagia as late effect of stroke   . Autoimmune hepatitis 07/12/1999  . Bilateral renal cysts     "2 large on right off the lower pole"  . Chronic renal insufficiency   . Pleural effusion     "moderate right & small left"  . Bacterial pneumonia 11/06/2008; 10/23/2009; 03/30/2010  . Avascular necrosis of femoral head     right    Medications:  Prescriptions prior to admission  Medication Sig Dispense Refill  . acetaminophen (TYLENOL) 325 MG tablet Take 650 mg by mouth 2 (two) times daily.      Marland Kitchen artificial tears (LACRILUBE) OINT ophthalmic ointment Place 1 application into both eyes at bedtime.      . citalopram (CELEXA) 10 MG tablet Take 10 mg by mouth daily.      Marland Kitchen donepezil (ARICEPT) 10 MG tablet Take 10 mg by mouth at bedtime.      . furosemide (LASIX) 20 MG tablet Take 20 mg by mouth 2 (two) times daily.      Marland Kitchen ipratropium-albuterol (DUONEB) 0.5-2.5 (3) MG/3ML SOLN Take 3 mLs by nebulization every 6 (six) hours as needed. For shortness of breath      .  levothyroxine (SYNTHROID, LEVOTHROID) 75 MCG tablet Take 75 mcg by mouth daily.      Marland Kitchen losartan (COZAAR) 50 MG tablet Take 50 mg by mouth daily.      . nitrofurantoin (MACRODANTIN) 100 MG capsule Take 100 mg by mouth daily.      Marland Kitchen omeprazole (PRILOSEC) 20 MG capsule Take 20 mg by mouth daily.      Ethelda Chick (OYSTER CALCIUM) 500 MG TABS Take 500 mg of elemental calcium by mouth 3 (three) times daily.      Marland Kitchen senna (SENOKOT) 8.6 MG TABS Take 2 tablets by mouth at bedtime.      Marland Kitchen tiotropium (SPIRIVA) 18 MCG inhalation capsule Place 18 mcg into inhaler and inhale daily.      . traMADol (ULTRAM) 50 MG tablet Take 25 mg by mouth 2 (two) times daily as needed. For pain      . warfarin (COUMADIN) 5 MG tablet Take 2.5 mg by mouth daily.         Assessment: 78 yo lady to continue coumadin.  Admit INR subtherapeutic on dose of 2.5 mg daily, last dose  today.   Goal of Therapy:  INR 2-3 Monitor platelets by anticoagulation protocol: Yes   Plan:  Will give additional 2.5 mg tonight. Monitor for S&S bleeding. Check INR/PT daily.   [name] 06/08/2012,11:51 PM

## 2012-06-09 ENCOUNTER — Encounter (HOSPITAL_COMMUNITY): Payer: Self-pay | Admitting: General Practice

## 2012-06-09 LAB — CBC
MCH: 30.5 pg (ref 26.0–34.0)
MCV: 92 fL (ref 78.0–100.0)
Platelets: 174 10*3/uL (ref 150–400)
RBC: 3.51 MIL/uL — ABNORMAL LOW (ref 3.87–5.11)

## 2012-06-09 LAB — COMPREHENSIVE METABOLIC PANEL
ALT: 10 U/L (ref 0–35)
AST: 29 U/L (ref 0–37)
Albumin: 3.8 g/dL (ref 3.5–5.2)
Alkaline Phosphatase: 87 U/L (ref 39–117)
Chloride: 104 mEq/L (ref 96–112)
Creatinine, Ser: 0.99 mg/dL (ref 0.50–1.10)
Potassium: 3.4 mEq/L — ABNORMAL LOW (ref 3.5–5.1)
Sodium: 143 mEq/L (ref 135–145)
Total Bilirubin: 1 mg/dL (ref 0.3–1.2)

## 2012-06-09 LAB — PROTIME-INR: Prothrombin Time: 18.6 seconds — ABNORMAL HIGH (ref 11.6–15.2)

## 2012-06-09 MED ORDER — WARFARIN SODIUM 5 MG IV SOLR
5.0000 mg | Freq: Once | INTRAVENOUS | Status: DC
  Filled 2012-06-09: qty 2.5

## 2012-06-09 MED ORDER — WARFARIN SODIUM 5 MG PO TABS
5.0000 mg | ORAL_TABLET | Freq: Once | ORAL | Status: DC
  Filled 2012-06-09: qty 1

## 2012-06-09 MED ORDER — CEPHALEXIN 500 MG PO CAPS: 500.0000 mg | ORAL_CAPSULE | Freq: Every day | ORAL | Status: AC

## 2012-06-09 MED ORDER — POTASSIUM CHLORIDE 10 MEQ/100ML IV SOLN
10.0000 meq | INTRAVENOUS | Status: AC
  Filled 2012-06-09 (×2): qty 100

## 2012-06-09 MED ORDER — WARFARIN SODIUM 5 MG IV SOLR
2.5000 mg | Freq: Once | INTRAVENOUS | Status: AC
  Administered 2012-06-09: 2.5 mg via INTRAVENOUS
  Filled 2012-06-09: qty 1.25

## 2012-06-09 MED ORDER — POTASSIUM CHLORIDE CRYS ER 20 MEQ PO TBCR
20.0000 meq | EXTENDED_RELEASE_TABLET | Freq: Two times a day (BID) | ORAL | Status: DC
  Administered 2012-06-09: 20 meq via ORAL

## 2012-06-09 MED ORDER — ENOXAPARIN SODIUM 40 MG/0.4ML ~~LOC~~ SOLN
40.0000 mg | SUBCUTANEOUS | Status: DC
  Administered 2012-06-09: 40 mg via SUBCUTANEOUS
  Filled 2012-06-09 (×2): qty 0.4

## 2012-06-09 MED ORDER — FUROSEMIDE 10 MG/ML IJ SOLN
20.0000 mg | Freq: Once | INTRAMUSCULAR | Status: AC
  Administered 2012-06-09: 20 mg via INTRAVENOUS
  Filled 2012-06-09: qty 2

## 2012-06-09 MED ORDER — WARFARIN SODIUM 5 MG PO TABS: ORAL_TABLET | ORAL | Status: DC

## 2012-06-09 MED ORDER — ASPIRIN 81 MG PO TBEC: 81.0000 mg | DELAYED_RELEASE_TABLET | Freq: Every day | ORAL | Status: AC

## 2012-06-09 MED ORDER — HYDRALAZINE HCL 20 MG/ML IJ SOLN: 5.0000 mg | INTRAMUSCULAR | Status: DC | PRN

## 2012-06-09 NOTE — Progress Notes (Signed)
Daily Progress Note Family Medicine Teaching Service [name] M. Savannah Spivack, MD Service Pager: [phone]   Subjective: Patient awake and appears well this morning. No complaints. I did speak with her daughter, [name], who feels like her mother is back to her baseline.   Objective: Vital signs in last 24 hours: Temp:  [98 F (36.7 C)-98.4 F (36.9 C)] 98 F (36.7 C) (08/27 0800) Pulse Rate:  [60-63] 62  (08/27 0800) Resp:  [15-18] 18  (08/27 0800) BP: (152-182)/(72-106) 172/93 mmHg (08/27 0800) SpO2:  [96 %-100 %] 96 % (08/27 0800) Weight:  [137 lb (62.143 kg)] 137 lb (62.143 kg) Jun 13, 2023 2358) Weight change:    I/O not recorded  Physical Exam: Gen: Elderly appearing woman, lying in bed, NAD HEENT: AT, [name]. Has ointment around eyes CV: RRR Pulm: CTA anterior Abd: Soft, nontender Neuro: Facial droop, weakness on left side, unchanged MSK: left hand contracted. No edema Skin: no signs of bruising  Lab Results:  [name] 06/09/12 0558 2012-06-12 1853  WBC 6.2 5.6  HGB 10.7* 10.9*  HCT 32.3* 33.3*  PLT 174 154   BMET  Basename 06/09/12 0558 06/12/12 1853  NA 143 140  K 3.4* 3.5  CL 104 104  CO2 27 22  GLUCOSE 81 101*  BUN 12 12  CREATININE 0.99 0.93  CALCIUM 10.6* 10.2   Studies/Results: Dg Chest 2 View Jun 12, 2012  Findings: There is cardiomegaly with a pacing device in place. Lungs are clear.  No pneumothorax or pleural fluid.  The patient is status post mitral and aortic valve replacement.  IMPRESSION: Cardiomegaly without acute disease.   Ct Head Wo Contrast 2012-06-12 Comparison: 02/05/2012.  Findings: Multiple images were repeated due to patient motion.  No significant change in diffuse enlargement of the ventricles and subarachnoid spaces.  And old right frontotemporal infarct is unchanged.  Stable old bilateral cerebellar infarcts and patchy white matter low density in the left cerebral hemisphere.  No intracranial hemorrhage, mass lesion or CT evidence of acute  infarction.  Unremarkable bones and included paranasal sinuses.  IMPRESSION:  1.  No acute abnormality. 2.  Stable atrophy, old infarcts and chronic small vessel white matter ischemic changes.  Medications:  I have reviewed the patient's current medications. She is currently NOT receiving PO medications until cleared by SLP. Scheduled:    . acetaminophen  650 mg Oral BID  . artificial tears  1 application Both Eyes QHS  . aspirin EC  81 mg Oral Daily  . citalopram  10 mg Oral Daily  . donepezil  10 mg Oral QHS  . enoxaparin (LOVENOX) injection  40 mg Subcutaneous Q24H  . furosemide  20 mg Intravenous Once  . levothyroxine  75 mcg Oral Daily  . losartan  50 mg Oral Daily  . nitrofurantoin  100 mg Oral Daily  . pantoprazole  40 mg Oral Q1200  . senna  2 tablet Oral QHS  . sodium chloride  3 mL Intravenous Q12H  . sodium chloride  3 mL Intravenous Q12H  . tiotropium  18 mcg Inhalation Daily  . warfarin  2.5 mg Intravenous Once  . Warfarin - Pharmacist Dosing Inpatient   Does not apply q1800  . DISCONTD: furosemide  20 mg Oral BID  . DISCONTD: warfarin  2.5 mg Oral Once   Continuous:  OZH:YQMVHQ chloride, albuterol, hydrALAZINE, ipratropium, sodium chloride, traMADol, DISCONTD: ipratropium-albuterol  Assessment/Plan: 78 yo F with complex PMH presenting for altered mental status   # AMS- Could be acute stroke vs.TIA vs.  Infection. Her symptoms appear to have resolved, and she is back to her baseline per daughter. CT does not show an acute process and patient is unable to have MRI due to pacemaker. UA was unremarkable, but urine culture was sent. Given the quick improvement of symptoms, this was most likely a TIA. - Continue to monitor on telemetry - Sitter was ordered since patient has dementia at baseline, and could use frequent re-direction if she is confused but this did not happen at admission. - TSH and A1c pending.  - Do not feel that echo or carotid would be beneficial in her  work up at this time, and I am not calling a neurology consult given the improvement in her symptoms.  - NPO until she passes swallow eval. PT/OT/SLP consults for the AM  - Add ASA 81mg , although daughter states she has had GI bleeding in the past and this was d/c - Patient had similar presentation with UTI in past; awaiting urine culture - Afebrile with normal WBC. If she spikes a temp, will get blood cultures and start empiric antibiotics   # HTN- BP elevated.  - Resume home Lasix and Cozaar, crushed. (When cleared by SLP)  - Will add prn Hydralazine 5mg  for SBP >200 or DBP >120   # Dementia- known vascular dementia. Unable to get MMSE due to AMS  - Continue Aricept and Celexa (home medications) when able to take PO - Sitter at bedside  - Will avoid sedating medications, if possible, given AMS   # Hypothyroidism-  - Continue home Synthroid dose  - TSH pending  # Afib- Did not appear to be in Afib on my exam. EKG shows ventricular paced. Currently INR is subtherapeutic.  - Continue Coumadin per pharmacy. (Got 2.5mg  IV last night) - Monitor on telemetry   # COPD- extensive smoking history, per chart records  - Spiriva daily  - DuoNeb prn for wheezing and shortness of breath   # FEN/GI- NPO until passes swallow screen. Protonix 40mg . Senna for constipation, but can add Miralax if needed  # PPx- Coumadin per pharmacy. Lovenox 40mg  daily for ppx until therapeutic # Dispo- Patient appears to be back to baseline, per daughter. Awaiting PT/OT/SLP today. Will be returning to [facility], and CSW consult has been placed for that. Anticipate return to SNF in next 24 hours, if she remains stable.  Code: FULL CODE Contact: Patient's Daughter is her primary contact and HCPOA. Her name is [name]  Cell: [phone]  Home: [phone]  Work: [phone] (after 12:00 on 8/27)   LOS: 1 day   [name] 06/09/2012, 9:38 AM

## 2012-06-09 NOTE — H&P (Signed)
FMTS Attending Admission Note: Savannah Aversa MD [phone] pager office [phone] I  have seen and examined this patient, reviewed their chart. I have discussed this patient with the resident. I agree with the resident's findings, assessment and care plan. 

## 2012-06-09 NOTE — Evaluation (Signed)
Clinical/Bedside Swallow Evaluation Patient Details  Name: Savannah [name] [name] MRN: [id number] Date of Birth: 12-Aug-1923  Today's Date: 06/09/2012 Time: 4098-1191 SLP Time Calculation (min): 16 min  Past Medical History:  Past Medical History  Diagnosis Date  . Hypertension   . Hyperlipidemia   . Hypothyroidism   . Atrial fibrillation   . Pacemaker   . Shortness of breath   . CHF (congestive heart failure) 05/27/2006; 05/30/2006; 11/06/2008  . Vascular dementia     "due to CVA 06/08/2009"  . Stroke 03/06/2001; 05/30/2009    "light stroke"  . Stroke 06/08/2009    "probable small TIA"  . Stroke 01/06/2002  . Angina 12/22/2003  . Ventilator dependent 06/08/2009    respiratory failure  . H/O: rheumatic fever     "childhood"  . Coronary artery disease   . Left atrial enlargement   . H/O cardiomegaly     diastolic  . Pulmonary hypertension   . Anemia     "iron transfusions"  . Blood transfusion   . History of lower GI bleeding     "multiple; required blood transfusions"  . COPD (chronic obstructive pulmonary disease)   . GERD (gastroesophageal reflux disease)   . H/O hiatal hernia   . H/O chronic bronchitis   . Asthma   . Arthritis   . Anxiety   . Depression   . Dysphagia as late effect of stroke   . Autoimmune hepatitis 07/12/1999  . Bilateral renal cysts     "2 large on right off the lower pole"  . Chronic renal insufficiency   . Pleural effusion     "moderate right & small left"  . Bacterial pneumonia 11/06/2008; 10/23/2009; 03/30/2010  . Avascular necrosis of femoral head     right   Past Surgical History:  Past Surgical History  Procedure Date  . Insert / replace / remove pacemaker 09/11/2000  . Av node ablation "06/06/2005"  . Percutaneous tracheostomy 06/15/2009  . Electroencephalogram 01/28/2002; 11/26/2005; 10/23/2009  . Cardiac catheterization 06/14/1999; 12/26/2003; 05/30/2009  . Transcranial doppler & carotid ultrasound 11/28/2005  . Cardiac valve replacement 08/20/1999   "mitral valve replacement; [employer]"  . Cardiac valve replacement 06/05/2009     "redo sternotomy & aortic valve replacement; tissue valve"  . Cauterized cecal av malformation   . Abdominal hysterectomy 1960's  . Dental implants removed 03/04/2001    lower  . Knee arthroscopy 12/26/2000    left  . Knee arthroscopy 07/29/2001    right   HPI:  Pt is an 78 yo F with complex PMH including vascular dementia, HTN, Afib (s/p pacemaker on chronic coumadin), CHF, COPD and hypothyroidism who was brought to the ED from her rehab facility [facility]) for acute onset altered mental status. Level IV Caveat applies as patient is demented and has some degree of altered mental status from baseline, and she was alone during interview. The rest of the history was taken from ED provider and chart review. Patient was in her normal state of health until a few hours prior to arrival to the ED. Daughter told EDP that she had difficulty speaking, drooling from the mouth and questionable increased weakness. Patient was brought to ED where she had labs (CBC, Cmet and UA unremarkable) as well as a head CT which did not show any acute abnormality. Throughout her time in the ED, patient's mental status improved greatly. She began communicating and answering questions, but daughter told EDP that she was still not to her baseline.  Assessment / Plan / Recommendation Clinical Impression  Pt with improved MS since admission, now purportedly back to baseline.  Hx of dysphagia, however, pt with overall functional swallow: demonstrates adequate bolus recognition/attention, prolonged but sufficient mastication, adequate hyolaryngeal elevation per palpation.  No s/s of compromised airway protection.  Pt was just upgraded to a mechanical soft diet at facility according to her daughter.  Recommend resuming this diet, thin liquids, crush meds with puree and FULL supervision with meals due to impulsivity/safety issues (confirmed by dtr.)    No SLP f/u warranted - will sign-off.          Diet Recommendation Dysphagia 3 (Mechanical Soft);Thin liquid   Liquid Administration via: Cup;Straw Medication Administration: crush meds with puree Supervision: Full supervision/cueing for compensatory strategies Postural Changes and/or Swallow Maneuvers: Seated upright 90 degrees;Upright 30-60 min after meal    Manual Navarra L. Samson Frederic, [place] CCC/SLP Pager [phone]   [name] 06/09/2012,11:25 AM

## 2012-06-09 NOTE — ED Provider Notes (Signed)
Altered mental status. Nonfocal exam. No clear cause the altered mental status and patient improved somewhat but not to baseline. She'll be admitted to medicine.I saw and evaluated the patient, reviewed the resident's note and I agree with the findings and plan.  Juliet Rude. Rubin Payor, MD 06/09/12 [phone]

## 2012-06-09 NOTE — Discharge Planning (Deleted)
Physician Discharge Summary  Patient ID: [name] MRN: [id number] DOB/AGE: 03/01/1923 78 y.o.  Admit date: 06/08/2012 Discharge date: 06/09/2012  Admission Diagnoses:  Discharge Diagnoses:  Principal Problem:  *Altered mental status Active Problems:  GERD (gastroesophageal reflux disease)  COPD (chronic obstructive pulmonary disease)  Pulmonary hypertension  Vascular dementia  Dysphagia as late effect of cerebrovascular disease   Discharged Condition: stable  Hospital Course: 78 yo F with complex PMHx admitted for altered mental status.  Initial differential diagnosis included stroke vs TIA vs infection.  Symptoms resolved spontaneously without any form of treatment.  CT head was negative for acute changes.  UA was negative for infection.  Urine culture was pending.  Otherwise, patient's home medications were continued with the exception of her UTI prophylaxis.  This was changed to Keflex 500mg daily for better coverage.  Patient was re-evaluated by speech, who recommended continuing her current dysphagia 3 diet (mechanical soft, thin liquid with full supervision).  Patient was started on Aspirin 81mg since this may have been a TIA and patient at high risk for CVA/TIA with her significant medical problems, including h/o stroke, a fib, HTN.  Patient's coumadin level was found to be subtherapeutic at 1.3.  Dose was increased from 2.5mg daily to 2.5mg on Sun-Mon-Wed-Thurs-Sat; 5mg on Tues-Friday. INR should be rechecked in 1 week.  Consults: PT, OT, speech therapy  Significant Diagnostic Studies: radiology: CXR: normal and CT scan of head: no acute abnormality, Stable atrophy, old infarcts and chronic small vessel white matter ischemic changes.   Treatments: IV hydration   Disposition: 03-Skilled Nursing Facility  Discharge Orders    Future Orders Please Complete By Expires   Diet - low sodium heart healthy      Increase activity slowly        Medication List  As of 06/09/2012  12:21 PM   STOP taking these medications         nitrofurantoin 100 MG capsule         TAKE these medications         acetaminophen 325 MG tablet   Commonly known as: TYLENOL   Take 650 mg by mouth 2 (two) times daily.      artificial tears Oint ophthalmic ointment   Place 1 application into both eyes at bedtime.      aspirin 81 MG EC tablet   Take 1 tablet (81 mg total) by mouth daily.      cephALEXin 500 MG capsule   Commonly known as: KEFLEX   Take 1 capsule (500 mg total) by mouth daily. For UTI prophylaxis      citalopram 10 MG tablet   Commonly known as: CELEXA   Take 10 mg by mouth daily.      donepezil 10 MG tablet   Commonly known as: ARICEPT   Take 10 mg by mouth at bedtime.      furosemide 20 MG tablet   Commonly known as: LASIX   Take 20 mg by mouth 2 (two) times daily.      ipratropium-albuterol 0.5-2.5 (3) MG/3ML Soln   Commonly known as: DUONEB   Take 3 mLs by nebulization every 6 (six) hours as needed. For shortness of breath      levothyroxine 75 MCG tablet   Commonly known as: SYNTHROID, LEVOTHROID   Take 75 mcg by mouth daily.      losartan 50 MG tablet   Commonly known as: COZAAR   Take 50 mg by mouth daily.        omeprazole 20 MG capsule   Commonly known as: PRILOSEC   Take 20 mg by mouth daily.      oyster calcium 500 MG Tabs   Take 500 mg of elemental calcium by mouth 3 (three) times daily.      senna 8.6 MG Tabs   Commonly known as: SENOKOT   Take 2 tablets by mouth at bedtime.      tiotropium 18 MCG inhalation capsule   Commonly known as: SPIRIVA   Place 18 mcg into inhaler and inhale daily.      traMADol 50 MG tablet   Commonly known as: ULTRAM   Take 25 mg by mouth 2 (two) times daily as needed. For pain      warfarin 5 MG tablet   Commonly known as: COUMADIN   2.5mg on Sun-Mon-Wed-Thurs-Sat; 5mg on Tues-Friday           Issues to follow up on: 1. Urine culture: NGTD at discharge, but not yet final; antibiotics were  NOT started 2. UTI prophylaxis: changed from nitrofurantoin to Keflex 500mg daily 3. Aspirin was added for CVA/TIA prevention 4. Coumadin dose was increased from 2.5mg daily to 2.5mg 5 days/week, 5mg 2 days/week as above due to subtherapeutic INR on admission.  INR should be rechecked in 1 week to ensure it is not supratherapeutic   Signed: Kayshawn Ozburn 06/09/2012, 12:21 PM    

## 2012-06-09 NOTE — Progress Notes (Signed)
FMTS Attending Daily Note: Joi Leyva MD [phone] pager office [phone] I  have seen and examined this patient, reviewed their chart. I have discussed this patient with the resident. I agree with the resident's findings, assessment and care plan. 

## 2012-06-09 NOTE — Progress Notes (Signed)
OT Note:  Noted pt is from [facility] and needs [facility].  Will sign off.  [place], [place] [phone] 06/09/2012

## 2012-06-09 NOTE — Progress Notes (Cosign Needed)
PT/OT Cancellation and Discharge Note  Pt resident of [facility] and per Nsg staff at [facility] pt was [facility] for transfers, bathing/dressing, toileting, and was currently part of their Restorative Feeding Program working with pt to try to feed herself again.  No skilled PT/OT needs at this time.  Pt would benefit from return to [facility] at D/C.  Will sign off  Sunny Schlein, [place] [phone] 06/09/2012, 10:47 AM

## 2012-06-09 NOTE — Progress Notes (Signed)
Clinical Social Work Department BRIEF PSYCHOSOCIAL ASSESSMENT 06/09/2012  Patient:  [name], [name]     Account Number:  [id number]     Admit date:  06/08/2012  Clinical Social Worker:  Lourdes Sledge  Date/Time:  06/09/2012 10:38 AM  Referred by:  Physician  Date Referred:  06/09/2012 Referred for  SNF Placement   Other Referral:   Interview type:  Family Other interview type:   CSW completed assessment with pt daughter Ertha Nabor [phone] by phone, per daughter request.    PSYCHOSOCIAL DATA Living Status:  FACILITY Admitted from facility:  [facility] Level of care:  Skilled Nursing Facility Primary support name:  Euleta Belson [phone] Primary support relationship to patient:  CHILD, ADULT Degree of support available:   Pt is a LT resident of [facility]. CSW informed pt daughter that she is actively involved in pt care.    CURRENT CONCERNS Current Concerns  Post-Acute Placement   Other Concerns:    SOCIAL WORK ASSESSMENT / PLAN CSW informed by CSW colleague that pt daughter would like this CSW to contact her.    CSW contacted pt daughter Donika Butner [phone] by phone and explored pt current living situation. CSW informed that pt who presents with dementia is a LT resident of [facility] and the plan will be for pt to return when medically stable. Ms. Theodore Rahrig requested that CSW inform FMTS to contact her with pt updates.    CSW contacted facility and confirmed that pt is using her Medicaid to pay for the facility and that pt is okay to return when medically stable. CSW will complete FL2 and facilitate with dc when pt becomes stable for dc.   Assessment/plan status:  Psychosocial Support/Ongoing Assessment of Needs Other assessment/ plan:   Information/referral to community resources:   CSW made aware that pt is from [facility] and the plan is for her to return when stable. Daughter not desiring a new SNF search, no resources were requested or  needed for this admission.    PATIENT'S/FAMILY'S RESPONSE TO PLAN OF CARE: Pt presents with dementia therefore CSW completed the assessment with daughter Joanna Borawski [phone]. CSW to facilitate with dc when pt becomes stable.    Theresia Bough, MSW, Theresia Majors [phone]

## 2012-06-09 NOTE — Progress Notes (Signed)
Clinical Child psychotherapist (CSW) informed that pt ready for dc back to [facility]. CSW faxed pt dc summary to [facility] and confirmed with facility that pt okay to return today. CSW spoke to pt daughter Ms. Sibyl Parr who is aware and agreeable of dc. CSW prepared and placed dc packet in pt shadow chart, provided facility contact information to give report and contacted [facility] for a non emergency ambulance to transport pt at 15:15 today. No further needs CSW signing off.  Theresia Bough, MSW, Theresia Majors [phone]

## 2012-06-09 NOTE — Progress Notes (Signed)
ANTICOAGULATION CONSULT NOTE - Follow Up Consult  Pharmacy Consult for Coumadin Indication: afib, CVA  Allergies  Allergen Reactions  . Advil (Ibuprofen) Other (See Comments)    "think it just doesn't work for her"  . Alprazolam Other (See Comments)    "don't remember what happens"  . Relafen (Nabumetone) Other (See Comments)    "don't remember what happens"  . Sulfa Antibiotics Other (See Comments)    "don't remember what happens"  . Toprol Xl (Metoprolol Succinate) Other (See Comments)    25mg ; "don't remember what happens"   Vital Signs: Temp: 98 F (36.7 C) (08/27 0800) Temp src: Axillary (08/27 0800) BP: 172/93 mmHg (08/27 0800) Pulse Rate: 62  (08/27 0800)  Labs:  Basename 06/09/12 0558 06/08/12 1853  HGB 10.7* 10.9*  HCT 32.3* 33.3*  PLT 174 154  APTT -- --  LABPROT 18.6* 18.0*  INR 1.52* 1.46  HEPARINUNFRC -- --  CREATININE 0.99 0.93  CKTOTAL -- --  CKMB -- --  TROPONINI -- --   Assessment: 89yof continues on coumadin with a subtherapeutic INR despite receiving an extra 2.5mg  last night. Hgb is low but stable, plts ok. No bleeding noted. Will give a higher dose again tonight.  Goal of Therapy:  INR 2-3 Monitor platelets by anticoagulation protocol: Yes   Plan:  1) Coumadin 5mg  x 1 tonight - will give IV as patient is currently NPO 2) Follow up INR in AM  Fredrik Rigger 06/09/2012,10:00 AM

## 2012-06-09 NOTE — Progress Notes (Signed)
Spoke with patient's daughter and updated on discharge instructions and instructions faxed to her, along with copy sent in envelope to facility for daughter.  Report called to P[facility] nurse at [facility].  Awaiting PTAR.  Colman Cater

## 2012-06-10 LAB — URINE CULTURE
Colony Count: NO GROWTH
Culture: NO GROWTH

## 2012-06-11 NOTE — Discharge Summary (Signed)
Physician Discharge Summary  Patient ID: [name] MRN: [id number] DOB/AGE: [birth year] 76 y.o.  Admit date: 06/08/2012 Discharge date: 06/09/2012  Admission Diagnoses:  Discharge Diagnoses:  Principal Problem:  *Altered mental status Active Problems:  GERD (gastroesophageal reflux disease)  COPD (chronic obstructive pulmonary disease)  Pulmonary hypertension  Vascular dementia  Dysphagia as late effect of cerebrovascular disease   Discharged Condition: stable  Hospital Course: 78 yo F with complex PMHx admitted for altered mental status.  Initial differential diagnosis included stroke vs TIA vs infection.  Symptoms resolved spontaneously without any form of treatment.  CT head was negative for acute changes.  UA was negative for infection.  Urine culture was pending.  Otherwise, patient's home medications were continued with the exception of her UTI prophylaxis.  This was changed to Keflex 500mg  daily for better coverage.  Patient was re-evaluated by speech, who recommended continuing her current dysphagia 3 diet (mechanical soft, thin liquid with full supervision).  Patient was started on Aspirin 81mg  since this may have been a TIA and patient at high risk for CVA/TIA with her significant medical problems, including h/o stroke, a fib, HTN.  Patient's coumadin level was found to be subtherapeutic at 1.3.  Dose was increased from 2.5mg  daily to 2.5mg  on Sun-Mon-Wed-Thurs-Sat; 5mg  on Tues-Friday. INR should be rechecked in 1 week.  Consults: PT, OT, speech therapy  Significant Diagnostic Studies: radiology: CXR: normal and CT scan of head: no acute abnormality, Stable atrophy, old infarcts and chronic small vessel white matter ischemic changes.   Treatments: IV hydration   Disposition: 03-Skilled Nursing Facility  Discharge Orders    Future Orders Please Complete By Expires   Diet - low sodium heart healthy      Increase activity slowly        Medication List  As of 06/09/2012  12:21 PM   STOP taking these medications         nitrofurantoin 100 MG capsule         TAKE these medications         acetaminophen 325 MG tablet   Commonly known as: TYLENOL   Take 650 mg by mouth 2 (two) times daily.      artificial tears Oint ophthalmic ointment   Place 1 application into both eyes at bedtime.      aspirin 81 MG EC tablet   Take 1 tablet (81 mg total) by mouth daily.      cephALEXin 500 MG capsule   Commonly known as: KEFLEX   Take 1 capsule (500 mg total) by mouth daily. For UTI prophylaxis      citalopram 10 MG tablet   Commonly known as: CELEXA   Take 10 mg by mouth daily.      donepezil 10 MG tablet   Commonly known as: ARICEPT   Take 10 mg by mouth at bedtime.      furosemide 20 MG tablet   Commonly known as: LASIX   Take 20 mg by mouth 2 (two) times daily.      ipratropium-albuterol 0.5-2.5 (3) MG/3ML Soln   Commonly known as: DUONEB   Take 3 mLs by nebulization every 6 (six) hours as needed. For shortness of breath      levothyroxine 75 MCG tablet   Commonly known as: SYNTHROID, LEVOTHROID   Take 75 mcg by mouth daily.      losartan 50 MG tablet   Commonly known as: COZAAR   Take 50 mg by mouth daily.  omeprazole 20 MG capsule   Commonly known as: PRILOSEC   Take 20 mg by mouth daily.      oyster calcium 500 MG Tabs   Take 500 mg of elemental calcium by mouth 3 (three) times daily.      senna 8.6 MG Tabs   Commonly known as: SENOKOT   Take 2 tablets by mouth at bedtime.      tiotropium 18 MCG inhalation capsule   Commonly known as: SPIRIVA   Place 18 mcg into inhaler and inhale daily.      traMADol 50 MG tablet   Commonly known as: ULTRAM   Take 25 mg by mouth 2 (two) times daily as needed. For pain      warfarin 5 MG tablet   Commonly known as: COUMADIN   2.5mg  on Sun-Mon-Wed-Thurs-Sat; 5mg  on Tues-Friday           Issues to follow up on: 1. Urine culture: NGTD at discharge, but not yet final; antibiotics were  NOT started 2. UTI prophylaxis: changed from nitrofurantoin to Keflex 500mg  daily 3. Aspirin was added for CVA/TIA prevention 4. Coumadin dose was increased from 2.5mg  daily to 2.5mg  5 days/week, 5mg  2 days/week as above due to subtherapeutic INR on admission.  INR should be rechecked in 1 week to ensure it is not supratherapeutic   Signed: Kelci Petrella 06/09/2012, 12:21 PM

## 2012-06-12 NOTE — Discharge Summary (Signed)
Family Medicine Teaching Service  Discharge Note : Attending Naila Elizondo MD Pager [phone] Office [phone] I have seen and examined this patient, reviewed their chart and discussed discharge planning wit the resident at the time of discharge. I agree with the discharge plan as above.  

## 2012-12-02 ENCOUNTER — Encounter: Payer: Self-pay | Admitting: Internal Medicine

## 2013-01-04 ENCOUNTER — Non-Acute Institutional Stay (SKILLED_NURSING_FACILITY): Payer: Medicare Other | Admitting: Nurse Practitioner

## 2013-01-04 ENCOUNTER — Encounter: Payer: Self-pay | Admitting: Nurse Practitioner

## 2013-01-04 DIAGNOSIS — Z954 Presence of other heart-valve replacement: Secondary | ICD-10-CM

## 2013-01-04 DIAGNOSIS — I6350 Cerebral infarction due to unspecified occlusion or stenosis of unspecified cerebral artery: Secondary | ICD-10-CM

## 2013-01-04 DIAGNOSIS — I2782 Chronic pulmonary embolism: Secondary | ICD-10-CM

## 2013-01-04 DIAGNOSIS — Z7901 Long term (current) use of anticoagulants: Secondary | ICD-10-CM

## 2013-01-04 DIAGNOSIS — I639 Cerebral infarction, unspecified: Secondary | ICD-10-CM | POA: Insufficient documentation

## 2013-01-04 DIAGNOSIS — I4891 Unspecified atrial fibrillation: Secondary | ICD-10-CM | POA: Insufficient documentation

## 2013-01-04 HISTORY — DX: Unspecified atrial fibrillation: I48.91

## 2013-01-04 NOTE — Progress Notes (Signed)
Patient ID: [name], female   DOB: 1923-03-17, 79 y.o.   MRN: [id number] Subjective:     Indication: atrial fibrillation Bleeding signs/symptoms: None Thromboembolic signs/symptoms: None  Missed Coumadin doses: None Medication changes: no Dietary changes: no Bacterial/viral infection: no Other concerns: no  The following portions of the patient's history were reviewed and updated as appropriate: allergies, current medications, past family history, past medical history, past social history, past surgical history and problem list. Review of Systems A comprehensive review of systems was negative.   Objective:    INR Today: 4.2 Current dose: 5mg   Assessment:    Supratherapeutic INR for goal of 2.5-3.5   Plan:    1. New dose: Hold   2. Next INR: recheck inr on Wednesday

## 2013-01-04 NOTE — Progress Notes (Signed)
This encounter was created in error - please disregard.  This encounter was created in error - please disregard.

## 2013-01-06 ENCOUNTER — Encounter: Payer: Self-pay | Admitting: Nurse Practitioner

## 2013-01-06 ENCOUNTER — Non-Acute Institutional Stay (SKILLED_NURSING_FACILITY): Payer: Medicare Other | Admitting: Nurse Practitioner

## 2013-01-06 DIAGNOSIS — Z7901 Long term (current) use of anticoagulants: Secondary | ICD-10-CM

## 2013-01-06 DIAGNOSIS — I4891 Unspecified atrial fibrillation: Secondary | ICD-10-CM

## 2013-01-06 DIAGNOSIS — I6350 Cerebral infarction due to unspecified occlusion or stenosis of unspecified cerebral artery: Secondary | ICD-10-CM

## 2013-01-06 DIAGNOSIS — I639 Cerebral infarction, unspecified: Secondary | ICD-10-CM

## 2013-01-06 DIAGNOSIS — Z954 Presence of other heart-valve replacement: Secondary | ICD-10-CM

## 2013-01-06 MED ORDER — WARFARIN SODIUM 5 MG PO TABS: 5.0000 mg | ORAL_TABLET | Freq: Every day | ORAL | Status: DC

## 2013-01-06 NOTE — Progress Notes (Signed)
Patient ID: [name], female   DOB: 07-18-1923, 79 y.o.   MRN: [id number] Subjective:     Indication: atrial fibrillation Bleeding signs/symptoms: None Thromboembolic signs/symptoms: None  Missed Coumadin doses: dose held Medication changes: no Dietary changes: no Bacterial/viral infection: no Other concerns: no  The following portions of the patient's history were reviewed and updated as appropriate: allergies, current medications, past family history, past medical history, past social history, past surgical history and problem list.  Review of Systems A comprehensive review of systems was negative.   Objective:    INR Today: 2.4 Current dose: Held  Assessment:    Subtherapeutic INR for goal of 2.5-3.5   Plan:    1. New dose: Restart 5 mg po qd   2. Next INR: Recheck inr on Friday

## 2013-01-08 ENCOUNTER — Non-Acute Institutional Stay (SKILLED_NURSING_FACILITY): Payer: Medicare Other | Admitting: Nurse Practitioner

## 2013-01-08 DIAGNOSIS — Z7901 Long term (current) use of anticoagulants: Secondary | ICD-10-CM

## 2013-01-08 DIAGNOSIS — I4891 Unspecified atrial fibrillation: Secondary | ICD-10-CM

## 2013-01-08 NOTE — Progress Notes (Signed)
Patient ID: [name], female   DOB: 06-08-1923, 79 y.o.   MRN: [id number] Subjective:     Indication: aortic valve regurgitation and atrial fibrillation Bleeding signs/symptoms: None Thromboembolic signs/symptoms: None  Missed Coumadin doses: None Medication changes: no Dietary changes: no Bacterial/viral infection: no Other concerns: no  The following portions of the patient's history were reviewed and updated as appropriate: allergies, current medications, past family history, past medical history, past social history, past surgical history and problem list.  Review of Systems A comprehensive review of systems was negative.   Objective:    INR Today: 2.0 Current dose: 5 mg  Assessment:    Subtherapeutic INR for goal of 2.5-3.5   Plan:    1. New dose: increase to 6mg    2. Next INR: 01/11/13

## 2013-01-10 ENCOUNTER — Encounter: Payer: Self-pay | Admitting: Nurse Practitioner

## 2013-01-10 MED ORDER — WARFARIN SODIUM 6 MG PO TABS: 6.0000 mg | ORAL_TABLET | Freq: Every day | ORAL | Status: DC

## 2013-01-12 ENCOUNTER — Non-Acute Institutional Stay (SKILLED_NURSING_FACILITY): Payer: Medicare Other | Admitting: Nurse Practitioner

## 2013-01-12 DIAGNOSIS — Z7901 Long term (current) use of anticoagulants: Secondary | ICD-10-CM

## 2013-01-12 DIAGNOSIS — I4891 Unspecified atrial fibrillation: Secondary | ICD-10-CM

## 2013-01-12 DIAGNOSIS — Z954 Presence of other heart-valve replacement: Secondary | ICD-10-CM

## 2013-01-12 DIAGNOSIS — I6350 Cerebral infarction due to unspecified occlusion or stenosis of unspecified cerebral artery: Secondary | ICD-10-CM

## 2013-01-12 DIAGNOSIS — I639 Cerebral infarction, unspecified: Secondary | ICD-10-CM

## 2013-01-13 ENCOUNTER — Encounter: Payer: Self-pay | Admitting: Nurse Practitioner

## 2013-01-13 NOTE — Progress Notes (Signed)
Patient ID: [name], female   DOB: 1923-04-03, 79 y.o.   MRN: [id number] Subjective:     Indication: Afib, CVA,  Mitral valve and Aortic valve replacement Bleeding signs/symptoms: None Thromboembolic signs/symptoms: None  Missed Coumadin doses: None Medication changes: no Dietary changes: no Bacterial/viral infection: no Other concerns: no  The following portions of the patient's history were reviewed and updated as appropriate: allergies, current medications, past family history, past medical history, past social history, past surgical history and problem list.  Review of Systems A comprehensive review of systems was negative.   Objective:    INR Today: 3.1 Current dose: 6mg   Assessment:    Therapeutic INR for goal of 2.5-3.5   Plan:    1. New dose: no change   2. Next INR: 01/14/13

## 2013-01-14 ENCOUNTER — Non-Acute Institutional Stay (SKILLED_NURSING_FACILITY): Payer: Medicare Other | Admitting: Nurse Practitioner

## 2013-01-14 DIAGNOSIS — Z7901 Long term (current) use of anticoagulants: Secondary | ICD-10-CM

## 2013-01-14 DIAGNOSIS — I6350 Cerebral infarction due to unspecified occlusion or stenosis of unspecified cerebral artery: Secondary | ICD-10-CM

## 2013-01-14 DIAGNOSIS — I639 Cerebral infarction, unspecified: Secondary | ICD-10-CM

## 2013-01-14 DIAGNOSIS — I4891 Unspecified atrial fibrillation: Secondary | ICD-10-CM

## 2013-01-14 DIAGNOSIS — Z954 Presence of other heart-valve replacement: Secondary | ICD-10-CM

## 2013-01-15 ENCOUNTER — Encounter: Payer: Self-pay | Admitting: Nurse Practitioner

## 2013-01-15 NOTE — Progress Notes (Signed)
Patient ID: SHAREL BEHNE, female   DOB: [birth year], 79 y.o.   MRN: [id number] Subjective:     Indication: atrial fibrillation, CVA and AV and MV replacement Bleeding signs/symptoms: None Thromboembolic signs/symptoms: None  Missed Coumadin doses: None Medication changes: no Dietary changes: no Bacterial/viral infection: no Other concerns: no  The following portions of the patient's history were reviewed and updated as appropriate: allergies, current medications, past family history, past medical history, past social history, past surgical history and problem list.  Review of Systems A comprehensive review of systems was negative.   Objective:    INR Today: 3.1 Current dose: 6 mg  Assessment:    Therapeutic INR for goal of 2.5-3.5   Plan:    1. New dose: no change   2. Next INR: 01/18/13

## 2013-01-18 ENCOUNTER — Encounter: Payer: Self-pay | Admitting: Nurse Practitioner

## 2013-01-18 ENCOUNTER — Non-Acute Institutional Stay (SKILLED_NURSING_FACILITY): Payer: Medicare Other | Admitting: Nurse Practitioner

## 2013-01-18 DIAGNOSIS — I4891 Unspecified atrial fibrillation: Secondary | ICD-10-CM

## 2013-01-18 DIAGNOSIS — I6350 Cerebral infarction due to unspecified occlusion or stenosis of unspecified cerebral artery: Secondary | ICD-10-CM

## 2013-01-18 DIAGNOSIS — I639 Cerebral infarction, unspecified: Secondary | ICD-10-CM

## 2013-01-18 DIAGNOSIS — Z7901 Long term (current) use of anticoagulants: Secondary | ICD-10-CM

## 2013-01-18 DIAGNOSIS — Z954 Presence of other heart-valve replacement: Secondary | ICD-10-CM

## 2013-01-18 NOTE — Progress Notes (Signed)
Patient ID: [name], female   DOB: May 06, 1923, 79 y.o.   MRN: [id number] Subjective:     Indication: atrial fibrillation, CVA, AV and MV replacement Bleeding signs/symptoms: None Thromboembolic signs/symptoms: None  Missed Coumadin doses: None Medication changes: no Dietary changes: no Bacterial/viral infection: no Other concerns: no  The following portions of the patient's history were reviewed and updated as appropriate: allergies, current medications, past family history, past medical history, past social history, past surgical history and problem list.  Review of Systems A comprehensive review of systems was negative.   Objective:    INR Today: 3.9 Current dose: 6mg   Assessment:    Supratherapeutic INR for goal of 2.5-3.5   Plan:    1. New dose: Hold   2. Next INR:  01/19/13

## 2013-01-19 ENCOUNTER — Encounter: Payer: Self-pay | Admitting: Nurse Practitioner

## 2013-01-19 ENCOUNTER — Non-Acute Institutional Stay (SKILLED_NURSING_FACILITY): Payer: Medicare Other | Admitting: Nurse Practitioner

## 2013-01-19 DIAGNOSIS — I639 Cerebral infarction, unspecified: Secondary | ICD-10-CM

## 2013-01-19 DIAGNOSIS — Z954 Presence of other heart-valve replacement: Secondary | ICD-10-CM

## 2013-01-19 DIAGNOSIS — Z7901 Long term (current) use of anticoagulants: Secondary | ICD-10-CM

## 2013-01-19 DIAGNOSIS — I4891 Unspecified atrial fibrillation: Secondary | ICD-10-CM

## 2013-01-19 DIAGNOSIS — I6350 Cerebral infarction due to unspecified occlusion or stenosis of unspecified cerebral artery: Secondary | ICD-10-CM

## 2013-01-19 MED ORDER — WARFARIN SODIUM 5 MG PO TABS: 5.0000 mg | ORAL_TABLET | Freq: Every day | ORAL | Status: DC

## 2013-01-19 NOTE — Progress Notes (Signed)
Patient ID: [name], female   DOB: Apr 23, 1923, 79 y.o.   MRN: [id number] Subjective:     Indication: atrial fibrillation, CVA and AV and MV replacement Bleeding signs/symptoms: None Thromboembolic signs/symptoms: None  Missed Coumadin doses: None Medication changes: no Dietary changes: no Bacterial/viral infection: no Other concerns: no  The following portions of the patient's history were reviewed and updated as appropriate: allergies, current medications, past family history, past medical history, past social history, past surgical history and problem list.  Review of Systems A comprehensive review of systems was negative.   Objective:    INR Today: 3.4 Current dose: held yesterday  Assessment:    Therapeutic INR for goal of 2.5-3.5   Plan:    1. New dose: decrease coumadin to 5 mg po qd.   2. Next INR: 01/25/13

## 2013-01-25 ENCOUNTER — Non-Acute Institutional Stay (SKILLED_NURSING_FACILITY): Payer: Medicare Other | Admitting: Nurse Practitioner

## 2013-01-25 DIAGNOSIS — I639 Cerebral infarction, unspecified: Secondary | ICD-10-CM

## 2013-01-25 DIAGNOSIS — I6350 Cerebral infarction due to unspecified occlusion or stenosis of unspecified cerebral artery: Secondary | ICD-10-CM

## 2013-01-25 DIAGNOSIS — Z954 Presence of other heart-valve replacement: Secondary | ICD-10-CM

## 2013-01-25 DIAGNOSIS — Z7901 Long term (current) use of anticoagulants: Secondary | ICD-10-CM

## 2013-01-25 DIAGNOSIS — I4891 Unspecified atrial fibrillation: Secondary | ICD-10-CM

## 2013-01-28 ENCOUNTER — Non-Acute Institutional Stay (SKILLED_NURSING_FACILITY): Payer: Medicare Other | Admitting: Nurse Practitioner

## 2013-01-28 DIAGNOSIS — I2782 Chronic pulmonary embolism: Secondary | ICD-10-CM

## 2013-01-28 DIAGNOSIS — I4891 Unspecified atrial fibrillation: Secondary | ICD-10-CM

## 2013-01-28 DIAGNOSIS — Z954 Presence of other heart-valve replacement: Secondary | ICD-10-CM

## 2013-01-28 DIAGNOSIS — I639 Cerebral infarction, unspecified: Secondary | ICD-10-CM

## 2013-01-28 DIAGNOSIS — Z7901 Long term (current) use of anticoagulants: Secondary | ICD-10-CM

## 2013-01-28 DIAGNOSIS — I6350 Cerebral infarction due to unspecified occlusion or stenosis of unspecified cerebral artery: Secondary | ICD-10-CM

## 2013-02-01 ENCOUNTER — Non-Acute Institutional Stay (SKILLED_NURSING_FACILITY): Payer: Medicare Other | Admitting: Nurse Practitioner

## 2013-02-01 DIAGNOSIS — I6350 Cerebral infarction due to unspecified occlusion or stenosis of unspecified cerebral artery: Secondary | ICD-10-CM

## 2013-02-01 DIAGNOSIS — I4891 Unspecified atrial fibrillation: Secondary | ICD-10-CM

## 2013-02-01 DIAGNOSIS — Z954 Presence of other heart-valve replacement: Secondary | ICD-10-CM

## 2013-02-01 DIAGNOSIS — Z7901 Long term (current) use of anticoagulants: Secondary | ICD-10-CM

## 2013-02-01 DIAGNOSIS — I639 Cerebral infarction, unspecified: Secondary | ICD-10-CM

## 2013-02-01 DIAGNOSIS — I2782 Chronic pulmonary embolism: Secondary | ICD-10-CM

## 2013-02-03 ENCOUNTER — Non-Acute Institutional Stay (SKILLED_NURSING_FACILITY): Payer: Medicare Other | Admitting: Nurse Practitioner

## 2013-02-03 ENCOUNTER — Encounter: Payer: Self-pay | Admitting: Nurse Practitioner

## 2013-02-03 DIAGNOSIS — I6350 Cerebral infarction due to unspecified occlusion or stenosis of unspecified cerebral artery: Secondary | ICD-10-CM

## 2013-02-03 DIAGNOSIS — I639 Cerebral infarction, unspecified: Secondary | ICD-10-CM

## 2013-02-03 DIAGNOSIS — I2782 Chronic pulmonary embolism: Secondary | ICD-10-CM

## 2013-02-03 DIAGNOSIS — I4891 Unspecified atrial fibrillation: Secondary | ICD-10-CM

## 2013-02-03 DIAGNOSIS — Z7901 Long term (current) use of anticoagulants: Secondary | ICD-10-CM

## 2013-02-03 DIAGNOSIS — Z954 Presence of other heart-valve replacement: Secondary | ICD-10-CM

## 2013-02-03 MED ORDER — WARFARIN SODIUM 2 MG PO TABS: ORAL_TABLET | ORAL | Status: DC

## 2013-02-03 MED ORDER — WARFARIN SODIUM 5 MG PO TABS: ORAL_TABLET | ORAL | Status: DC

## 2013-02-03 NOTE — Progress Notes (Signed)
Patient ID: [name], female   DOB: 12-11-1922, 78 y.o.   MRN: [id number] Subjective:     Indication: atrial fibrillation, CVA and aortic and mitral valve replacement. Bleeding signs/symptoms: None Thromboembolic signs/symptoms: None  Missed Coumadin doses: one dose was missed on 01/27/13 per nursing MAR. Medication changes: no Dietary changes: no Bacterial/viral infection: no Other concerns: no  The following portions of the patient's history were reviewed and updated as appropriate: allergies, current medications, past family history, past medical history, past social history, past surgical history and problem list.  Review of Systems A comprehensive review of systems was negative.   Objective:    INR Today: 2.3 Current dose: 4mg  M-F, 5mg  SS  Assessment:    Subtherapeutic INR for goal of 2.5-3.5   Plan:    1. New dose: no change   2. Next INR: 02/03/13

## 2013-02-03 NOTE — Progress Notes (Signed)
Patient ID: [name], female   DOB: [birth year], 79 y.o.   MRN: [id number] Subjective:     Indication: atrial fibrillation, CVA and Aortic valve replacement and Mitral valve replacement Bleeding signs/symptoms: None Thromboembolic signs/symptoms: None  Missed Coumadin doses: None Medication changes: no Dietary changes: no Bacterial/viral infection: no Other concerns: no  The following portions of the patient's history were reviewed and updated as appropriate: allergies, current medications, past family history, past medical history, past social history, past surgical history and problem list.  Review of Systems A comprehensive review of systems was negative.   Objective:    INR Today: 4.2 Current dose:5mg   Assessment:    Supratherapeutic INR for goal of 2.5-3.5   Plan:    1. New dose: hold   2. Next MVH:[id number]

## 2013-02-03 NOTE — Progress Notes (Signed)
Patient ID: [name], female   DOB: 23-Apr-1923, 79 y.o.   MRN: [id number] Subjective:     Indication: atrial fibrillation, CVA, PE and Aortic and Mitral Valve replacement Bleeding signs/symptoms: None Thromboembolic signs/symptoms: None  Missed Coumadin doses: None Medication changes: no Dietary changes: no Bacterial/viral infection: no Other concerns: no  The following portions of the patient's history were reviewed and updated as appropriate: allergies, current medications, past family history, past medical history, past social history, past surgical history and problem list.  Review of Systems A comprehensive review of systems was negative.   Objective:    INR Today: 2.6 Current dose: 5mg  was held  Assessment:    Therapeutic INR for goal of 2-3   Plan:    1. New dose: 4mg  M-F, 5mg  Saturday and Sunday   2. Next INR: Monday

## 2013-02-03 NOTE — Progress Notes (Signed)
Patient ID: [name], female   DOB: May 17, 1923, 79 y.o.   MRN: [id number] Subjective:     Indication: atrial fibrillation, CVA and aortic and mitral valve replacement Bleeding signs/symptoms: None Thromboembolic signs/symptoms: None  Missed Coumadin doses: None Medication changes: no Dietary changes: no Bacterial/viral infection: no Other concerns: no  The following portions of the patient's history were reviewed and updated as appropriate: allergies, current medications, past family history, past medical history, past social history, past surgical history and problem list.  Review of Systems A comprehensive review of systems was negative.   Objective:    INR Today: 2.5 Current dose: 4mg  M-F, 5mg  S and S.  Assessment:    Therapeutic INR for goal of 2.5-3.5   Plan:    1. New dose: no change   2. Next INR: next Monday

## 2013-02-09 ENCOUNTER — Encounter: Payer: Self-pay | Admitting: Nurse Practitioner

## 2013-02-09 ENCOUNTER — Non-Acute Institutional Stay (SKILLED_NURSING_FACILITY): Payer: Medicare Other | Admitting: Nurse Practitioner

## 2013-02-09 ENCOUNTER — Ambulatory Visit (INDEPENDENT_AMBULATORY_CARE_PROVIDER_SITE_OTHER): Payer: Medicare Other | Admitting: Nurse Practitioner

## 2013-02-09 VITALS — BP 127/81 | HR 62

## 2013-02-09 DIAGNOSIS — I639 Cerebral infarction, unspecified: Secondary | ICD-10-CM

## 2013-02-09 DIAGNOSIS — I2782 Chronic pulmonary embolism: Secondary | ICD-10-CM

## 2013-02-09 DIAGNOSIS — I6350 Cerebral infarction due to unspecified occlusion or stenosis of unspecified cerebral artery: Secondary | ICD-10-CM

## 2013-02-09 DIAGNOSIS — F0150 Vascular dementia without behavioral disturbance: Secondary | ICD-10-CM | POA: Insufficient documentation

## 2013-02-09 DIAGNOSIS — Z954 Presence of other heart-valve replacement: Secondary | ICD-10-CM

## 2013-02-09 DIAGNOSIS — I4891 Unspecified atrial fibrillation: Secondary | ICD-10-CM

## 2013-02-09 DIAGNOSIS — Z7901 Long term (current) use of anticoagulants: Secondary | ICD-10-CM

## 2013-02-09 NOTE — Progress Notes (Signed)
HPI: Patient returns for followup after last visit 12/31/2011 with Dr. Terrace Arabia. Patient is drowsy but easy to arouse. The daughter thinks her memory is continuing to improve. Appetite is good she eats soft mechanical diet. She is total assist for all activities of daily living. She is currently at a skilled facility. There are no new neurologic complaints.  ROS:  - neg  Physical Exam General: well developed, well nourished, seated, in no evident distress Head: head normocephalic and atraumatic. Oropharynx benign Neck: supple with no carotid or supraclavicular bruits Cardiovascular:  no murmurs  Neurologic Exam Mental Status: Awake and  alert. Not oriented to place and time. Unable to perform MMSE. Speech is clear. Follows one-step commands. Cranial Nerves: Pupils irregular but reactive  Extraocular movements full without nystagmus. Visual fields full to confrontation. Hearing intact and symmetric to finger snap. Left facial droop, tongue, palate move normally and symmetrically.  Motor: Moderate left-sided weakness in the upper and lower extremity, increased tone in the left side with spasticity, tolerates passive movements. Good strength on the right Sensory.: Withdraws to pain Coordination: Unable to perform  Gait and Station: Wheelchair-bound   Reflexes: 2+ and symmetric on the right, brisker on the left. Toes downgoing.     ASSESSMENT: Elderly female with multiple medical problems including artificial valve with chronic anti-coagulation,on Jantoven. Suffered a large right MCA stroke August 2010 after aortic valve replacement thought cardioembolic due to atrial fib. Vascular dementia currently on Aricept. Patient is nonambulatory     PLAN: Continue Aricept at current dose Continue her Sophronia Simas for secondary stroke prevention and anti-coagulation. Followup yearly and when necessary as needed.  Nilda Riggs, GNP-BC APRN

## 2013-02-09 NOTE — Progress Notes (Signed)
Patient ID: [name], female   DOB: 1923-07-16, 79 y.o.   MRN: [id number] Subjective:     Indication: atrial fibrillation, CVA and Aortic valve and Mitral valve replacement. Bleeding signs/symptoms: None Thromboembolic signs/symptoms: None  Missed Coumadin doses: None Medication changes: no Dietary changes: no Bacterial/viral infection: no Other concerns: no  The following portions of the patient's history were reviewed and updated as appropriate: allergies, current medications, past family history, past medical history, past social history, past surgical history and problem list.  Review of Systems A comprehensive review of systems was negative.   Objective:    INR Today: 2.5 Current dose: 4mg  M-F, 5mg  Sat and Sun.  Assessment:    Therapeutic INR for goal of 2.5-3.5   Plan:    1. New dose: no change   2. Next INR: 02/15/13

## 2013-02-09 NOTE — Patient Instructions (Addendum)
Instructions per nursing home form

## 2013-02-17 ENCOUNTER — Encounter: Payer: Self-pay | Admitting: Nurse Practitioner

## 2013-02-18 ENCOUNTER — Encounter: Payer: Self-pay | Admitting: Nurse Practitioner

## 2013-02-18 NOTE — Progress Notes (Signed)
Patient ID: [name], female   DOB: June 09, 1923, 79 y.o.   MRN: [id number]  This encounter was created in error - please disregard.

## 2013-02-18 NOTE — Progress Notes (Deleted)
  Subjective:    Patient ID: [name], female    DOB: [birth year], 79 y.o.   MRN: [id number]  HPI    Review of Systems     Objective:   Physical Exam        Assessment & Plan:

## 2013-02-22 ENCOUNTER — Encounter: Payer: Self-pay | Admitting: Nurse Practitioner

## 2013-03-01 ENCOUNTER — Encounter: Payer: Self-pay | Admitting: Nurse Practitioner

## 2013-03-09 ENCOUNTER — Non-Acute Institutional Stay (SKILLED_NURSING_FACILITY): Payer: Medicare Other | Admitting: Nurse Practitioner

## 2013-03-09 DIAGNOSIS — I4891 Unspecified atrial fibrillation: Secondary | ICD-10-CM

## 2013-03-09 DIAGNOSIS — I639 Cerebral infarction, unspecified: Secondary | ICD-10-CM

## 2013-03-09 DIAGNOSIS — I2782 Chronic pulmonary embolism: Secondary | ICD-10-CM

## 2013-03-09 DIAGNOSIS — Z7901 Long term (current) use of anticoagulants: Secondary | ICD-10-CM

## 2013-03-09 DIAGNOSIS — I6350 Cerebral infarction due to unspecified occlusion or stenosis of unspecified cerebral artery: Secondary | ICD-10-CM

## 2013-03-15 MED ORDER — WARFARIN SODIUM 4 MG PO TABS: 4.0000 mg | ORAL_TABLET | Freq: Every day | ORAL | Status: DC

## 2013-03-15 NOTE — Progress Notes (Signed)
Patient ID: [name], female   DOB: [birth year], 79 y.o.   MRN: [id number] Subjective:     Indication: atrial fibrillation, CVA and Mitral valve and Aortic valve replacement Bleeding signs/symptoms: None Thromboembolic signs/symptoms: None  Missed Coumadin doses: None Medication changes: no Dietary changes: no Bacterial/viral infection: no Other concerns: no  The following portions of the patient's history were reviewed and updated as appropriate: allergies, current medications, past family history, past medical history, past social history, past surgical history and problem list.  Review of Systems A comprehensive review of systems was negative.   Objective:    INR Today: 3.3 Current dose: 4mg   Assessment:    Therapeutic INR for goal of 2.5-3.5   Plan:    1. New dose: no change   2. Next INR: 1 week

## 2013-05-05 ENCOUNTER — Non-Acute Institutional Stay (SKILLED_NURSING_FACILITY): Payer: Medicare Other | Admitting: Adult Health

## 2013-05-05 DIAGNOSIS — F0150 Vascular dementia without behavioral disturbance: Secondary | ICD-10-CM

## 2013-05-05 DIAGNOSIS — I10 Essential (primary) hypertension: Secondary | ICD-10-CM

## 2013-05-05 DIAGNOSIS — M1990 Unspecified osteoarthritis, unspecified site: Secondary | ICD-10-CM

## 2013-05-05 DIAGNOSIS — F329 Major depressive disorder, single episode, unspecified: Secondary | ICD-10-CM

## 2013-05-05 DIAGNOSIS — I4891 Unspecified atrial fibrillation: Secondary | ICD-10-CM

## 2013-05-05 DIAGNOSIS — M129 Arthropathy, unspecified: Secondary | ICD-10-CM

## 2013-05-05 DIAGNOSIS — J449 Chronic obstructive pulmonary disease, unspecified: Secondary | ICD-10-CM

## 2013-05-05 DIAGNOSIS — E039 Hypothyroidism, unspecified: Secondary | ICD-10-CM

## 2013-05-05 DIAGNOSIS — K5900 Constipation, unspecified: Secondary | ICD-10-CM

## 2013-05-05 DIAGNOSIS — R609 Edema, unspecified: Secondary | ICD-10-CM

## 2013-05-05 DIAGNOSIS — Z7901 Long term (current) use of anticoagulants: Secondary | ICD-10-CM

## 2013-05-05 DIAGNOSIS — K219 Gastro-esophageal reflux disease without esophagitis: Secondary | ICD-10-CM

## 2013-05-07 ENCOUNTER — Non-Acute Institutional Stay (SKILLED_NURSING_FACILITY): Payer: Medicare Other | Admitting: Adult Health

## 2013-05-07 DIAGNOSIS — I4891 Unspecified atrial fibrillation: Secondary | ICD-10-CM

## 2013-05-07 DIAGNOSIS — Z7901 Long term (current) use of anticoagulants: Secondary | ICD-10-CM

## 2013-05-12 ENCOUNTER — Non-Acute Institutional Stay (SKILLED_NURSING_FACILITY): Payer: Medicare Other | Admitting: Adult Health

## 2013-05-12 DIAGNOSIS — I4891 Unspecified atrial fibrillation: Secondary | ICD-10-CM

## 2013-05-12 DIAGNOSIS — Z7901 Long term (current) use of anticoagulants: Secondary | ICD-10-CM

## 2013-05-14 ENCOUNTER — Encounter: Payer: Self-pay | Admitting: Adult Health

## 2013-05-17 ENCOUNTER — Telehealth: Payer: Self-pay | Admitting: Cardiovascular Disease

## 2013-05-17 DIAGNOSIS — I10 Essential (primary) hypertension: Secondary | ICD-10-CM | POA: Insufficient documentation

## 2013-05-17 DIAGNOSIS — R609 Edema, unspecified: Secondary | ICD-10-CM | POA: Insufficient documentation

## 2013-05-17 DIAGNOSIS — Z7901 Long term (current) use of anticoagulants: Secondary | ICD-10-CM | POA: Insufficient documentation

## 2013-05-17 DIAGNOSIS — K5900 Constipation, unspecified: Secondary | ICD-10-CM | POA: Insufficient documentation

## 2013-05-17 DIAGNOSIS — E039 Hypothyroidism, unspecified: Secondary | ICD-10-CM | POA: Insufficient documentation

## 2013-05-17 NOTE — Assessment & Plan Note (Signed)
Her status is stable will continue spiriva daily

## 2013-05-17 NOTE — Assessment & Plan Note (Signed)
For her inr of 4.5 will hold coumadin 3.5 mg daily for one day and will recheck inr and will monitor

## 2013-05-17 NOTE — Assessment & Plan Note (Signed)
Is stable will continue celexa 10 mg daily and will monitor

## 2013-05-17 NOTE — Assessment & Plan Note (Signed)
Is without change in status will continue aricept 10 mg daily and will monitor

## 2013-05-17 NOTE — Telephone Encounter (Signed)
Concerned because her pacemaker have not been checked in 4 yrs. Wants to know if she can do it over the phone since she is in a wheelchair and in a nursing facility?

## 2013-05-17 NOTE — Assessment & Plan Note (Signed)
Will continue prilosec 20 mg daily will monitor

## 2013-05-17 NOTE — Assessment & Plan Note (Signed)
Will continue senna s 2 tabs daily  

## 2013-05-17 NOTE — Progress Notes (Signed)
Patient ID: [name], female   DOB: 15-Jul-1923, 79 y.o.   MRN: [id number]  [facility]  Allergies  Allergen Reactions  . Advil (Ibuprofen) Other (See Comments)    "think it just doesn't work for her"  . Alprazolam Other (See Comments)    "don't remember what happens"  . Relafen (Nabumetone) Other (See Comments)    "don't remember what happens"  . Sulfa Antibiotics Other (See Comments)    "don't remember what happens"  . Toprol Xl (Metoprolol Succinate) Other (See Comments)    25mg ; "don't remember what happens"     Chief Complaint  Patient presents with  . Medical Managment of Chronic Issues    HPI:   She is being seen for the management of her chronic illnesses. Her inr today is elevated at 4.5 and is taking coumadin 3.5 mg daily. There are no concerns being voiced by the nursing staff. She is not voicing concerns    Past Medical History  Diagnosis Date  . Hypertension   . Hyperlipidemia   . Hypothyroidism   . Atrial fibrillation   . Pacemaker   . Shortness of breath   . CHF (congestive heart failure) 05/27/2006; 05/30/2006; 11/06/2008  . Vascular dementia     "due to CVA 06/08/2009"  . Stroke 03/06/2001; 05/30/2009    "light stroke"  . Stroke 06/08/2009    "probable small TIA"  . Stroke 01/06/2002  . Angina 12/22/2003  . Ventilator dependent 06/08/2009    respiratory failure  . H/O: rheumatic fever     "childhood"  . Coronary artery disease   . Left atrial enlargement   . H/O cardiomegaly     diastolic  . Pulmonary hypertension   . Anemia     "iron transfusions"  . Blood transfusion   . History of lower GI bleeding     "multiple; required blood transfusions"  . COPD (chronic obstructive pulmonary disease)   . GERD (gastroesophageal reflux disease)   . H/O hiatal hernia   . H/O chronic bronchitis   . Asthma   . Arthritis   . Anxiety   . Depression   . Dysphagia as late effect of stroke   . Autoimmune hepatitis 07/12/1999  . Bilateral renal cysts     "2  large on right off the lower pole"  . Chronic renal insufficiency   . Pleural effusion     "moderate right & small left"  . Bacterial pneumonia 11/06/2008; 10/23/2009; 03/30/2010  . Avascular necrosis of femoral head     right    Past Surgical History  Procedure Laterality Date  . Insert / replace / remove pacemaker  09/11/2000  . Av node ablation  "06/06/2005"  . Percutaneous tracheostomy  06/15/2009  . Electroencephalogram  01/28/2002; 11/26/2005; 10/23/2009  . Cardiac catheterization  06/14/1999; 12/26/2003; 05/30/2009  . Transcranial doppler & carotid ultrasound  11/28/2005  . Cardiac valve replacement  08/20/1999    "mitral valve replacement; [employer]"  . Cardiac valve replacement  06/05/2009     "redo sternotomy & aortic valve replacement; tissue valve"  . Cauterized cecal av malformation    . Abdominal hysterectomy  1960's  . Dental implants removed  03/04/2001    lower  . Knee arthroscopy  12/26/2000    left  . Knee arthroscopy  07/29/2001    right    VITAL SIGNS BP 150/84  Pulse 80  Ht 5' (1.524 m)  Wt 129 lb 11.2 oz (58.832 kg)  BMI 25.33 kg/m2  Patient's Medications  New Prescriptions   No medications on file  Previous Medications   ACETAMINOPHEN (TYLENOL) 325 MG TABLET    Take 650 mg by mouth 2 (two) times daily.   ARTIFICIAL TEARS (LACRILUBE) OINT OPHTHALMIC OINTMENT    Place 1 application into both eyes at bedtime.   ASPIRIN EC 81 MG EC TABLET    Take 1 tablet (81 mg total) by mouth daily.   CITALOPRAM (CELEXA) 10 MG TABLET    Take 10 mg by mouth daily.   DONEPEZIL (ARICEPT) 10 MG TABLET    Take 10 mg by mouth at bedtime.   FUROSEMIDE (LASIX) 20 MG TABLET    Take 20 mg by mouth 2 (two) times daily.   IPRATROPIUM-ALBUTEROL (DUONEB) 0.5-2.5 (3) MG/3ML SOLN    Take 3 mLs by nebulization every 6 (six) hours as needed. For shortness of breath   LEVOTHYROXINE (SYNTHROID, LEVOTHROID) 75 MCG TABLET    Take 75 mcg by mouth daily.   LOSARTAN (COZAAR) 50 MG TABLET    Take 50 mg  by mouth daily.   OMEPRAZOLE (PRILOSEC) 20 MG CAPSULE    Take 20 mg by mouth daily.   OYSTER SHELL (OYSTER CALCIUM) 500 MG TABS    Take 500 mg of elemental calcium by mouth 3 (three) times daily.   SENNA (SENOKOT) 8.6 MG TABS    Take 2 tablets by mouth at bedtime.   TIOTROPIUM (SPIRIVA) 18 MCG INHALATION CAPSULE    Place 18 mcg into inhaler and inhale daily.   TRAMADOL (ULTRAM) 50 MG TABLET    Take 25 mg by mouth 2 (two) times daily as needed. For pain  Modified Medications   Modified Medication Previous Medication   WARFARIN (COUMADIN) 4 MG TABLET warfarin (COUMADIN) 4 MG tablet      Take 3.5 mg by mouth daily.    Take 1 tablet (4 mg total) by mouth daily.  Discontinued Medications   No medications on file    SIGNIFICANT DIAGNOSTIC EXAMS   LABS REVIEWED:   01-14-13: wbc 4.9; hgb 10.4; hvct 31.4 ;mcv 90.2 ;plt 190; glucose 65; bun 22; creat 1.12; k+4.1; na++143 Liver normal 3.7; tsh 0.656  Review of Systems  Unable to perform ROS   Physical Exam  Constitutional:  thin  Neck: Neck supple. No tracheal deviation present. No thyromegaly present.  Cardiovascular: Normal rate, regular rhythm and intact distal pulses.   Respiratory: Effort normal and breath sounds normal. No respiratory distress. She has no wheezes.  GI: Soft. Bowel sounds are normal. She exhibits no distension. There is no tenderness.  Musculoskeletal: She exhibits no edema.  Neurological: She is alert.  Skin: Skin is warm and dry.       ASSESSMENT/ PLAN:  COPD (chronic obstructive pulmonary disease) Her status is stable will continue spiriva daily   Depression Is stable will continue celexa 10 mg daily and will monitor   GERD (gastroesophageal reflux disease) Will continue prilosec 20 mg daily will monitor   Vascular dementia Is without change in status will continue aricept 10 mg daily and will monitor   A-fib Is stable will continue coumadin therapy and will continue asa 81 mg daily and will monitor    Chronic anticoagulation For her inr of 4.5 will hold coumadin 3.5 mg daily for one day and will recheck inr and will monitor   Unspecified hypothyroidism Will continue synthroid 75 mcg daily   Arthritis Is stable will continue tylenol 650 mg twice daily and ultram 50 mg twice daily  as needed   Essential hypertension, benign Her blood pressure is elevated will increase the cozaar from 50 mg daily to 75 mg daily and will check bmp in 2 weeks  Unspecified constipation Will continue senna s 2 tabs daily    Will continue senna s 2 tabs daily   Edema Will continue lasix 20 mg twice daily and will monitor     Time spent with patient 50 minutes.

## 2013-05-17 NOTE — Assessment & Plan Note (Signed)
Is stable will continue coumadin therapy and will continue asa 81 mg daily and will monitor

## 2013-05-17 NOTE — Assessment & Plan Note (Signed)
Her blood pressure is elevated will increase the cozaar from 50 mg daily to 75 mg daily and will check bmp in 2 weeks

## 2013-05-17 NOTE — Assessment & Plan Note (Signed)
Will continue lasix 20 mg twice daily and will monitor

## 2013-05-17 NOTE — Assessment & Plan Note (Signed)
Is stable will continue tylenol 650 mg twice daily and ultram 50 mg twice daily as needed

## 2013-05-17 NOTE — Assessment & Plan Note (Signed)
Will continue synthroid 75 mcg daily

## 2013-05-18 NOTE — Telephone Encounter (Signed)
Daughter need to know if her mother can have her pacemaker checked over the phone or at [facility]? If she comes in she will be in a wheelchair-can they check it from her wheelchair? Need to know asap-if she needs to come.she needs to get off of work and get transportation for her.Please let her know this right away if possuble-Please call at home until 12:30 after 1:00 call her at work.

## 2013-05-18 NOTE — Telephone Encounter (Signed)
Cristyn's (daughter) work number is [phone].

## 2013-05-18 NOTE — Telephone Encounter (Signed)
Returned call and spoke w/ Corrie Dandy, pt's daughter.  Stated pt is paralyzed since having a stroke and is not able to get on the exam table.  Wanted to know if pt will be able to be seen and if her pacemaker can be checked from the wheelchair.  Informed Ellysia that pacemaker can be checked from the wheelchair and if pt is paralyzed her exam will need to be done from the wheelchair as well.  Lakeia also asked if pt would still be able to have pacemaker checked over the phone.  Informed that telephone transmission is still possible with the right equipment.  Advised pt keep appt w/ Dr. Alanda Amass on 8.7.14 to discuss further.  Phynix wanted to be sure everything can be done b/c she has to arrange transportation for pt to get here and she has to take off work.  Informed JC, LPN will be notified.  Verbalized understanding.

## 2013-05-18 NOTE — Telephone Encounter (Signed)
Call to [facility] and informed JC, LPN will be notified to call her tomorrow morning.  Verbalized understanding.

## 2013-05-19 ENCOUNTER — Non-Acute Institutional Stay (SKILLED_NURSING_FACILITY): Payer: PRIVATE HEALTH INSURANCE | Admitting: Adult Health

## 2013-05-19 DIAGNOSIS — I4891 Unspecified atrial fibrillation: Secondary | ICD-10-CM

## 2013-05-19 DIAGNOSIS — Z7901 Long term (current) use of anticoagulants: Secondary | ICD-10-CM

## 2013-05-19 NOTE — Telephone Encounter (Signed)
pts daughter instructed to keep appt with dr. Alanda Amass in office

## 2013-05-19 NOTE — Telephone Encounter (Signed)
Attempted to call, no answer.

## 2013-05-20 ENCOUNTER — Encounter: Payer: Self-pay | Admitting: Cardiovascular Disease

## 2013-05-20 LAB — PACEMAKER DEVICE OBSERVATION

## 2013-05-21 ENCOUNTER — Inpatient Hospital Stay (HOSPITAL_COMMUNITY)
Admission: RE | Admit: 2013-05-21 | Discharge: 2013-05-28 | DRG: 259 | Disposition: A | Discharge status: Skilled Nursing Facility | Payer: PRIVATE HEALTH INSURANCE | Source: Ambulatory Visit | Attending: Cardiovascular Disease | Admitting: Cardiovascular Disease

## 2013-05-21 ENCOUNTER — Encounter (HOSPITAL_COMMUNITY)
Admission: RE | Disposition: A | Discharge status: Skilled Nursing Facility | Payer: Self-pay | Source: Ambulatory Visit | Attending: Cardiovascular Disease

## 2013-05-21 ENCOUNTER — Encounter (HOSPITAL_COMMUNITY): Payer: Self-pay | Admitting: *Deleted

## 2013-05-21 DIAGNOSIS — R791 Abnormal coagulation profile: Secondary | ICD-10-CM | POA: Diagnosis present

## 2013-05-21 DIAGNOSIS — J449 Chronic obstructive pulmonary disease, unspecified: Secondary | ICD-10-CM

## 2013-05-21 DIAGNOSIS — I442 Atrioventricular block, complete: Secondary | ICD-10-CM

## 2013-05-21 DIAGNOSIS — I2510 Atherosclerotic heart disease of native coronary artery without angina pectoris: Secondary | ICD-10-CM | POA: Diagnosis present

## 2013-05-21 DIAGNOSIS — Z950 Presence of cardiac pacemaker: Secondary | ICD-10-CM

## 2013-05-21 DIAGNOSIS — I4729 Other ventricular tachycardia: Secondary | ICD-10-CM | POA: Diagnosis not present

## 2013-05-21 DIAGNOSIS — F0150 Vascular dementia without behavioral disturbance: Secondary | ICD-10-CM

## 2013-05-21 DIAGNOSIS — I10 Essential (primary) hypertension: Secondary | ICD-10-CM

## 2013-05-21 DIAGNOSIS — Z01818 Encounter for other preprocedural examination: Secondary | ICD-10-CM

## 2013-05-21 DIAGNOSIS — Z954 Presence of other heart-valve replacement: Secondary | ICD-10-CM

## 2013-05-21 DIAGNOSIS — R5381 Other malaise: Secondary | ICD-10-CM | POA: Diagnosis present

## 2013-05-21 DIAGNOSIS — I472 Ventricular tachycardia, unspecified: Secondary | ICD-10-CM | POA: Diagnosis not present

## 2013-05-21 DIAGNOSIS — I272 Pulmonary hypertension, unspecified: Secondary | ICD-10-CM

## 2013-05-21 DIAGNOSIS — Z952 Presence of prosthetic heart valve: Secondary | ICD-10-CM

## 2013-05-21 DIAGNOSIS — Z45010 Encounter for checking and testing of cardiac pacemaker pulse generator [battery]: Secondary | ICD-10-CM

## 2013-05-21 DIAGNOSIS — Z7901 Long term (current) use of anticoagulants: Secondary | ICD-10-CM

## 2013-05-21 DIAGNOSIS — R4182 Altered mental status, unspecified: Secondary | ICD-10-CM

## 2013-05-21 DIAGNOSIS — Z45018 Encounter for adjustment and management of other part of cardiac pacemaker: Principal | ICD-10-CM

## 2013-05-21 DIAGNOSIS — I959 Hypotension, unspecified: Secondary | ICD-10-CM | POA: Diagnosis present

## 2013-05-21 DIAGNOSIS — I4891 Unspecified atrial fibrillation: Secondary | ICD-10-CM | POA: Diagnosis present

## 2013-05-21 DIAGNOSIS — I4589 Other specified conduction disorders: Secondary | ICD-10-CM | POA: Diagnosis present

## 2013-05-21 LAB — BASIC METABOLIC PANEL
Chloride: 102 mEq/L (ref 96–112)
GFR calc Af Amer: 39 mL/min — ABNORMAL LOW (ref >90)
Potassium: 3.9 mEq/L (ref 3.5–5.1)
Sodium: 141 mEq/L (ref 135–145)

## 2013-05-21 LAB — CBC
Hemoglobin: 12 g/dL (ref 12.0–15.0)
MCH: 30.8 pg (ref 26.0–34.0)
RBC: 3.9 MIL/uL (ref 3.87–5.11)

## 2013-05-21 LAB — PROTIME-INR
INR: 3.17 — ABNORMAL HIGH (ref 0.00–1.49)
Prothrombin Time: 31.4 seconds — ABNORMAL HIGH (ref 11.6–15.2)

## 2013-05-21 LAB — SURGICAL PCR SCREEN
MRSA, PCR: NEGATIVE
Staphylococcus aureus: POSITIVE — AB

## 2013-05-21 SURGERY — PACEMAKER GENERATOR CHANGE
Anesthesia: LOCAL

## 2013-05-21 MED ORDER — SODIUM CHLORIDE 0.9 % IJ SOLN
3.0000 mL | Freq: Two times a day (BID) | INTRAMUSCULAR | Status: DC
  Administered 2013-05-21 – 2013-05-27 (×7): 3 mL via INTRAVENOUS

## 2013-05-21 MED ORDER — DONEPEZIL HCL 10 MG PO TABS
10.0000 mg | ORAL_TABLET | Freq: Every day | ORAL | Status: DC
  Administered 2013-05-21 – 2013-05-27 (×7): 10 mg via ORAL
  Filled 2013-05-21 (×9): qty 1

## 2013-05-21 MED ORDER — IPRATROPIUM BROMIDE 0.02 % IN SOLN: 0.5000 mg | Freq: Four times a day (QID) | RESPIRATORY_TRACT | Status: DC | PRN

## 2013-05-21 MED ORDER — LEVOTHYROXINE SODIUM 75 MCG PO TABS
75.0000 ug | ORAL_TABLET | Freq: Every day | ORAL | Status: DC
  Administered 2013-05-22 – 2013-05-28 (×6): 75 ug via ORAL
  Filled 2013-05-21 (×9): qty 1

## 2013-05-21 MED ORDER — ASPIRIN EC 81 MG PO TBEC
81.0000 mg | DELAYED_RELEASE_TABLET | Freq: Every day | ORAL | Status: DC
  Administered 2013-05-21 – 2013-05-28 (×7): 81 mg via ORAL
  Filled 2013-05-21 (×9): qty 1

## 2013-05-21 MED ORDER — SODIUM CHLORIDE 0.9 % IV SOLN: INTRAVENOUS | Status: DC

## 2013-05-21 MED ORDER — MUPIROCIN 2 % EX OINT
TOPICAL_OINTMENT | CUTANEOUS | Status: AC
  Administered 2013-05-22: 1 via NASAL
  Filled 2013-05-21: qty 22

## 2013-05-21 MED ORDER — LOSARTAN POTASSIUM 50 MG PO TABS
50.0000 mg | ORAL_TABLET | Freq: Every day | ORAL | Status: DC
  Administered 2013-05-21 – 2013-05-28 (×7): 50 mg via ORAL
  Filled 2013-05-21 (×9): qty 1

## 2013-05-21 MED ORDER — MUPIROCIN 2 % EX OINT
1.0000 "application " | TOPICAL_OINTMENT | Freq: Two times a day (BID) | CUTANEOUS | Status: AC
  Administered 2013-05-21 – 2013-05-26 (×8): 1 via NASAL
  Filled 2013-05-21: qty 22

## 2013-05-21 MED ORDER — MUPIROCIN 2 % EX OINT
TOPICAL_OINTMENT | Freq: Two times a day (BID) | CUTANEOUS | Status: DC
  Administered 2013-05-23 – 2013-05-28 (×7): via NASAL
  Filled 2013-05-21 (×2): qty 22

## 2013-05-21 MED ORDER — IPRATROPIUM-ALBUTEROL 0.5-2.5 (3) MG/3ML IN SOLN: 3.0000 mL | Freq: Four times a day (QID) | RESPIRATORY_TRACT | Status: DC | PRN

## 2013-05-21 MED ORDER — CEFAZOLIN SODIUM-DEXTROSE 2-3 GM-% IV SOLR: 2.0000 g | INTRAVENOUS | Status: DC

## 2013-05-21 MED ORDER — ARTIFICIAL TEARS OP OINT
1.0000 "application " | TOPICAL_OINTMENT | Freq: Every day | OPHTHALMIC | Status: DC
  Administered 2013-05-21 – 2013-05-27 (×6): 1 via OPHTHALMIC
  Filled 2013-05-21 (×2): qty 3.5

## 2013-05-21 MED ORDER — CHLORHEXIDINE GLUCONATE CLOTH 2 % EX PADS
6.0000 | MEDICATED_PAD | Freq: Every day | CUTANEOUS | Status: AC
  Administered 2013-05-21 – 2013-05-24 (×4): 6 via TOPICAL

## 2013-05-21 MED ORDER — SODIUM CHLORIDE 0.9 % IV SOLN
INTRAVENOUS | Status: DC
  Administered 2013-05-25: 06:00:00 via INTRAVENOUS

## 2013-05-21 MED ORDER — SENNA 8.6 MG PO TABS
2.0000 | ORAL_TABLET | Freq: Every day | ORAL | Status: DC
  Administered 2013-05-21 – 2013-05-26 (×5): 17.2 mg via ORAL
  Filled 2013-05-21 (×9): qty 2

## 2013-05-21 MED ORDER — TRAMADOL HCL 50 MG PO TABS
25.0000 mg | ORAL_TABLET | Freq: Two times a day (BID) | ORAL | Status: DC | PRN
  Administered 2013-05-25: 25 mg via ORAL
  Filled 2013-05-21: qty 1

## 2013-05-21 MED ORDER — TIOTROPIUM BROMIDE MONOHYDRATE 18 MCG IN CAPS
18.0000 ug | ORAL_CAPSULE | Freq: Every day | RESPIRATORY_TRACT | Status: DC
  Administered 2013-05-21 – 2013-05-27 (×7): 18 ug via RESPIRATORY_TRACT
  Filled 2013-05-21 (×2): qty 5

## 2013-05-21 MED ORDER — FUROSEMIDE 20 MG PO TABS
20.0000 mg | ORAL_TABLET | Freq: Two times a day (BID) | ORAL | Status: DC
  Administered 2013-05-21 – 2013-05-28 (×13): 20 mg via ORAL
  Filled 2013-05-21 (×16): qty 1

## 2013-05-21 MED ORDER — ALBUTEROL SULFATE (5 MG/ML) 0.5% IN NEBU: 2.5000 mg | INHALATION_SOLUTION | Freq: Four times a day (QID) | RESPIRATORY_TRACT | Status: DC | PRN

## 2013-05-21 MED ORDER — PANTOPRAZOLE SODIUM 40 MG PO TBEC
40.0000 mg | DELAYED_RELEASE_TABLET | Freq: Every day | ORAL | Status: DC
  Administered 2013-05-22 – 2013-05-28 (×7): 40 mg via ORAL
  Filled 2013-05-21 (×7): qty 1

## 2013-05-21 MED ORDER — SODIUM CHLORIDE 0.9 % IR SOLN: 80.0000 mg | Status: DC

## 2013-05-21 MED ORDER — CITALOPRAM HYDROBROMIDE 10 MG PO TABS
10.0000 mg | ORAL_TABLET | Freq: Every day | ORAL | Status: DC
  Administered 2013-05-21 – 2013-05-28 (×8): 10 mg via ORAL
  Filled 2013-05-21 (×9): qty 1

## 2013-05-21 NOTE — Progress Notes (Signed)
Pacemaker has become inoperational. She is in idioventricular rhythm 37 bpm, BP 128/90, alert and comfortable. Transcutaneous pacing pads placed. Will try to avoid invasive procedures as she is excessively anticoagulated. Transcutaneous pads placed. May need cathecholamine infusion or temporary transvenous pacemaker if she becomes symptomatic. Plan generator change Monday AM.

## 2013-05-21 NOTE — Progress Notes (Signed)
ANTICOAGULATION CONSULT NOTE - Initial Consult  Pharmacy Consult for heparin Indication: mitral valve replacement  Allergies  Allergen Reactions  . Advil (Ibuprofen) Other (See Comments)    "think it just doesn't work for her"  . Alprazolam Other (See Comments)    "don't remember what happens"  . Relafen (Nabumetone) Other (See Comments)    "don't remember what happens"  . Sulfa Antibiotics Other (See Comments)    "don't remember what happens"  . Toprol Xl (Metoprolol Succinate) Other (See Comments)    25mg ; "don't remember what happens"    Patient Measurements: Height: 5\' 2"  (157.5 cm) Weight: 112 lb (50.803 kg) IBW/kg (Calculated) : 50.1  Vital Signs: Temp: 97.5 F (36.4 C) (08/08 1027) Temp src: Oral (08/08 1027) BP: 110/49 mmHg (08/08 1358) Pulse Rate: 37 (08/08 1358)  Labs:  Recent Labs  05/21/13 1100  HGB 12.0  HCT 36.1  PLT 215  LABPROT 31.4*  INR 3.17*  CREATININE 1.37*    Estimated Creatinine Clearance: 22.9 ml/min (by C-G formula based on Cr of 1.37).   Medical History: Past Medical History  Diagnosis Date  . Hypertension   . Hyperlipidemia   . Hypothyroidism   . Atrial fibrillation   . Pacemaker   . Shortness of breath   . CHF (congestive heart failure) 05/27/2006; 05/30/2006; 11/06/2008  . Vascular dementia     "due to CVA 06/08/2009"  . Stroke 03/06/2001; 05/30/2009    "light stroke"  . Stroke 06/08/2009    "probable small TIA"  . Stroke 01/06/2002  . Angina 12/22/2003  . Ventilator dependent 06/08/2009    respiratory failure  . H/O: rheumatic fever     "childhood"  . Coronary artery disease   . Left atrial enlargement   . H/O cardiomegaly     diastolic  . Pulmonary hypertension   . Anemia     "iron transfusions"  . Blood transfusion   . History of lower GI bleeding     "multiple; required blood transfusions"  . COPD (chronic obstructive pulmonary disease)   . GERD (gastroesophageal reflux disease)   . H/O hiatal hernia   . H/O  chronic bronchitis   . Asthma   . Arthritis   . Anxiety   . Depression   . Dysphagia as late effect of stroke   . Autoimmune hepatitis 07/12/1999  . Bilateral renal cysts     "2 large on right off the lower pole"  . Chronic renal insufficiency   . Pleural effusion     "moderate right & small left"  . Bacterial pneumonia 11/06/2008; 10/23/2009; 03/30/2010  . Avascular necrosis of femoral head     right    Medications:  Prescriptions prior to admission  Medication Sig Dispense Refill  . acetaminophen (TYLENOL) 325 MG tablet Take 650 mg by mouth 2 (two) times daily.      Marland Kitchen artificial tears (LACRILUBE) OINT ophthalmic ointment Place 1 application into both eyes at bedtime.      Marland Kitchen aspirin EC 81 MG EC tablet Take 1 tablet (81 mg total) by mouth daily.      . citalopram (CELEXA) 10 MG tablet Take 10 mg by mouth daily.      Marland Kitchen donepezil (ARICEPT) 10 MG tablet Take 10 mg by mouth at bedtime.      . furosemide (LASIX) 20 MG tablet Take 20 mg by mouth 2 (two) times daily.      Marland Kitchen ipratropium-albuterol (DUONEB) 0.5-2.5 (3) MG/3ML SOLN Take 3 mLs by nebulization every 6 (  six) hours as needed. For shortness of breath      . levothyroxine (SYNTHROID, LEVOTHROID) 75 MCG tablet Take 75 mcg by mouth daily.      Marland Kitchen losartan (COZAAR) 50 MG tablet Take 50 mg by mouth daily.      Marland Kitchen omeprazole (PRILOSEC) 20 MG capsule Take 20 mg by mouth daily.      Ethelda Chick (OYSTER CALCIUM) 500 MG TABS Take 500 mg of elemental calcium by mouth 3 (three) times daily.      Marland Kitchen senna (SENOKOT) 8.6 MG TABS Take 2 tablets by mouth at bedtime.      Marland Kitchen tiotropium (SPIRIVA) 18 MCG inhalation capsule Place 18 mcg into inhaler and inhale daily.      . traMADol (ULTRAM) 50 MG tablet Take 25 mg by mouth 2 (two) times daily as needed. For pain      . warfarin (COUMADIN) 4 MG tablet Take 3.5 mg by mouth daily.       Scheduled:  .  ceFAZolin (ANCEF) IV  2 g Intravenous On Call  . gentamicin irrigation  80 mg Irrigation On Call  .  mupirocin ointment      . mupirocin ointment   Nasal BID    Assessment: 79 yo female here for pacemaker generator change out on coumadin PTA for mitral valve replacement and afib/CVA. Patient to start heparin when INR < 2.5. INR today is 3.17   Goal of Therapy:  Heparin level 0.3-0.7 units/ml Monitor platelets by anticoagulation protocol: Yes Goal INR on coumadin 2.5-3.5 (per [facility] progress note 03/09/13)   Plan:  -No heparin now -Begin heparin when INR < 2.5 -Daily PT/INR  Harland German, Pharm D 05/21/2013 2:11 PM

## 2013-05-21 NOTE — H&P (Addendum)
Date of Initial H&P: 05/20/2013, Dr. Alanda Amass  Pacemaker dependent patient (s/p AV node ablation) with device at EOL, here for pacemaker generator changeout.  History reviewed, patient examined, no change in status, stable for surgery.  Will benefit from temporary transvenous pacing wire for backup during implantation.  This procedure has been fully reviewed with the patient and family and  written informed consent has been obtained.  Thurmon Fair, MD, [facility] [phone] office [phone] pager   INR was >3, will defer pacemaker generator changeout until INR around 2. Will need to stay in hospital for possible need for emergency pacing and for heparin "bridging" for her prosthetic mechanical mitral valve.

## 2013-05-22 DIAGNOSIS — I442 Atrioventricular block, complete: Secondary | ICD-10-CM

## 2013-05-22 DIAGNOSIS — F0150 Vascular dementia without behavioral disturbance: Secondary | ICD-10-CM

## 2013-05-22 DIAGNOSIS — I4891 Unspecified atrial fibrillation: Secondary | ICD-10-CM

## 2013-05-22 DIAGNOSIS — Z45018 Encounter for adjustment and management of other part of cardiac pacemaker: Principal | ICD-10-CM

## 2013-05-22 MED ORDER — ENSURE COMPLETE PO LIQD
237.0000 mL | Freq: Two times a day (BID) | ORAL | Status: DC
  Administered 2013-05-22 – 2013-05-28 (×10): 237 mL via ORAL

## 2013-05-22 MED ORDER — BIOTENE DRY MOUTH MT LIQD
15.0000 mL | Freq: Two times a day (BID) | OROMUCOSAL | Status: DC
  Administered 2013-05-22 – 2013-05-27 (×10): 15 mL via OROMUCOSAL

## 2013-05-22 MED ORDER — ADULT MULTIVITAMIN W/MINERALS CH
1.0000 | ORAL_TABLET | Freq: Every day | ORAL | Status: DC
  Administered 2013-05-22 – 2013-05-28 (×7): 1 via ORAL
  Filled 2013-05-22 (×8): qty 1

## 2013-05-22 NOTE — Progress Notes (Signed)
Subjective: No complaints  Objective: Vital signs in last 24 hours: Temp:  [97.5 F (36.4 C)-98.5 F (36.9 C)] 97.7 F (36.5 C) (08/09 0725) Pulse Rate:  [32-42] 32 (08/09 0700) Resp:  [12-18] 16 (08/09 0700) BP: (106-149)/(47-80) 132/50 mmHg (08/09 0700) SpO2:  [99 %-100 %] 100 % (08/09 0700) Weight:  [112 lb (50.803 kg)] 112 lb (50.803 kg) (08/08 1027)    Intake/Output from previous day: 08/08 0701 - 08/09 0700 In: 477.5 [P.O.:270; I.V.:207.5] Out: -  Intake/Output this shift:    Medications Current Facility-Administered Medications  Medication Dose Route Frequency Provider Last Rate Last Dose  . 0.9 %  sodium chloride infusion   Intravenous Continuous Mihai Croitoru, MD 10 mL/hr at 05/22/13 0700    . ipratropium (ATROVENT) nebulizer solution 0.5 mg  0.5 mg Nebulization Q6H PRN Lauren Bajbus, RPH       And  . albuterol (PROVENTIL) (5 MG/ML) 0.5% nebulizer solution 2.5 mg  2.5 mg Nebulization Q6H PRN Lauren Bajbus, RPH      . artificial tears (LACRILUBE) ophthalmic ointment 1 application  1 application Both Eyes QHS Wilburt Finlay, PA-C   1 application at 05/21/13 2344  . aspirin EC tablet 81 mg  81 mg Oral Daily Wilburt Finlay, PA-C   81 mg at 05/21/13 1814  . Chlorhexidine Gluconate Cloth 2 % PADS 6 each  6 each Topical Daily Thurmon Fair, MD   6 each at 05/21/13 1845  . citalopram (CELEXA) tablet 10 mg  10 mg Oral Daily Wilburt Finlay, PA-C   10 mg at 05/21/13 1814  . donepezil (ARICEPT) tablet 10 mg  10 mg Oral QHS Wilburt Finlay, PA-C   10 mg at 05/21/13 2227  . furosemide (LASIX) tablet 20 mg  20 mg Oral BID Wilburt Finlay, PA-C   20 mg at 05/21/13 1814  . levothyroxine (SYNTHROID, LEVOTHROID) tablet 75 mcg  75 mcg Oral QAC breakfast Wilburt Finlay, PA-C      . losartan (COZAAR) tablet 50 mg  50 mg Oral Daily Wilburt Finlay, PA-C   50 mg at 05/21/13 1814  . mupirocin ointment (BACTROBAN) 2 % 1 application  1 application Nasal BID Thurmon Fair, MD   1 application at 05/21/13 2229  .  mupirocin ointment (BACTROBAN) 2 %   Nasal BID Mihai Croitoru, MD      . pantoprazole (PROTONIX) EC tablet 40 mg  40 mg Oral Daily Wilburt Finlay, PA-C      . senna (SENOKOT) tablet 17.2 mg  2 tablet Oral QHS Wilburt Finlay, PA-C   17.2 mg at 05/21/13 2344  . sodium chloride 0.9 % injection 3 mL  3 mL Intravenous Q12H Wilburt Finlay, PA-C   3 mL at 05/21/13 2232  . tiotropium (SPIRIVA) inhalation capsule 18 mcg  18 mcg Inhalation QHS Wilburt Finlay, PA-C   18 mcg at 05/21/13 1925  . traMADol (ULTRAM) tablet 25 mg  25 mg Oral Q12H PRN Wilburt Finlay, PA-C        PE: General appearance: alert, cooperative and no distress Lungs: clear to auscultation bilaterally Heart: Reg rhythm. Rate slow.  No MM Extremities: No LEE Pulses: 2+ and symmetric Neurologic: Grossly normal  Lab Results:   Recent Labs  05/21/13 1100  WBC 5.3  HGB 12.0  HCT 36.1  PLT 215   BMET  Recent Labs  05/21/13 1100  NA 141  K 3.9  CL 102  CO2 32  GLUCOSE 87  BUN 28*  CREATININE 1.37*  CALCIUM 10.3  PT/INR  Recent Labs  05/21/13 1100 05/22/13 0251  LABPROT 31.4* 31.2*  INR 3.17* 3.15*    Assessment/Plan  Principal Problem:   Pacemaker end of life Active Problems:   Chronic anticoagulation   Coronary artery disease  Plan:  Pacemaker battery completely drained.  HR between 32-42 and stable.  BP stable.  Waiting for INR to drift down.  PM replacement Tuesday.  Will add dopamine if she slows any more.  Right now she is stable.  Start heparin when INR <2.5.  Hx of MVR.  Pacer pads in place.   LOS: 1 day    Lashawnda Hancox 05/22/2013 7:44 AM

## 2013-05-22 NOTE — Progress Notes (Addendum)
INITIAL NUTRITION ASSESSMENT  DOCUMENTATION CODES Per approved criteria  -Not Applicable   INTERVENTION:  Ensure Complete PO BID, each supplement provides 350 kcal and 13 grams of protein.  MVI daily.  Recommend swallow evaluation to determine safest diet consistency for patient.  NUTRITION DIAGNOSIS: Inadequate oral intake related to chewing and swallowing difficulty as evidenced by weight loss.   Goal: Intake to meet >90% of estimated nutrition needs.  Monitor:  PO intake, labs, weight trend.  Reason for Assessment: MST=2  79 y.o. female  Admitting Dx: Pacemaker end of life  ASSESSMENT: Patient is pacemaker dependent (s/p AV node ablation) with device at EOL, here for pacemaker generator changeout. Awaiting INR to drift down. For generator changeout on Monday.  Spoke with patient's daughter who is surprised about weight loss; weight change could be related to incorrect bed weight. Daughter reports that patient has been eating less than usual because she cannot eat some foods that she receives at her SNF. She has been upgraded from pureed foods to soft foods per daughter. Recommend re-weigh patient.  Unable to perform full nutrition focused physical exam due to patient's positioning in chair. Legs do not appear to have wasting. Patient is at nutrition risk due to recent weight loss and decreased PO intake. Suspect some form of malnutrition.   Height: Ht Readings from Last 1 Encounters:  05/21/13 5\' 2"  (1.575 m)    Weight: Wt Readings from Last 1 Encounters:  05/21/13 112 lb (50.803 kg)    Ideal Body Weight: 50 kg  % Ideal Body Weight: 102%  Wt Readings from Last 10 Encounters:  05/21/13 112 lb (50.803 kg)  05/21/13 112 lb (50.803 kg)  05/05/13 129 lb 11.2 oz (58.832 kg)  06/08/12 137 lb (62.143 kg)  02/05/12 136 lb 6.4 oz (61.871 kg)    Usual Body Weight: 129-137 lb  % Usual Body Weight: 82-87%  BMI:  Body mass index is 20.48 kg/(m^2).  Estimated  Nutritional Needs: Kcal: 1300-1450 Protein: 65-80 gm Fluid: 1.3-1.5 L  Skin: no problems  Diet Order: Cardiac  EDUCATION NEEDS: -Education not appropriate at this time   Intake/Output Summary (Last 24 hours) at 05/22/13 0930 Last data filed at 05/22/13 0900  Gross per 24 hour  Intake  597.5 ml  Output      0 ml  Net  597.5 ml    Last BM: none documented since admission   Labs:   Recent Labs Lab 05/21/13 1100  NA 141  K 3.9  CL 102  CO2 32  BUN 28*  CREATININE 1.37*  CALCIUM 10.3  GLUCOSE 87    CBG (last 3)  No results found for this basename: GLUCAP,  in the last 72 hours  Scheduled Meds: . artificial tears  1 application Both Eyes QHS  . aspirin EC  81 mg Oral Daily  . Chlorhexidine Gluconate Cloth  6 each Topical Daily  . citalopram  10 mg Oral Daily  . donepezil  10 mg Oral QHS  . furosemide  20 mg Oral BID  . levothyroxine  75 mcg Oral QAC breakfast  . losartan  50 mg Oral Daily  . mupirocin ointment  1 application Nasal BID  . mupirocin ointment   Nasal BID  . pantoprazole  40 mg Oral Daily  . senna  2 tablet Oral QHS  . sodium chloride  3 mL Intravenous Q12H  . tiotropium  18 mcg Inhalation QHS    Continuous Infusions: . sodium chloride 10 mL/hr at 05/22/13 0700  Past Medical History  Diagnosis Date  . Hypertension   . Hyperlipidemia   . Hypothyroidism   . Atrial fibrillation   . Pacemaker   . Shortness of breath   . CHF (congestive heart failure) 05/27/2006; 05/30/2006; 11/06/2008  . Vascular dementia     "due to CVA 06/08/2009"  . Stroke 03/06/2001; 05/30/2009    "light stroke"  . Stroke 06/08/2009    "probable small TIA"  . Stroke 01/06/2002  . Angina 12/22/2003  . Ventilator dependent 06/08/2009    respiratory failure  . H/O: rheumatic fever     "childhood"  . Coronary artery disease   . Left atrial enlargement   . H/O cardiomegaly     diastolic  . Pulmonary hypertension   . Anemia     "iron transfusions"  . Blood  transfusion   . History of lower GI bleeding     "multiple; required blood transfusions"  . COPD (chronic obstructive pulmonary disease)   . GERD (gastroesophageal reflux disease)   . H/O hiatal hernia   . H/O chronic bronchitis   . Asthma   . Arthritis   . Anxiety   . Depression   . Dysphagia as late effect of stroke   . Autoimmune hepatitis 07/12/1999  . Bilateral renal cysts     "2 large on right off the lower pole"  . Chronic renal insufficiency   . Pleural effusion     "moderate right & small left"  . Bacterial pneumonia 11/06/2008; 10/23/2009; 03/30/2010  . Avascular necrosis of femoral head     right    Past Surgical History  Procedure Laterality Date  . Insert / replace / remove pacemaker  09/11/2000  . Av node ablation  "06/06/2005"  . Percutaneous tracheostomy  06/15/2009  . Electroencephalogram  01/28/2002; 11/26/2005; 10/23/2009  . Cardiac catheterization  06/14/1999; 12/26/2003; 05/30/2009  . Transcranial doppler & carotid ultrasound  11/28/2005  . Cardiac valve replacement  08/20/1999    "mitral valve replacement; [employer]"  . Cardiac valve replacement  06/05/2009     "redo sternotomy & aortic valve replacement; tissue valve"  . Cauterized cecal av malformation    . Abdominal hysterectomy  1960's  . Dental implants removed  03/04/2001    lower  . Knee arthroscopy  12/26/2000    left  . Knee arthroscopy  07/29/2001    right    Joaquin Courts, RD, LDN, CNSC Pager [phone] After Hours Pager [phone]

## 2013-05-22 NOTE — Progress Notes (Signed)
Pt. Seen and examined. Agree with the NP/PA-C note as written. 79 yo female with pacer at EOL. She has a stable escape rhythm in the 30-40's. Says she feels tired, but BP stable in the 120's.  Recommend continued bedrest. Awaiting INR to drift down. For generator changeout on Monday with Dr. Wayne Sever, MD, [facility] Attending Cardiologist [facility]

## 2013-05-23 ENCOUNTER — Inpatient Hospital Stay (HOSPITAL_COMMUNITY): Payer: PRIVATE HEALTH INSURANCE

## 2013-05-23 DIAGNOSIS — I472 Ventricular tachycardia: Secondary | ICD-10-CM

## 2013-05-23 LAB — HEPARIN LEVEL (UNFRACTIONATED): Heparin Unfractionated: 0.73 IU/mL — ABNORMAL HIGH (ref 0.30–0.70)

## 2013-05-23 LAB — PROTIME-INR: INR: 2.31 — ABNORMAL HIGH (ref 0.00–1.49)

## 2013-05-23 MED ORDER — SODIUM CHLORIDE 0.9 % IV SOLN
INTRAVENOUS | Status: DC
  Administered 2013-05-24: 06:00:00 via INTRAVENOUS

## 2013-05-23 MED ORDER — CHLORHEXIDINE GLUCONATE 4 % EX LIQD
60.0000 mL | Freq: Once | CUTANEOUS | Status: AC
  Administered 2013-05-24: 4 via TOPICAL
  Filled 2013-05-23: qty 60

## 2013-05-23 MED ORDER — CEFAZOLIN SODIUM-DEXTROSE 2-3 GM-% IV SOLR
2.0000 g | INTRAVENOUS | Status: DC
  Filled 2013-05-23: qty 50

## 2013-05-23 MED ORDER — YOU HAVE A PACEMAKER BOOK
Freq: Once | Status: AC
  Administered 2013-05-24: 12:00:00
  Filled 2013-05-23 (×2): qty 1

## 2013-05-23 MED ORDER — CHLORHEXIDINE GLUCONATE 4 % EX LIQD
60.0000 mL | Freq: Once | CUTANEOUS | Status: AC
  Administered 2013-05-23: 4 via TOPICAL
  Filled 2013-05-23 (×2): qty 60

## 2013-05-23 MED ORDER — HEPARIN (PORCINE) IN NACL 100-0.45 UNIT/ML-% IJ SOLN
600.0000 [IU]/h | INTRAMUSCULAR | Status: AC
  Administered 2013-05-23: 700 [IU]/h via INTRAVENOUS
  Filled 2013-05-23: qty 250

## 2013-05-23 MED ORDER — SODIUM CHLORIDE 0.9 % IR SOLN
80.0000 mg | Status: DC
  Filled 2013-05-23: qty 2

## 2013-05-23 NOTE — Progress Notes (Signed)
ANTICOAGULATION CONSULT NOTE - Follow Up Consult  Pharmacy Consult for Heparin Indication: MVR, afib, CVA  Allergies  Allergen Reactions  . Advil (Ibuprofen) Other (See Comments)    "think it just doesn't work for her"  . Alprazolam Other (See Comments)    "don't remember what happens"  . Relafen (Nabumetone) Other (See Comments)    "don't remember what happens"  . Sulfa Antibiotics Other (See Comments)    "don't remember what happens"  . Toprol Xl (Metoprolol Succinate) Other (See Comments)    25mg ; "don't remember what happens"    Patient Measurements: Height: 5\' 2"  (157.5 cm) Weight: 125 lb 8 oz (56.926 kg) IBW/kg (Calculated) : 50.1 Heparin Dosing Weight: 56.9kg  Vital Signs: Temp: 98.1 F (36.7 C) (08/10 0000) Temp src: Oral (08/10 0000) BP: 106/72 mmHg (08/10 1000) Pulse Rate: 85 (08/10 1000)  Labs:  Recent Labs  05/21/13 1100 05/22/13 0251 05/23/13 0530  HGB 12.0  --   --   HCT 36.1  --   --   PLT 215  --   --   LABPROT 31.4* 31.2* 24.6*  INR 3.17* 3.15* 2.31*  CREATININE 1.37*  --   --     Estimated Creatinine Clearance: 22.9 ml/min (by C-G formula based on Cr of 1.37).  Assessment: 89yof on coumadin pta for mitral valve replacement as well as hx afib and CVA. Admitted for pacemaker generator change out. Coumadin placed on hold and pharmacy received orders to begin heparin once INR < 2.5. INR today is 2.31 so will start heparin bridge. Plan is for generator change tomorrow 8/11.  Goal of Therapy:  Heparin level 0.3-0.7 units/ml INR 2.5-3.5 Monitor platelets by anticoagulation protocol: Yes   Plan:  1) No bolus 2) Begin heparin at 700 units/hr 3) Check 8 hour heparin level  Fredrik Rigger 05/23/2013,10:49 AM

## 2013-05-23 NOTE — Progress Notes (Signed)
DAILY PROGRESS NOTE  Subjective:  8 beat run of slow NSVT noted overnight (rate ~60), then converted back to escape rhythm at 35. BP stable. Seems that she pretty much sleeps all day long.  No complaints.  Not much appetite.  Objective:  Temp:  [97.9 F (36.6 C)-98.1 F (36.7 C)] 98.1 F (36.7 C) (08/10 0000) Pulse Rate:  [32-56] 34 (08/10 0800) Resp:  [13-19] 15 (08/10 0800) BP: (78-137)/(37-57) 131/46 mmHg (08/10 0800) SpO2:  [97 %-100 %] 100 % (08/10 0800) Weight:  [125 lb 8 oz (56.926 kg)] 125 lb 8 oz (56.926 kg) (08/10 0600) Weight change: 13 lb 8 oz (6.124 kg)  Intake/Output from previous day: 08/09 0701 - 08/10 0700 In: 663 [P.O.:660; I.V.:3] Out: -   Intake/Output from this shift:    Medications: Current Facility-Administered Medications  Medication Dose Route Frequency Provider Last Rate Last Dose  . 0.9 %  sodium chloride infusion   Intravenous Continuous Mihai Croitoru, MD 10 mL/hr at 05/22/13 0700    . ipratropium (ATROVENT) nebulizer solution 0.5 mg  0.5 mg Nebulization Q6H PRN Lauren Bajbus, RPH       And  . albuterol (PROVENTIL) (5 MG/ML) 0.5% nebulizer solution 2.5 mg  2.5 mg Nebulization Q6H PRN Lauren Bajbus, RPH      . antiseptic oral rinse (BIOTENE) solution 15 mL  15 mL Mouth Rinse BID Mihai Croitoru, MD   15 mL at 05/22/13 2030  . artificial tears (LACRILUBE) ophthalmic ointment 1 application  1 application Both Eyes QHS Wilburt Finlay, PA-C   1 application at 05/22/13 2109  . aspirin EC tablet 81 mg  81 mg Oral Daily Wilburt Finlay, PA-C   81 mg at 05/22/13 1033  . Chlorhexidine Gluconate Cloth 2 % PADS 6 each  6 each Topical Daily Thurmon Fair, MD   6 each at 05/22/13 1530  . citalopram (CELEXA) tablet 10 mg  10 mg Oral Daily Wilburt Finlay, PA-C   10 mg at 05/22/13 1033  . donepezil (ARICEPT) tablet 10 mg  10 mg Oral QHS Wilburt Finlay, PA-C   10 mg at 05/22/13 2117  . feeding supplement (ENSURE COMPLETE) liquid 237 mL  237 mL Oral BID BM Hettie Holstein, RD   237 mL at 05/22/13 1302  . furosemide (LASIX) tablet 20 mg  20 mg Oral BID Wilburt Finlay, PA-C   20 mg at 05/22/13 1719  . levothyroxine (SYNTHROID, LEVOTHROID) tablet 75 mcg  75 mcg Oral QAC breakfast Wilburt Finlay, PA-C   75 mcg at 05/22/13 0859  . losartan (COZAAR) tablet 50 mg  50 mg Oral Daily Wilburt Finlay, PA-C   50 mg at 05/22/13 1033  . multivitamin with minerals tablet 1 tablet  1 tablet Oral Daily Hettie Holstein, RD   1 tablet at 05/22/13 1305  . mupirocin ointment (BACTROBAN) 2 % 1 application  1 application Nasal BID Thurmon Fair, MD   1 application at 05/22/13 2109  . mupirocin ointment (BACTROBAN) 2 %   Nasal BID Mihai Croitoru, MD      . pantoprazole (PROTONIX) EC tablet 40 mg  40 mg Oral Daily Wilburt Finlay, PA-C   40 mg at 05/22/13 1034  . senna (SENOKOT) tablet 17.2 mg  2 tablet Oral QHS Wilburt Finlay, PA-C   17.2 mg at 05/22/13 2117  . sodium chloride 0.9 % injection 3 mL  3 mL Intravenous Q12H Wilburt Finlay, PA-C   3 mL at 05/22/13 2108  . tiotropium (SPIRIVA) inhalation capsule 18 mcg  18 mcg Inhalation QHS Wilburt Finlay, PA-C   18 mcg at 05/22/13 1945  . traMADol (ULTRAM) tablet 25 mg  25 mg Oral Q12H PRN Wilburt Finlay, PA-C        Physical Exam: General appearance: alert and no distress Neck: no adenopathy, no carotid bruit, no JVD, supple, symmetrical, trachea midline and thyroid not enlarged, symmetric, no tenderness/mass/nodules Lungs: clear to auscultation bilaterally Heart: regular rate and rhythm Abdomen: soft, non-tender; bowel sounds normal; no masses,  no organomegaly Extremities: extremities normal, atraumatic, no cyanosis or edema Pulses: 2+ and symmetric Skin: Skin color, texture, turgor normal. No rashes or lesions Neurologic: Mental status: Somnolent, awakens to stimuli, cannot hear well but conversant  Lab Results: Results for orders placed during the hospital encounter of 05/21/13 (from the past 48 hour(s))  SURGICAL PCR SCREEN     Status:  Abnormal   Collection Time    05/21/13 10:39 AM      Result Value Range   MRSA, PCR NEGATIVE  NEGATIVE   Staphylococcus aureus POSITIVE (*) NEGATIVE   Comment:            The Xpert SA Assay (FDA     approved for NASAL specimens     in patients over 23 years of age),     is one component of     a comprehensive surveillance     program.  Test performance has     been validated by [employer] for patients greater     than or equal to 10 year old.     It is not intended     to diagnose infection nor to     guide or monitor treatment.  CBC     Status: None   Collection Time    05/21/13 11:00 AM      Result Value Range   WBC 5.3  4.0 - 10.5 K/uL   RBC 3.90  3.87 - 5.11 MIL/uL   Hemoglobin 12.0  12.0 - 15.0 g/dL   HCT 40.9  81.1 - 91.4 %   MCV 92.6  78.0 - 100.0 fL   MCH 30.8  26.0 - 34.0 pg   MCHC 33.2  30.0 - 36.0 g/dL   RDW 78.2  95.6 - 21.3 %   Platelets 215  150 - 400 K/uL  PROTIME-INR     Status: Abnormal   Collection Time    05/21/13 11:00 AM      Result Value Range   Prothrombin Time 31.4 (*) 11.6 - 15.2 seconds   INR 3.17 (*) 0.00 - 1.49  BASIC METABOLIC PANEL     Status: Abnormal   Collection Time    05/21/13 11:00 AM      Result Value Range   Sodium 141  135 - 145 mEq/L   Potassium 3.9  3.5 - 5.1 mEq/L   Chloride 102  96 - 112 mEq/L   CO2 32  19 - 32 mEq/L   Glucose, Bld 87  70 - 99 mg/dL   BUN 28 (*) 6 - 23 mg/dL   Creatinine, Ser 0.86 (*) 0.50 - 1.10 mg/dL   Calcium 57.8  8.4 - 46.9 mg/dL   GFR calc non Af Amer 34 (*) >90 mL/min   GFR calc Af Amer 39 (*) >90 mL/min   Comment:            The eGFR has been calculated     using the CKD EPI equation.  This calculation has not been     validated in all clinical     situations.     eGFR's persistently     <90 mL/min signify     possible Chronic Kidney Disease.  PROTIME-INR     Status: Abnormal   Collection Time    05/22/13  2:51 AM      Result Value Range   Prothrombin Time 31.2 (*) 11.6 - 15.2  seconds   INR 3.15 (*) 0.00 - 1.49  PROTIME-INR     Status: Abnormal   Collection Time    05/23/13  5:30 AM      Result Value Range   Prothrombin Time 24.6 (*) 11.6 - 15.2 seconds   INR 2.31 (*) 0.00 - 1.49    Imaging: No results found.  Assessment:  1. Principal Problem: 2.   Pacemaker end of life 3. Active Problems: 4.   Coronary artery disease 5.   Chronic anticoagulation 6. NSVT  Plan:  1. Mrs. Berrocal appears to be hemodynamically stable. She had a slow 8-beat run of NSVT overnight which was probably well tolerated. She has no complaints, but seems to sleep most of the day. Appetite is poor if she is not pushed to eat. Plan for generator change tomorrow per Dr. Salena Saner.  NPO p MN. Pacer orders placed.  Time Spent Directly with Patient:  15 minutes  Length of Stay:  LOS: 2 days   Chrystie Nose, MD, [facility] Attending Cardiologist [facility]  Melesa Lecy C 05/23/2013, 9:00 AM

## 2013-05-23 NOTE — Progress Notes (Addendum)
ANTICOAGULATION CONSULT NOTE - Follow Up Consult  Pharmacy Consult for Heparin Indication: MVR, afib, CVA  Allergies  Allergen Reactions  . Advil (Ibuprofen) Other (See Comments)    "think it just doesn't work for her"  . Alprazolam Other (See Comments)    "don't remember what happens"  . Relafen (Nabumetone) Other (See Comments)    "don't remember what happens"  . Sulfa Antibiotics Other (See Comments)    "don't remember what happens"  . Toprol Xl (Metoprolol Succinate) Other (See Comments)    25mg ; "don't remember what happens"    Patient Measurements: Height: 5\' 2"  (157.5 cm) Weight: 125 lb 8 oz (56.926 kg) IBW/kg (Calculated) : 50.1 Heparin Dosing Weight: 56.9kg  Vital Signs: Temp: 98.1 F (36.7 C) (08/10 1538) Temp src: Oral (08/10 1538) BP: 114/50 mmHg (08/10 2000) Pulse Rate: 38 (08/10 2018)  Labs:  Recent Labs  05/21/13 1100 05/22/13 0251 05/23/13 0530 05/23/13 1951  HGB 12.0  --   --   --   HCT 36.1  --   --   --   PLT 215  --   --   --   LABPROT 31.4* 31.2* 24.6*  --   INR 3.17* 3.15* 2.31*  --   HEPARINUNFRC  --   --   --  0.73*  CREATININE 1.37*  --   --   --     Estimated Creatinine Clearance: 22.9 ml/min (by C-G formula based on Cr of 1.37).  Assessment: 89yof on coumadin pta for mitral valve replacement as well as hx afib and CVA. Admitted for pacemaker generator change out. Coumadin placed on hold and pharmacy received orders to begin heparin once INR < 2.5. INR today is 2.31 so Heparin bridge was started. Plan is for generator change tomorrow 8/11.  Initial heparin level is supratherapeutic.  Goal of Therapy:  Heparin level 0.3-0.7 units/ml INR 2.5-3.5 Monitor platelets by anticoagulation protocol: Yes   Plan:  Decrease Heparin to 600 units/hr Heparin stops at 0300 8/11 for generator change.  Will follow-up post-procedure for anticoagulation needs.  Estella Husk, Pharm.D., BCPS, AAHIVP Clinical Pharmacist Phone: [phone] or  [phone] 05/23/2013, 8:22 PM

## 2013-05-24 ENCOUNTER — Encounter (HOSPITAL_COMMUNITY)
Admission: RE | Disposition: A | Discharge status: Skilled Nursing Facility | Payer: Self-pay | Source: Ambulatory Visit | Attending: Cardiovascular Disease

## 2013-05-24 ENCOUNTER — Ambulatory Visit (HOSPITAL_COMMUNITY): Admit: 2013-05-24 | Payer: Self-pay | Admitting: Cardiovascular Disease

## 2013-05-24 HISTORY — PX: PACEMAKER GENERATOR CHANGE: SHX5481

## 2013-05-24 LAB — HEPARIN LEVEL (UNFRACTIONATED): Heparin Unfractionated: 0.55 IU/mL (ref 0.30–0.70)

## 2013-05-24 LAB — PROTIME-INR
INR: 1.75 — ABNORMAL HIGH (ref 0.00–1.49)
Prothrombin Time: 19.9 seconds — ABNORMAL HIGH (ref 11.6–15.2)

## 2013-05-24 SURGERY — PACEMAKER GENERATOR CHANGE
Anesthesia: Moderate Sedation | Laterality: Left

## 2013-05-24 SURGERY — PACEMAKER GENERATOR CHANGE
Anesthesia: LOCAL

## 2013-05-24 MED ORDER — CEFAZOLIN SODIUM 1-5 GM-% IV SOLN
1.0000 g | Freq: Four times a day (QID) | INTRAVENOUS | Status: DC
  Filled 2013-05-24 (×3): qty 50

## 2013-05-24 MED ORDER — LIDOCAINE HCL (PF) 1 % IJ SOLN
INTRAMUSCULAR | Status: AC
  Filled 2013-05-24: qty 30

## 2013-05-24 MED ORDER — LIDOCAINE HCL (PF) 1 % IJ SOLN
INTRAMUSCULAR | Status: AC
  Filled 2013-05-24: qty 90

## 2013-05-24 MED ORDER — ONDANSETRON HCL 4 MG/2ML IJ SOLN: 4.0000 mg | Freq: Four times a day (QID) | INTRAMUSCULAR | Status: DC | PRN

## 2013-05-24 MED ORDER — HEPARIN (PORCINE) IN NACL 100-0.45 UNIT/ML-% IJ SOLN
500.0000 [IU]/h | INTRAMUSCULAR | Status: DC
  Administered 2013-05-25: 600 [IU]/h via INTRAVENOUS
  Filled 2013-05-24 (×3): qty 250

## 2013-05-24 MED ORDER — WARFARIN - PHARMACIST DOSING INPATIENT
Freq: Every day | Status: DC
  Administered 2013-05-25 – 2013-05-26 (×2)

## 2013-05-24 MED ORDER — CEFAZOLIN SODIUM 1-5 GM-% IV SOLN
1.0000 g | Freq: Two times a day (BID) | INTRAVENOUS | Status: AC
  Administered 2013-05-24: 1 g via INTRAVENOUS
  Filled 2013-05-24: qty 50

## 2013-05-24 MED ORDER — SODIUM CHLORIDE 0.9 % IV SOLN
INTRAVENOUS | Status: AC
  Administered 2013-05-24: 09:00:00 via INTRAVENOUS

## 2013-05-24 MED ORDER — HEPARIN (PORCINE) IN NACL 100-0.45 UNIT/ML-% IJ SOLN
600.0000 [IU]/h | INTRAMUSCULAR | Status: DC
  Administered 2013-05-24: 600 [IU]/h via INTRAVENOUS
  Filled 2013-05-24: qty 250

## 2013-05-24 MED ORDER — WARFARIN SODIUM 5 MG PO TABS
5.0000 mg | ORAL_TABLET | Freq: Once | ORAL | Status: AC
  Administered 2013-05-24: 5 mg via ORAL
  Filled 2013-05-24: qty 1

## 2013-05-24 MED ORDER — HEPARIN (PORCINE) IN NACL 2-0.9 UNIT/ML-% IJ SOLN
INTRAMUSCULAR | Status: AC
  Filled 2013-05-24: qty 500

## 2013-05-24 MED ORDER — ACETAMINOPHEN 325 MG PO TABS
325.0000 mg | ORAL_TABLET | ORAL | Status: DC | PRN
  Administered 2013-05-25 – 2013-05-27 (×3): 650 mg via ORAL
  Filled 2013-05-24 (×3): qty 2

## 2013-05-24 NOTE — Progress Notes (Signed)
  ANTICOAGULATION CONSULT NOTE - Follow Up Consult  Pharmacy Consult for Heparin Indication: atrial fibrillation and prior CVA, mechanical mitral valve  Allergies  Allergen Reactions  . Advil (Ibuprofen) Other (See Comments)    "think it just doesn't work for her"  . Alprazolam Other (See Comments)    "don't remember what happens"  . Relafen (Nabumetone) Other (See Comments)    "don't remember what happens"  . Sulfa Antibiotics Other (See Comments)    "don't remember what happens"  . Toprol Xl (Metoprolol Succinate) Other (See Comments)    25mg ; "don't remember what happens"    Patient Measurements: Height: 5\' 2"  (157.5 cm) Weight: 126 lb 9.6 oz (57.425 kg) IBW/kg (Calculated) : 50.1 Heparin Dosing Weight: 57kg  Vital Signs: Temp: 98.3 F (36.8 C) (08/11 1320) Temp src: Oral (08/11 1320) BP: 124/61 mmHg (08/11 1320) Pulse Rate: 75 (08/11 1320)  Labs:  Recent Labs  05/22/13 0251 05/23/13 0530 05/23/13 1951 05/24/13 0450 05/24/13 1957  LABPROT 31.2* 24.6*  --  19.9*  --   INR 3.15* 2.31*  --  1.75*  --   HEPARINUNFRC  --   --  0.73*  --  0.55    Estimated Creatinine Clearance: 22.9 ml/min (by C-G formula based on Cr of 1.37).   Medications:  Heparin 600 units/hr  Assessment: [name] s/p pacemaker procedure on heparin for hx Afib/CVA/MVR. Heparin level (0.55) is now therapeutic after restart - will continue current rate and follow-up AM heparin level and INR (orders to continue heparin until INR > 2). - No significant bleeding reported  Goal of Therapy:  Heparin level 0.3-0.7 units/ml Monitor platelets by anticoagulation protocol: Yes   Plan:  1. Continue heparin drip 600 units/hr (6 ml/hr) 2. Follow-up AM heparin level, CBC and INR  Cleon Dew [phone] 05/24/2013,8:46 PM

## 2013-05-24 NOTE — Op Note (Signed)
Procedure report  Procedure performed:  1. Dual chamber pacemaker generator changeout  Reason for procedure:  1. Device generator at end of life 2. Complete heart block Procedure performed by:  Thurmon Fair, MD  Complications:  None  Estimated blood loss:  <5 mL  Medications administered during procedure:  Ancef 2 g intravenously, Vancomycin 1 g intravenously, lidocaine 1% 30 mL locally Device details:  Scientist, forensic AFx DR model number [id number], serial number [id number] Right atrial lead (chronic) St. Jude, model number 1342-46, serial [id number] (implanted 09/11/2000) Right ventricular lead (chronic)  St Jude, model number 1336T-52, serial number [id number] (implanted 09/11/2000)  Explanted generator St Jude Assurity,  model number [id number], serial number  [id number] (implanted 09/11/2000)  Procedure details:  After the risks and benefits of the procedure were discussed the patient provided informed consent. She was brought to the cardiac catheter lab in the fasting state. The patient was prepped and draped in usual sterile fashion. Local anesthesia with 1% lidocaine was administered to to the left infraclavicular area. A 5-6cm horizontal incision was made parallel with and 2-3 cm caudal to the righ clavicle, in the area of an old scar. An older scar was seen closer to the right clavicle. Using minimal electrocautery and mostly sharp and blunt dissection the prepectoral pocket was opened carefully to avoid injury to the loops of chronic leads. Extensive dissection was not necessary. The device was explanted. The pocket was carefully inspected for hemostasis and flushed with copious amounts of antibiotic solution.  The leads were disconnected from the old generator and testing of the lead parameters later showed excellent values. Atrial fibrillation was noted, limiting the atrial lead evaluation. The new generator was connected to the chronic leads, with appropriate pacing noted.    The entire system was then carefully inserted in the pocket with care been taking that the leads and device assumed a comfortable position without pressure on the incision. Great care was taken that the leads be located deep to the generator. The pocket was then closed in layers using 2 layers of 2-0 Vicryl and cutaneous staples after which a sterile dressing was applied.   At the end of the procedure the following lead parameters were encountered:   Right atrial lead sensed P waves 0.5 mV Right ventricular lead sensed R waves  18.9 mV, impedance 617 ohms, threshold 0.8 at 0.4 ms pulse width  Thurmon Fair, MD, [facility] [phone] office [phone] pager

## 2013-05-24 NOTE — Progress Notes (Signed)
Clinical Social Work Department BRIEF PSYCHOSOCIAL ASSESSMENT 05/24/2013  Patient:  [name], [name]     Account Number:  [id number]     Admit date:  05/21/2013  Clinical Social Worker:  Carren Rang  Date/Time:  05/24/2013 11:06 AM  Referred by:  Physician  Date Referred:  05/23/2013 Referred for  SNF Placement   Other Referral:   Interview type:  Family Other interview type:    PSYCHOSOCIAL DATA Living Status:  FACILITY Admitted from facility:  [facility] Level of care:  Skilled Nursing Facility Primary support name:  [name] Primary support relationship to patient:  CHILD, ADULT Degree of support available:   Good    CURRENT CONCERNS Current Concerns  Post-Acute Placement   Other Concerns:    SOCIAL WORK ASSESSMENT / PLAN Clinical Social Worker received referral for SNF placement at d/c. CSW spoke with daughter, [name] who states that patient is from [facility] and hopes to return back after dc. CSW spoke with [facility] who confirmed bed upon dc. Daughter [name] stated that she needs to get back to work tomorrow, so is wanting non emergency transport for patient. CSW will complete FL2 for MD's signature and will update family when dc order is ready.   Assessment/plan status:  Psychosocial Support/Ongoing Assessment of Needs Other assessment/ plan:   Information/referral to community resources:   SNF process/[facility]    PATIENT'S/FAMILY'S RESPONSE TO PLAN OF CARE: Patient's daughter was by bedside and stated that she is hoping [facility] will take her mother back. CSW spoke with [facility] who confirmed that patient can return. CSW informed daughter [name]. [name] stated that she is happy about that and will call [facility] today to update them on her mom.    [name], MSW, [facility] [phone]

## 2013-05-24 NOTE — Progress Notes (Signed)
FL2 on chart for MD to sign so she can be d/c tomorrow back to [place].   Maree Krabbe, MSW, [facility] [phone]

## 2013-05-24 NOTE — Progress Notes (Signed)
ANTICOAGULATION CONSULT NOTE - Follow Up Consult  Pharmacy Consult for Heparin and Coumadin Indication: atrial fibrillation and prior CVA, mechanical mitral valve  Allergies  Allergen Reactions  . Advil (Ibuprofen) Other (See Comments)    "think it just doesn't work for her"  . Alprazolam Other (See Comments)    "don't remember what happens"  . Relafen (Nabumetone) Other (See Comments)    "don't remember what happens"  . Sulfa Antibiotics Other (See Comments)    "don't remember what happens"  . Toprol Xl (Metoprolol Succinate) Other (See Comments)    25mg ; "don't remember what happens"    Patient Measurements: Height: 5\' 2"  (157.5 cm) Weight: 126 lb 9.6 oz (57.425 kg) IBW/kg (Calculated) : 50.1 Heparin Dosing Weight: 57 kg  Vital Signs: Temp: 97.3 F (36.3 C) (08/11 0903) Temp src: Oral (08/11 0903) BP: 145/74 mmHg (08/11 0945) Pulse Rate: 74 (08/11 0945)  Labs:  Recent Labs  05/21/13 1100 05/22/13 0251 05/23/13 0530 05/23/13 1951 05/24/13 0450  HGB 12.0  --   --   --   --   HCT 36.1  --   --   --   --   PLT 215  --   --   --   --   LABPROT 31.4* 31.2* 24.6*  --  19.9*  INR 3.17* 3.15* 2.31*  --  1.75*  HEPARINUNFRC  --   --   --  0.73*  --   CREATININE 1.37*  --   --   --   --     Estimated Creatinine Clearance: 22.9 ml/min (by C-G formula based on Cr of 1.37).  Assessment:   Heparin level 0.73 on 700 units/hr on 8/10 pm, decreased to 600 units/hr. Heparin drip off early this morning for pacemaker change. Spoke briefly with Dr. Royann Shivers. Heparin to resume 4 hours post-procedure.   Coumadin also to resume tonight.  Last dose 8/7, was on Jantoven 3 mg daily per facility records.    INR 3.17 on admit, now 1.75.   Pre-procedure Ancef 2 grams.  Post-op Ancef 1 gram IV q6hrs x 3 ordered. Crcl ~20-25 ml/min.  OK for Rx to adjust per Dr. Royann Shivers.  Goal of Therapy:  Heparin level 0.3-0.7 units/ml INR 2.5-3.5 Monitor platelets by anticoagulation protocol: Yes    Plan:   Resume heparin drip at 600 units/hr at 12:30pm.  Will check heparin level ~8hrs after drip resumes.  Coumadin 5 mg x 1 tonight.  Daily heparin level, PT/INR and CBC.   Ancef 1 gram IV x 1 ~12hrs post-procedure.  Dennie Fetters, RPh Pager: [phone] 05/24/2013,10:14 AM

## 2013-05-25 ENCOUNTER — Encounter: Payer: Self-pay | Admitting: Cardiovascular Disease

## 2013-05-25 DIAGNOSIS — I2789 Other specified pulmonary heart diseases: Secondary | ICD-10-CM

## 2013-05-25 LAB — HEPARIN LEVEL (UNFRACTIONATED): Heparin Unfractionated: 0.49 IU/mL (ref 0.30–0.70)

## 2013-05-25 LAB — CBC
HCT: 33.4 % — ABNORMAL LOW (ref 36.0–46.0)
Platelets: 179 10*3/uL (ref 150–400)
RDW: 13.3 % (ref 11.5–15.5)
WBC: 4.9 10*3/uL (ref 4.0–10.5)

## 2013-05-25 LAB — PROTIME-INR: INR: 1.68 — ABNORMAL HIGH (ref 0.00–1.49)

## 2013-05-25 MED ORDER — WARFARIN SODIUM 5 MG PO TABS
5.0000 mg | ORAL_TABLET | Freq: Once | ORAL | Status: AC
  Administered 2013-05-25: 5 mg via ORAL
  Filled 2013-05-25: qty 1

## 2013-05-25 NOTE — Care Management Note (Signed)
    Page 1 of 1   05/28/2013     3:51:03 PM   CARE MANAGEMENT NOTE 05/28/2013  Patient:  [name], [name]   Account Number:  [id number]  Date Initiated:  05/25/2013  Documentation initiated by:  Tinesha Siegrist  Subjective/Objective Assessment:   PT ADM FOR EOL PACEMAKER CHANGE OUT ON 05/21/13.  PTA, PT RESIDED AT [facility] SN[facility].     Action/Plan:   CSW CONSULTED TO FACILITATE DC BACK TO SNF WHEN MEDICALLY STABLE.  WILL FOLLOW PROGRESS.   Anticipated DC Date:  05/26/2013   Anticipated DC Plan:  SKILLED NURSING FACILITY  In-house referral  Clinical Social Worker      DC Planning Services  CM consult      Choice offered to / List presented to:             Status of service:  Completed, signed off Medicare Important Message given?   (If response is "NO", the following Medicare IM given date fields will be blank) Date Medicare IM given:   Date Additional Medicare IM given:    Discharge Disposition:  SKILLED NURSING FACILITY  Per UR Regulation:  Reviewed for med. necessity/level of care/duration of stay  If discussed at Long Length of Stay Meetings, dates discussed:   05/27/2013    Comments:  05/28/13 Armina Galloway,RN,BSN [phone] PT DISCHARGED TO SNF TODAY, PER CSW ARRANGEMENTS.

## 2013-05-25 NOTE — Progress Notes (Signed)
[facility]  Subjective: Pt endorsing soreness around incision site.  Objective: Vital signs in last 24 hours: Temp:  [97.4 F (36.3 C)-98.8 F (37.1 C)] 97.4 F (36.3 C) (08/12 0428) Pulse Rate:  [72-91] 80 (08/12 0428) Resp:  [18] 18 (08/12 0428) BP: (109-155)/(59-101) 137/65 mmHg (08/12 0428) SpO2:  [98 %-100 %] 100 % (08/12 0428) Weight:  [122 lb 2.2 oz (55.4 kg)] 122 lb 2.2 oz (55.4 kg) (08/12 0428) Last BM Date: 05/24/13  Intake/Output from previous day: 08/11 0701 - 08/12 0700 In: 1751.3 [P.O.:820; I.V.:881.3; IV Piggyback:50] Out: -  Intake/Output this shift:    Medications Current Facility-Administered Medications  Medication Dose Route Frequency Provider Last Rate Last Dose  . 0.9 %  sodium chloride infusion   Intravenous Continuous Thurmon Fair, MD 20 mL/hr at 05/25/13 0558    . acetaminophen (TYLENOL) tablet 325-650 mg  325-650 mg Oral Q4H PRN Yanil Dawe, MD      . ipratropium (ATROVENT) nebulizer solution 0.5 mg  0.5 mg Nebulization Q6H PRN Lauren Bajbus, RPH       And  . albuterol (PROVENTIL) (5 MG/ML) 0.5% nebulizer solution 2.5 mg  2.5 mg Nebulization Q6H PRN Lauren Bajbus, RPH      . antiseptic oral rinse (BIOTENE) solution 15 mL  15 mL Mouth Rinse BID Thurmon Fair, MD   15 mL at 05/24/13 2003  . artificial tears (LACRILUBE) ophthalmic ointment 1 application  1 application Both Eyes QHS Wilburt Finlay, PA-C   1 application at 05/24/13 2242  . aspirin EC tablet 81 mg  81 mg Oral Daily Wilburt Finlay, PA-C   81 mg at 05/24/13 1154  . Chlorhexidine Gluconate Cloth 2 % PADS 6 each  6 each Topical Daily Thurmon Fair, MD   6 each at 05/24/13 0640  . citalopram (CELEXA) tablet 10 mg  10 mg Oral Daily Wilburt Finlay, PA-C   10 mg at 05/24/13 1154  . donepezil (ARICEPT) tablet 10 mg  10 mg Oral QHS Wilburt Finlay, PA-C   10 mg at 05/24/13 2242  . feeding supplement (ENSURE COMPLETE) liquid 237 mL  237 mL Oral BID BM Hettie Holstein, RD   237 mL at 05/24/13 1400  . furosemide (LASIX) tablet 20 mg  20 mg Oral BID Wilburt Finlay, PA-C   20 mg at 05/24/13 1630  . heparin ADULT infusion 100 units/mL (25000 units/250 mL)  600 Units/hr Intravenous Continuous Cleon Dew, RPH      . levothyroxine (SYNTHROID, LEVOTHROID) tablet 75 mcg  75 mcg Oral QAC breakfast Wilburt Finlay, PA-C   75 mcg at 05/23/13 4098  . losartan (COZAAR) tablet 50 mg  50 mg Oral Daily Wilburt Finlay, PA-C   50 mg at 05/24/13 1154  . multivitamin with minerals tablet 1 tablet  1 tablet Oral Daily Hettie Holstein, RD   1 tablet at 05/24/13 1154  . mupirocin ointment (BACTROBAN) 2 % 1 application  1 application Nasal BID Thurmon Fair, MD   1 application at 05/24/13 2157  . mupirocin ointment (BACTROBAN) 2 %   Nasal BID Vicke Plotner, MD      . ondansetron (ZOFRAN) injection 4 mg  4 mg Intravenous Q6H PRN Iasha Mccalister, MD      . pantoprazole (PROTONIX) EC tablet 40 mg  40 mg Oral Daily Wilburt Finlay, PA-C   40 mg at 05/24/13 1202  . senna (SENOKOT) tablet 17.2 mg  2 tablet Oral QHS Wilburt Finlay, PA-C   17.2  mg at 05/23/13 2112  . sodium chloride 0.9 % injection 3 mL  3 mL Intravenous Q12H Wilburt Finlay, PA-C   3 mL at 05/23/13 0941  . tiotropium (SPIRIVA) inhalation capsule 18 mcg  18 mcg Inhalation QHS Wilburt Finlay, PA-C   18 mcg at 05/24/13 2035  . traMADol (ULTRAM) tablet 25 mg  25 mg Oral Q12H PRN Wilburt Finlay, PA-C      . Warfarin - Pharmacist Dosing Inpatient   Does not apply q1800 Dennie Fetters, [facility]        PE: General appearance: alert, cooperative and no distress Lungs: clear to auscultation bilaterally Heart: irregularly irregular rhythm Extremities: no LEE Pulses: 2+ and symmetric Skin: warm and dry Neurologic: Grossly normal  Lab Results:   Recent Labs  05/25/13 0545  WBC 4.9  HGB 10.9*  HCT 33.4*  PLT 179   BMET No results found for this basename: NA, K, CL, CO2, GLUCOSE, BUN, CREATININE, CALCIUM,  in the last 72  hours PT/INR  Recent Labs  05/23/13 0530 05/24/13 0450 05/25/13 0545  LABPROT 24.6* 19.9* 19.3*  INR 2.31* 1.75* 1.68*    Assessment/Plan  Principal Problem:   Pacemaker end of life Active Problems:   Coronary artery disease   Chronic anticoagulation   NSVT (nonsustained ventricular tachycardia)  Plan: S/p dual chamber pacemaker generator change-out. New [address] Jude Integrity AFx DR model number [id number], serial number [id number]. Appropriate pacing was noted post insertion. She is endorsing moderate pain near incision site. She has a moderate amount of bloody drainage. Dr. Royann Shivers to assess site. PRN tramadol is ordered. BP is improved and stable w/ most recent SBP in the 130s. INR is still subthearpeutic at 1.68 and slightly down from yesterday. Continue on coumadin with heparin bridge. Discharge once INR reaches therapeutic range.     LOS: 4 days    Brittainy M. Sharol Harness, PA-C 05/25/2013 9:20 AM  I have seen and examined the patient along with Brittainy M. Sharol Harness, PA-C.  I have reviewed the chart, notes and new data.  I agree with PA's note.  Key new complaints: mild soreness, not particularly tender Key examination changes: no hematoma, small amount of ongoing oozing appears to be superficial in source. Key new findings / data: INR stopped falling at 1.68, expect will rebound up tomorrow  PLAN: DC to nursing facility once INR >2, keep INR 2-2.5 until oozing stops.  Thurmon Fair, MD, [facility] [phone] 05/25/2013, 10:22 AM

## 2013-05-25 NOTE — Progress Notes (Signed)
CSW spoke with daughter Nathalia and [facility] to let them know d/c will not be today, possibly tomorrow. Bed still confirmed at [facility]. Will update family and facility tomorrow.  Maree Krabbe, MSW, [facility] [phone]

## 2013-05-25 NOTE — Progress Notes (Signed)
ANTICOAGULATION CONSULT NOTE - Follow Up Consult  Pharmacy Consult for Heparin + Coumadin Indication: atrial fibrillation and prior CVA, mechanical mitral valve  Allergies  Allergen Reactions  . Advil (Ibuprofen) Other (See Comments)    "think it just doesn't work for her"  . Alprazolam Other (See Comments)    "don't remember what happens"  . Relafen (Nabumetone) Other (See Comments)    "don't remember what happens"  . Sulfa Antibiotics Other (See Comments)    "don't remember what happens"  . Toprol Xl (Metoprolol Succinate) Other (See Comments)    25mg ; "don't remember what happens"    Patient Measurements: Height: 5\' 2"  (157.5 cm) Weight: 122 lb 2.2 oz (55.4 kg) IBW/kg (Calculated) : 50.1 Heparin Dosing Weight: 55kg  Vital Signs: Temp: 98.1 F (36.7 C) (08/12 1338) Temp src: Oral (08/12 1338) BP: 100/67 mmHg (08/12 1338) Pulse Rate: 76 (08/12 1338)  Labs:  Recent Labs  05/23/13 0530 05/23/13 1951 05/24/13 0450 05/24/13 1957 05/25/13 0545  HGB  --   --   --   --  10.9*  HCT  --   --   --   --  33.4*  PLT  --   --   --   --  179  LABPROT 24.6*  --  19.9*  --  19.3*  INR 2.31*  --  1.75*  --  1.68*  HEPARINUNFRC  --  0.73*  --  0.55 0.49    Estimated Creatinine Clearance: 22.9 ml/min (by C-G formula based on Cr of 1.37).   Medications:  Heparin @ 600 units/hr  Assessment: 89yof resumed on heparin and coumadin yesterday s/p pacemaker change out. Heparin level is therapeutic today. INR remains subtherapeutic. Patient having some bleeding at incision site - no hematoma and appears to be superficial per MD note. Dr. Salena Saner requesting to keep INR 2-2.5 until oozing stops, so will not increase coumadin dose tonight. CBC stable.  Goal of Therapy:  Heparin level 0.3-0.7 INR 2-2.5 Monitor platelets by anticoagulation protocol: Yes   Plan:  1) Continue heparin at 600 units/hr 2) Repeat coumadin 5mg  x 1 3) INR, heparin level, CBC in AM 4) Follow up  bleeding  Fredrik Rigger 05/25/2013,2:59 PM

## 2013-05-26 LAB — CBC
HCT: 30.7 % — ABNORMAL LOW (ref 36.0–46.0)
Hemoglobin: 10.2 g/dL — ABNORMAL LOW (ref 12.0–15.0)
MCH: 30.5 pg (ref 26.0–34.0)
MCHC: 33.2 g/dL (ref 30.0–36.0)
MCV: 91.9 fL (ref 78.0–100.0)
RDW: 13.3 % (ref 11.5–15.5)

## 2013-05-26 LAB — HEPARIN LEVEL (UNFRACTIONATED): Heparin Unfractionated: 0.77 IU/mL — ABNORMAL HIGH (ref 0.30–0.70)

## 2013-05-26 MED ORDER — WARFARIN SODIUM 4 MG PO TABS
4.0000 mg | ORAL_TABLET | Freq: Once | ORAL | Status: AC
  Administered 2013-05-26: 4 mg via ORAL
  Filled 2013-05-26: qty 1

## 2013-05-26 MED ORDER — HEPARIN (PORCINE) IN NACL 100-0.45 UNIT/ML-% IJ SOLN
500.0000 [IU]/h | INTRAMUSCULAR | Status: DC
  Administered 2013-05-27: 450 [IU]/h via INTRAVENOUS
  Filled 2013-05-26 (×2): qty 250

## 2013-05-26 NOTE — Progress Notes (Signed)
CSW updated facility, [facility] about possible [employer]. Will update further tomorrow.  Maree Krabbe, MSW, [facility] [phone]

## 2013-05-26 NOTE — Progress Notes (Signed)
ANTICOAGULATION CONSULT NOTE - Follow Up Consult  Pharmacy Consult for Heparin, Coumadin Indication: St. Jude prosthetic Mitral Valve  Allergies  Allergen Reactions  . Advil [Ibuprofen] Other (See Comments)    "think it just doesn't work for her"  . Alprazolam Other (See Comments)    "don't remember what happens"  . Relafen [Nabumetone] Other (See Comments)    "don't remember what happens"  . Sulfa Antibiotics Other (See Comments)    "don't remember what happens"  . Toprol Xl [Metoprolol Succinate] Other (See Comments)    25mg ; "don't remember what happens"    Patient Measurements: Height: 5\' 2"  (157.5 cm) Weight: 123 lb 3.8 oz (55.9 kg) IBW/kg (Calculated) : 50.1 Heparin Dosing Weight:   Vital Signs: Temp: 98.5 F (36.9 C) (08/13 0503) Temp src: Oral (08/13 0503) BP: 120/73 mmHg (08/13 0503) Pulse Rate: 79 (08/13 0503)  Labs:  Recent Labs  05/24/13 0450 05/24/13 1957 05/25/13 0545 05/26/13 0450  HGB  --   --  10.9* 10.2*  HCT  --   --  33.4* 30.7*  PLT  --   --  179 169  LABPROT 19.9*  --  19.3* 20.2*  INR 1.75*  --  1.68* 1.78*  HEPARINUNFRC  --  0.55 0.49 0.77*    Estimated Creatinine Clearance: 22.9 ml/min (by C-G formula based on Cr of 1.37).   Medications:  Scheduled:  . antiseptic oral rinse  15 mL Mouth Rinse BID  . artificial tears  1 application Both Eyes QHS  . aspirin EC  81 mg Oral Daily  . Chlorhexidine Gluconate Cloth  6 each Topical Daily  . citalopram  10 mg Oral Daily  . donepezil  10 mg Oral QHS  . feeding supplement  237 mL Oral BID BM  . furosemide  20 mg Oral BID  . levothyroxine  75 mcg Oral QAC breakfast  . losartan  50 mg Oral Daily  . multivitamin with minerals  1 tablet Oral Daily  . mupirocin ointment  1 application Nasal BID  . mupirocin ointment   Nasal BID  . pantoprazole  40 mg Oral Daily  . senna  2 tablet Oral QHS  . sodium chloride  3 mL Intravenous Q12H  . tiotropium  18 mcg Inhalation QHS  . Warfarin -  Pharmacist Dosing Inpatient   Does not apply q1800    Assessment: 79yo female with prosthetic MVR on heparin bridge with Coumadin s/p recent pacer.  INR 1.78 this AM, Heparin level with large jump to 0.77 on 600 units/hr.  Hg and pltc are stable.  There is continued oozing at surgical site per d/w RN, therefore maintaining goal of 2-2.5 per MD order.  Goal of Therapy:  INR 2-2.5 until oozing stops Monitor platelets by anticoagulation protocol: Yes   Plan:  1.  Coumadin 4mg  today 2.  Decrease heparin to 500 units/hr 3.  Check Heparin level 6 hours  Marisue Humble, PharmD Clinical Pharmacist [facility]

## 2013-05-26 NOTE — Progress Notes (Signed)
Pt. Seen and examined. Agree with the NP/PA-C note as written.  She is status quo. Awaiting INR >2 prior to discharge back to SNF. No pacer site complications.   Chrystie Nose, MD, [facility] Attending Cardiologist [facility]

## 2013-05-26 NOTE — Progress Notes (Signed)
Subjective: No pain at pacer site.  Objective: Vital signs in last 24 hours: Temp:  [98.1 F (36.7 C)-98.5 F (36.9 C)] 98.5 F (36.9 C) (08/13 0503) Pulse Rate:  [74-79] 79 (08/13 0503) Resp:  [18] 18 (08/12 2109) BP: (97-120)/(61-73) 120/73 mmHg (08/13 0503) SpO2:  [100 %] 100 % (08/13 0503) Weight:  [123 lb 3.8 oz (55.9 kg)] 123 lb 3.8 oz (55.9 kg) (08/13 0503) Last BM Date: 05/25/13  Intake/Output from previous day: 08/12 0701 - 08/13 0700 In: 480 [P.O.:480] Out: -  Intake/Output this shift: Total I/O In: 240 [P.O.:240] Out: -   Medications Current Facility-Administered Medications  Medication Dose Route Frequency Provider Last Rate Last Dose  . 0.9 %  sodium chloride infusion   Intravenous Continuous Thurmon Fair, MD 20 mL/hr at 05/25/13 0558    . acetaminophen (TYLENOL) tablet 325-650 mg  325-650 mg Oral Q4H PRN Thurmon Fair, MD   650 mg at 05/25/13 1208  . ipratropium (ATROVENT) nebulizer solution 0.5 mg  0.5 mg Nebulization Q6H PRN Lauren Bajbus, RPH       And  . albuterol (PROVENTIL) (5 MG/ML) 0.5% nebulizer solution 2.5 mg  2.5 mg Nebulization Q6H PRN Lauren Bajbus, RPH      . antiseptic oral rinse (BIOTENE) solution 15 mL  15 mL Mouth Rinse BID Mihai Croitoru, MD   15 mL at 05/25/13 2000  . artificial tears (LACRILUBE) ophthalmic ointment 1 application  1 application Both Eyes QHS Wilburt Finlay, PA-C   1 application at 05/25/13 2148  . aspirin EC tablet 81 mg  81 mg Oral Daily Wilburt Finlay, PA-C   81 mg at 05/25/13 1140  . Chlorhexidine Gluconate Cloth 2 % PADS 6 each  6 each Topical Daily Thurmon Fair, MD   6 each at 05/24/13 0640  . citalopram (CELEXA) tablet 10 mg  10 mg Oral Daily Wilburt Finlay, PA-C   10 mg at 05/25/13 1140  . donepezil (ARICEPT) tablet 10 mg  10 mg Oral QHS Wilburt Finlay, PA-C   10 mg at 05/25/13 2147  . feeding supplement (ENSURE COMPLETE) liquid 237 mL  237 mL Oral BID BM Hettie Holstein, RD   237 mL at 05/25/13 1400  .  furosemide (LASIX) tablet 20 mg  20 mg Oral BID Wilburt Finlay, PA-C   20 mg at 05/25/13 1838  . heparin ADULT infusion 100 units/mL (25000 units/250 mL)  500 Units/hr Intravenous Continuous Renaee Munda, RPH 6 mL/hr at 05/25/13 1143 600 Units/hr at 05/25/13 1143  . levothyroxine (SYNTHROID, LEVOTHROID) tablet 75 mcg  75 mcg Oral QAC breakfast Wilburt Finlay, PA-C   75 mcg at 05/26/13 0900  . losartan (COZAAR) tablet 50 mg  50 mg Oral Daily Wilburt Finlay, PA-C   50 mg at 05/25/13 1140  . multivitamin with minerals tablet 1 tablet  1 tablet Oral Daily Hettie Holstein, RD   1 tablet at 05/25/13 1140  . mupirocin ointment (BACTROBAN) 2 % 1 application  1 application Nasal BID Thurmon Fair, MD   1 application at 05/25/13 2148  . mupirocin ointment (BACTROBAN) 2 %   Nasal BID Mihai Croitoru, MD      . ondansetron (ZOFRAN) injection 4 mg  4 mg Intravenous Q6H PRN Mihai Croitoru, MD      . pantoprazole (PROTONIX) EC tablet 40 mg  40 mg Oral Daily Wilburt Finlay, PA-C   40 mg at 05/25/13 1141  . senna (SENOKOT) tablet 17.2 mg  2 tablet Oral QHS Judie Grieve  Chaske Paskett, PA-C   17.2 mg at 05/25/13 2147  . sodium chloride 0.9 % injection 3 mL  3 mL Intravenous Q12H Wilburt Finlay, PA-C   3 mL at 05/25/13 1141  . tiotropium (SPIRIVA) inhalation capsule 18 mcg  18 mcg Inhalation QHS Wilburt Finlay, PA-C   18 mcg at 05/25/13 2109  . traMADol (ULTRAM) tablet 25 mg  25 mg Oral Q12H PRN Wilburt Finlay, PA-C   25 mg at 05/25/13 2146  . warfarin (COUMADIN) tablet 4 mg  4 mg Oral ONCE-1800 Kendra P Hiatt, RPH      . Warfarin - Pharmacist Dosing Inpatient   Does not apply q1800 Dennie Fetters, [facility]        PE: General appearance: alert, cooperative and no distress Lungs: clear to auscultation bilaterally Heart: regular rate and rhythm, S1, S2 normal, no murmur, click, rub or gallop Extremities: No LEE Pulses: 1+ and symmetric Skin: excessive drainage at pacer site.  dressing removed. No signs of infection.  Continues to ooze  slowly.   Neurologic: Grossly normal  Lab Results:   Recent Labs  05/25/13 0545 05/26/13 0450  WBC 4.9 5.1  HGB 10.9* 10.2*  HCT 33.4* 30.7*  PLT 179 169   BMET No results found for this basename: NA, K, CL, CO2, GLUCOSE, BUN, CREATININE, CALCIUM,  in the last 72 hours PT/INR  Recent Labs  05/24/13 0450 05/25/13 0545 05/26/13 0450  LABPROT 19.9* 19.3* 20.2*  INR 1.75* 1.68* 1.78*    Assessment/Plan    Principal Problem:   Pacemaker end of life Active Problems:   Chronic anticoagulation   Coronary artery disease   NSVT (nonsustained ventricular tachycardia)  Plan:  POD #2: S/p dual chamber pacemaker generator change-out. New [address] Jude Integrity AFx DR model number [id number], serial number [id number].  INR 1.78.  Expect to DC to SNF when INR > 2.0 and when oozing a pacer site stops..    LOS: 5 days    Jaylenn Baiza 05/26/2013 9:56 AM

## 2013-05-26 NOTE — Progress Notes (Signed)
ANTICOAGULATION CONSULT NOTE - Follow Up Consult  Pharmacy Consult for Heparin, Coumadin Indication: St. Jude prosthetic Mitral Valve  Patient Measurements: Height: 5\' 2"  (157.5 cm) Weight: 123 lb 3.8 oz (55.9 kg) IBW/kg (Calculated) : 50.1 Heparin Dosing Weight: 56kg  Vital Signs: Temp: 98 F (36.7 C) (08/13 1332) Temp src: Oral (08/13 1332) BP: 125/83 mmHg (08/13 1332) Pulse Rate: 101 (08/13 1332)  Labs:  Recent Labs  05/24/13 0450  05/25/13 0545 05/26/13 0450 05/26/13 1558  HGB  --   --  10.9* 10.2*  --   HCT  --   --  33.4* 30.7*  --   PLT  --   --  179 169  --   LABPROT 19.9*  --  19.3* 20.2*  --   INR 1.75*  --  1.68* 1.78*  --   HEPARINUNFRC  --   < > 0.49 0.77* 0.68  < > = values in this interval not displayed.  Estimated Creatinine Clearance: 22.9 ml/min (by C-G formula based on Cr of 1.37).  Assessment: 79yo female with prosthetic MVR on heparin bridge with Coumadin s/p recent pacer.  INR 1.78 this AM, Heparin level with large jump to 0.77 on 600 units/hr and is now down to 0.68 on repeat level, plt are stable.  There is continued oozing at surgical site per d/w RN, therefore maintaining goal of 2-2.5 per MD order.  Goal of Therapy:  INR 2-2.5 until oozing stops. Heparin level goal 0.3-0.7 Monitor platelets by anticoagulation protocol: Yes   Plan:  1.  Coumadin 4mg  today 2.  Decrease heparin to 450 units/hr 3.  Check Heparin level in am  Sheppard Coil PharmD., [facility] Clinical Pharmacist Pager [phone] 05/26/2013 5:19 PM

## 2013-05-27 DIAGNOSIS — R4182 Altered mental status, unspecified: Secondary | ICD-10-CM

## 2013-05-27 LAB — CBC
HCT: 35.2 % — ABNORMAL LOW (ref 36.0–46.0)
Hemoglobin: 11.6 g/dL — ABNORMAL LOW (ref 12.0–15.0)
MCH: 30.5 pg (ref 26.0–34.0)
MCV: 92.6 fL (ref 78.0–100.0)
RBC: 3.8 MIL/uL — ABNORMAL LOW (ref 3.87–5.11)
WBC: 5.9 10*3/uL (ref 4.0–10.5)

## 2013-05-27 MED ORDER — WARFARIN SODIUM 5 MG PO TABS
5.0000 mg | ORAL_TABLET | Freq: Once | ORAL | Status: AC
  Administered 2013-05-27: 5 mg via ORAL
  Filled 2013-05-27: qty 1

## 2013-05-27 NOTE — Progress Notes (Signed)
ANTICOAGULATION CONSULT NOTE - Follow Up Consult  Pharmacy Consult for Heparin and Coumadin Indication: atrial fibrillation and prior CVA, mechanical mitral valve  Patient Measurements: Height: 5\' 2"  (157.5 cm) Weight: 123 lb 3.8 oz (55.9 kg) IBW/kg (Calculated) : 50.1 Heparin Dosing Weight: 56 kg  Vital Signs: Temp: 98 F (36.7 C) (08/14 0526) Temp src: Oral (08/14 0526) BP: 113/68 mmHg (08/14 0526) Pulse Rate: 72 (08/14 0526)  Labs:  Recent Labs  05/25/13 0545 05/26/13 0450 05/26/13 1558 05/27/13 0530  HGB 10.9* 10.2*  --  11.6*  HCT 33.4* 30.7*  --  35.2*  PLT 179 169  --  193  LABPROT 19.3* 20.2*  --  21.0*  INR 1.68* 1.78*  --  1.87*  HEPARINUNFRC 0.49 0.77* 0.68 0.28*    Estimated Creatinine Clearance: 22.9 ml/min (by C-G formula based on Cr of 1.37).  Assessment:  Heparin level is just below therapeutic range (0.28) on 450 units/hr.  Pacer site oozing resolved.  CBC trended up.   INR increasing slowly towards current low goal (2-2.5).   Home Coumadin dose: 3 mg daily.   Has had 5 mg, 5 mg, then 4 mg, since Coumadin resumed on 8/11 post-procedure.  Goal of Therapy:  Heparin level 0.3-0.7 units/ml INR 2-2.5 (due to recent pacer site oozing) Monitor platelets by anticoagulation protocol: Yes   Plan:   Increase heparin drip to 500 units/hr.  Coumadin 5 mg today.  Continue daily heparin level, PT/INR and CBC.  Dennie Fetters, RPh Pager: [phone] 05/27/2013,10:12 AM

## 2013-05-27 NOTE — Progress Notes (Signed)
Pt. Seen and examined. Agree with the NP/PA-C note as written. Stable, INR still subtherapeutic, expect we will be there tomorrow for discharge back to SNF.  Chrystie Nose, MD, [facility] Attending Cardiologist [facility]

## 2013-05-27 NOTE — Progress Notes (Signed)
NUTRITION FOLLOW UP  Intervention:   1. Continue Ensure Complete  Nutrition Dx:   Inadequate oral intake related to chewing and swallowing difficulty as evidenced by weight loss.   Goal:   Intake to meet >/=90% estimated nutrition needs.   Monitor:   PO intake, weight trends, labs, I/O's  Assessment:   Pt s/p pacemaker generator change out. Now waiting on INR to return to >2 prior to returning to SNF.  Pt continues to eat ~50% meals. Endorses she is drinking Ensure supplements.   Height: Ht Readings from Last 1 Encounters:  05/21/13 5\' 2"  (1.575 m)    Weight Status:   Wt Readings from Last 1 Encounters:  05/26/13 123 lb 3.8 oz (55.9 kg)  Admission weight 112 lbs likely inaccurate.   Re-estimated needs:  Kcal: 1300-1450  Protein: 65-80 gm  Fluid: 1.3-1.5 L  Skin: Chest incision  Diet Order: Dysphagia 3, thin    Intake/Output Summary (Last 24 hours) at 05/27/13 1003 Last data filed at 05/26/13 1630  Gross per 24 hour  Intake    480 ml  Output      0 ml  Net    480 ml    Last BM: 8/12   Labs:   Recent Labs Lab 05/21/13 1100  NA 141  K 3.9  CL 102  CO2 32  BUN 28*  CREATININE 1.37*  CALCIUM 10.3  GLUCOSE 87   INR  Date/Time Value Range Status  05/27/2013  5:30 AM 1.87* 0.00 - 1.49 Final  05/26/2013  4:50 AM 1.78* 0.00 - 1.49 Final  05/25/2013  5:45 AM 1.68* 0.00 - 1.49 Final    CBG (last 3)  No results found for this basename: GLUCAP,  in the last 72 hours  Scheduled Meds: . antiseptic oral rinse  15 mL Mouth Rinse BID  . artificial tears  1 application Both Eyes QHS  . aspirin EC  81 mg Oral Daily  . citalopram  10 mg Oral Daily  . donepezil  10 mg Oral QHS  . feeding supplement  237 mL Oral BID BM  . furosemide  20 mg Oral BID  . levothyroxine  75 mcg Oral QAC breakfast  . losartan  50 mg Oral Daily  . multivitamin with minerals  1 tablet Oral Daily  . mupirocin ointment   Nasal BID  . pantoprazole  40 mg Oral Daily  . senna  2 tablet  Oral QHS  . sodium chloride  3 mL Intravenous Q12H  . tiotropium  18 mcg Inhalation QHS  . Warfarin - Pharmacist Dosing Inpatient   Does not apply q1800    Continuous Infusions: . sodium chloride 20 mL/hr at 05/25/13 0558  . heparin 450 Units/hr (05/26/13 1812)    Clarene Duke RD, LDN Pager [phone] After Hours pager [phone]

## 2013-05-27 NOTE — Progress Notes (Signed)
CSW continuing to follow for return to [facility], possibly tomorrow. CSW updated [employer].   Maree Krabbe, MSW, [facility] [phone]

## 2013-05-27 NOTE — Progress Notes (Signed)
[facility]  Subjective: No complaints.  Objective: Vital signs in last 24 hours: Temp:  [98 F (36.7 C)-98.1 F (36.7 C)] 98 F (36.7 C) (08/14 0526) Pulse Rate:  [72-101] 72 (08/14 0526) Resp:  [16-18] 17 (08/14 0526) BP: (113-140)/(68-88) 113/68 mmHg (08/14 0526) SpO2:  [100 %] 100 % (08/14 0526) Last BM Date: 05/25/13  Intake/Output from previous day: 08/13 0701 - 08/14 0700 In: 720 [P.O.:720] Out: -  Intake/Output this shift:    Medications Current Facility-Administered Medications  Medication Dose Route Frequency Provider Last Rate Last Dose  . 0.9 %  sodium chloride infusion   Intravenous Continuous Thurmon Fair, MD 20 mL/hr at 05/25/13 0558    . acetaminophen (TYLENOL) tablet 325-650 mg  325-650 mg Oral Q4H PRN Thurmon Fair, MD   650 mg at 05/26/13 2025  . ipratropium (ATROVENT) nebulizer solution 0.5 mg  0.5 mg Nebulization Q6H PRN Lauren Bajbus, RPH       And  . albuterol (PROVENTIL) (5 MG/ML) 0.5% nebulizer solution 2.5 mg  2.5 mg Nebulization Q6H PRN Lauren Bajbus, RPH      . antiseptic oral rinse (BIOTENE) solution 15 mL  15 mL Mouth Rinse BID Mihai Croitoru, MD   15 mL at 05/26/13 2025  . artificial tears (LACRILUBE) ophthalmic ointment 1 application  1 application Both Eyes QHS Wilburt Finlay, PA-C   1 application at 05/26/13 2145  . aspirin EC tablet 81 mg  81 mg Oral Daily Wilburt Finlay, PA-C   81 mg at 05/26/13 1133  . citalopram (CELEXA) tablet 10 mg  10 mg Oral Daily Wilburt Finlay, PA-C   10 mg at 05/26/13 1133  . donepezil (ARICEPT) tablet 10 mg  10 mg Oral QHS Wilburt Finlay, PA-C   10 mg at 05/26/13 2145  . feeding supplement (ENSURE COMPLETE) liquid 237 mL  237 mL Oral BID BM Hettie Holstein, RD   237 mL at 05/26/13 1419  . furosemide (LASIX) tablet 20 mg  20 mg Oral BID Wilburt Finlay, PA-C   20 mg at 05/26/13 1713  . heparin ADULT infusion 100 units/mL (25000 units/250 mL)  450 Units/hr Intravenous Continuous Severiano Gilbert, [facility] 4.5 mL/hr at 05/26/13 1812 450 Units/hr at 05/26/13 1812  . levothyroxine (SYNTHROID, LEVOTHROID) tablet 75 mcg  75 mcg Oral QAC breakfast Wilburt Finlay, PA-C   75 mcg at 05/26/13 0900  . losartan (COZAAR) tablet 50 mg  50 mg Oral Daily Wilburt Finlay, PA-C   50 mg at 05/26/13 1134  . multivitamin with minerals tablet 1 tablet  1 tablet Oral Daily Hettie Holstein, RD   1 tablet at 05/26/13 1133  . mupirocin ointment (BACTROBAN) 2 %   Nasal BID Mihai Croitoru, MD      . ondansetron (ZOFRAN) injection 4 mg  4 mg Intravenous Q6H PRN Mihai Croitoru, MD      . pantoprazole (PROTONIX) EC tablet 40 mg  40 mg Oral Daily Wilburt Finlay, PA-C   40 mg at 05/26/13 1133  . senna (SENOKOT) tablet 17.2 mg  2 tablet Oral QHS Wilburt Finlay, PA-C   17.2 mg at 05/26/13 2145  . sodium chloride 0.9 % injection 3 mL  3 mL Intravenous Q12H Wilburt Finlay, PA-C   3 mL at 05/26/13 2148  . tiotropium (SPIRIVA) inhalation capsule 18 mcg  18 mcg Inhalation QHS Wilburt Finlay, PA-C   18 mcg at 05/26/13 2133  . traMADol (ULTRAM) tablet 25 mg  25 mg Oral Q12H PRN  Wilburt Finlay, PA-C   25 mg at 05/25/13 2146  . Warfarin - Pharmacist Dosing Inpatient   Does not apply q1800 Dennie Fetters, [facility]        PE: General appearance: alert, cooperative and no distress Lungs: clear to auscultation bilaterally Heart: irregularly irregular rhythm and 1/6 SM Extremities: no LEE Pulses: 2+ and symmetric Skin: warm and dry Neurologic: Grossly normal  Lab Results:   Recent Labs  05/25/13 0545 05/26/13 0450 05/27/13 0530  WBC 4.9 5.1 5.9  HGB 10.9* 10.2* 11.6*  HCT 33.4* 30.7* 35.2*  PLT 179 169 193   BMET No results found for this basename: NA, K, CL, CO2, GLUCOSE, BUN, CREATININE, CALCIUM,  in the last 72 hours PT/INR  Recent Labs  05/25/13 0545 05/26/13 0450 05/27/13 0530  LABPROT 19.3* 20.2* 21.0*  INR 1.68* 1.78* 1.87*   Assessment/Plan  Principal Problem:   Pacemaker end of life Active Problems:    Coronary artery disease   Chronic anticoagulation   NSVT (nonsustained ventricular tachycardia)  Plan: POD #3: S/p dual chamber pacemaker generator change-out. INR slowly trending upward. Currently at 1.87. Will discharge back to SNF once INR >2.0. Pharmacy is assisting with Coumadin dosing. Continue on Heparin bridge.     LOS: 6 days    Narciso Stoutenburg M. Delmer Islam 05/27/2013 9:46 AM

## 2013-05-28 DIAGNOSIS — J449 Chronic obstructive pulmonary disease, unspecified: Secondary | ICD-10-CM

## 2013-05-28 DIAGNOSIS — I10 Essential (primary) hypertension: Secondary | ICD-10-CM

## 2013-05-28 DIAGNOSIS — Z7901 Long term (current) use of anticoagulants: Secondary | ICD-10-CM

## 2013-05-28 LAB — HEPARIN LEVEL (UNFRACTIONATED): Heparin Unfractionated: 0.42 IU/mL (ref 0.30–0.70)

## 2013-05-28 LAB — CBC
HCT: 35.2 % — ABNORMAL LOW (ref 36.0–46.0)
MCV: 92.6 fL (ref 78.0–100.0)
RBC: 3.8 MIL/uL — ABNORMAL LOW (ref 3.87–5.11)
RDW: 13.3 % (ref 11.5–15.5)
WBC: 5.5 10*3/uL (ref 4.0–10.5)

## 2013-05-28 NOTE — Progress Notes (Signed)
[facility]  Subjective: No complaints  Objective: Vital signs in last 24 hours: Temp:  [97.6 F (36.4 C)-98.3 F (36.8 C)] 97.6 F (36.4 C) (08/15 0430) Pulse Rate:  [72-78] 78 (08/15 0430) Resp:  [16-17] 16 (08/15 0430) BP: (110-113)/(80-90) 110/80 mmHg (08/15 0430) SpO2:  [98 %-100 %] 100 % (08/15 0430) Weight:  [126 lb 4.8 oz (57.289 kg)] 126 lb 4.8 oz (57.289 kg) (08/15 0430) Last BM Date: 05/27/13  Intake/Output from previous day: 08/14 0701 - 08/15 0700 In: 1703.1 [P.O.:200; I.V.:1503.1] Out: -  Intake/Output this shift:    Medications Current Facility-Administered Medications  Medication Dose Route Frequency Provider Last Rate Last Dose  . 0.9 %  sodium chloride infusion   Intravenous Continuous Tova Vater, MD 10 mL/hr at 05/27/13 0800    . acetaminophen (TYLENOL) tablet 325-650 mg  325-650 mg Oral Q4H PRN Nomi Rudnicki, MD   650 mg at 05/27/13 1025  . ipratropium (ATROVENT) nebulizer solution 0.5 mg  0.5 mg Nebulization Q6H PRN Lauren Bajbus, RPH       And  . albuterol (PROVENTIL) (5 MG/ML) 0.5% nebulizer solution 2.5 mg  2.5 mg Nebulization Q6H PRN Lauren Bajbus, RPH      . antiseptic oral rinse (BIOTENE) solution 15 mL  15 mL Mouth Rinse BID Shermar Friedland, MD   15 mL at 05/27/13 2309  . artificial tears (LACRILUBE) ophthalmic ointment 1 application  1 application Both Eyes QHS Wilburt Finlay, PA-C   1 application at 05/27/13 2309  . aspirin EC tablet 81 mg  81 mg Oral Daily Wilburt Finlay, PA-C   81 mg at 05/27/13 1025  . citalopram (CELEXA) tablet 10 mg  10 mg Oral Daily Wilburt Finlay, PA-C   10 mg at 05/27/13 1025  . donepezil (ARICEPT) tablet 10 mg  10 mg Oral QHS Wilburt Finlay, PA-C   10 mg at 05/27/13 2309  . feeding supplement (ENSURE COMPLETE) liquid 237 mL  237 mL Oral BID BM Hettie Holstein, RD   237 mL at 05/27/13 1500  . furosemide (LASIX) tablet 20 mg  20 mg Oral BID Wilburt Finlay, PA-C   20 mg at 05/27/13 1631  .  heparin ADULT infusion 100 units/mL (25000 units/250 mL)  500 Units/hr Intravenous Continuous Dennie Fetters, RPH 5 mL/hr at 05/27/13 1027 500 Units/hr at 05/27/13 1027  . levothyroxine (SYNTHROID, LEVOTHROID) tablet 75 mcg  75 mcg Oral QAC breakfast Wilburt Finlay, PA-C   75 mcg at 05/27/13 1025  . losartan (COZAAR) tablet 50 mg  50 mg Oral Daily Wilburt Finlay, PA-C   50 mg at 05/27/13 1025  . multivitamin with minerals tablet 1 tablet  1 tablet Oral Daily Hettie Holstein, RD   1 tablet at 05/27/13 1025  . mupirocin ointment (BACTROBAN) 2 %   Nasal BID Averill Winters, MD      . ondansetron (ZOFRAN) injection 4 mg  4 mg Intravenous Q6H PRN Jordis Repetto, MD      . pantoprazole (PROTONIX) EC tablet 40 mg  40 mg Oral Daily Wilburt Finlay, PA-C   40 mg at 05/27/13 1025  . senna (SENOKOT) tablet 17.2 mg  2 tablet Oral QHS Wilburt Finlay, PA-C   17.2 mg at 05/26/13 2145  . sodium chloride 0.9 % injection 3 mL  3 mL Intravenous Q12H Wilburt Finlay, PA-C   3 mL at 05/27/13 1039  . tiotropium (SPIRIVA) inhalation capsule 18 mcg  18 mcg Inhalation QHS Wilburt Finlay, PA-C  18 mcg at 05/27/13 1928  . traMADol (ULTRAM) tablet 25 mg  25 mg Oral Q12H PRN Wilburt Finlay, PA-C   25 mg at 05/25/13 2146  . Warfarin - Pharmacist Dosing Inpatient   Does not apply q1800 Dennie Fetters, [facility]        PE: General appearance: alert, cooperative and no distress Lungs: clear to auscultation bilaterally Heart: irregularly irregular rhythm and 1/6 murmur Extremities: no LEE Pulses: 2+ radials, 1+ DPs Skin: warm and dry Neurologic: Grossly normal  Lab Results:   Recent Labs  05/26/13 0450 05/27/13 0530 05/28/13 0425  WBC 5.1 5.9 5.5  HGB 10.2* 11.6* 11.5*  HCT 30.7* 35.2* 35.2*  PLT 169 193 173   BMET No results found for this basename: NA, K, CL, CO2, GLUCOSE, BUN, CREATININE, CALCIUM,  in the last 72 hours PT/INR  Recent Labs  05/26/13 0450 05/27/13 0530 05/28/13 0425  LABPROT 20.2* 21.0* 23.3*   INR 1.78* 1.87* 2.15*    Assessment/Plan  Principal Problem:   Pacemaker end of life Active Problems:   Coronary artery disease   Chronic anticoagulation   NSVT (nonsustained ventricular tachycardia)  Plan:  POD #4: S/p dual chamber pacemaker generator change-out. INR is now therapeutic at 2.15. Will discontinue IV heparin. Bandage overlying incision site is free of drainage. HR and BP both stable. Plan for discharge back to SNF today.     LOS: 7 days    Brittainy M. Delmer Islam 05/28/2013 8:18 AM  I have seen and examined the patient along with Brittainy M. Sharol Harness, PA-C.  I have reviewed the chart, notes and new data.  I agree with PA's note.  Staples need to be removed in about 5 days. DC to nursing facility today. Follow up in one month in the office.  Thurmon Fair, MD, [facility] [phone] 05/28/2013, 8:44 AM

## 2013-05-28 NOTE — Discharge Summary (Signed)
Physician Discharge Summary  Patient ID: [name] MRN: [id number] DOB/AGE: Apr 23, 1923 79 y.o.  Admit date: 05/21/2013 Discharge date: 05/28/2013  Admission Diagnoses: Pacemaker end of life  Discharge Diagnoses:  Principal Problem:   Pacemaker end of life Active Problems:   Coronary artery disease   Chronic anticoagulation   NSVT (nonsustained ventricular tachycardia)   Discharged Condition: stable  Hospital Course: Savannah patient is a 79 y/o AAF, and resident of [name], with a history of [name] in 2001 and PTVP for symptomatic bradycardia in 2001, with AV node ablation for left atrial flutter and difficult-to-control ventricular response. She was seen by [name]. Alanda [name], at [name], on 05/20/13 for hypotension and fatigue, and it was discovered that her pacemaker had reached end-of-life. Savannah patient apparently was lost to follow-up and had not been seen in our office in over four years. [name]. Alanda [name] was unable to interrogate Savannah pacemaker and it was unsure when she reached ERI. It was felt that her hypotension and fatigue may have been related to this. On 05/21/13, she presented to [name] to undergo a generator change-out. Savannah procedure was performed by [name]. Royann [name]. She underwent successful change-out. Savannah newly implanted generator was a [name] model number [id number], serial number [id number].  Savannah new generator was connected to Savannah chronic leads, with appropriate pacing noted. She tolerated Savannah procedure well and there were no complications. Her hospital stay, however, was prolonged due to a sub therapeutic INR. Pharmacy was consulted to help with Coumadin dosing and she was bridged with IV heparin. On hospital day 7, her INR reached therapeutic range. She was last seen by [name]. Royann [name], who determined that she was stable for discharge back to SNF. He has ordered for [facility], 5 days post discharge. She will follow up in  our office with [name]. Alanda [name] on 07/02/13.   Consults: None  Significant Diagnostic Studies: None  Treatments: See Hospital Course  Discharge Exam: Blood pressure 110/80, pulse 78, temperature 97.6 F (36.4 C), temperature source Oral, resp. rate 16, height 5\' 2"  (1.575 m), weight 126 lb 4.8 oz (57.289 kg), SpO2 100.00%.   Disposition: SNF      Discharge Orders   Future Orders Complete By Expires   Diet - low sodium heart healthy  As directed    Discharge instructions  As directed    Comments:     Remove staples ~ 5 days (06/02/13)   Increase activity slowly  As directed        Medication List         acetaminophen 325 MG tablet  Commonly known as:  TYLENOL  Take 650 mg by mouth 2 (two) times daily.     artificial tears Oint ophthalmic ointment  Place 1 application into both eyes at bedtime.     aspirin 81 MG EC tablet  Take 1 tablet (81 mg total) by mouth daily.     citalopram 10 MG tablet  Commonly known as:  CELEXA  Take 10 mg by mouth daily.     donepezil 10 MG tablet  Commonly known as:  ARICEPT  Take 10 mg by mouth at bedtime.     furosemide 20 MG tablet  Commonly known as:  LASIX  Take 20 mg by mouth 2 (two) times daily.     ipratropium-albuterol 0.5-2.5 (3) MG/3ML Soln  Commonly known as:  DUONEB  Take 3 mLs by nebulization every 6 (six)  hours as needed. For shortness of breath     levothyroxine 75 MCG tablet  Commonly known as:  SYNTHROID, LEVOTHROID  Take 75 mcg by mouth daily.     losartan 50 MG tablet  Commonly known as:  COZAAR  Take 50 mg by mouth daily.     omeprazole 20 MG capsule  Commonly known as:  PRILOSEC  Take 20 mg by mouth daily.     oyster calcium 500 MG Tabs tablet  Take 500 mg of elemental calcium by mouth 3 (three) times daily.     senna 8.6 MG Tabs tablet  Commonly known as:  SENOKOT  Take 2 tablets by mouth at bedtime.     tiotropium 18 MCG inhalation capsule  Commonly known as:  SPIRIVA  Place 18 mcg into  inhaler and inhale daily.     traMADol 50 MG tablet  Commonly known as:  ULTRAM  Take 25 mg by mouth 2 (two) times daily as needed. For pain     warfarin 4 MG tablet  Commonly known as:  COUMADIN  Take 3.5 mg by mouth daily.       Follow-up Information   Follow up with Savannah Rooks, MD On 07/02/2013. (11:15 am)    Specialty:  Cardiology   Contact information:   [address] [place] [address] [phone]     TIME SPENT ON DISCHARGE, INCLUDING PHYSICIAN TIME: >30 MINUTES  Signed: Allayne Butcher, PA-C 05/28/2013, 10:01 AM

## 2013-05-28 NOTE — Progress Notes (Signed)
Assessment unchanged.  Discharged instructions given to daughter, verbalized understanding.  Packet given to EMS.  IV and tele removed.  Report called to facility.  Patient left via EMS.

## 2013-05-28 NOTE — Progress Notes (Signed)
Clinical Social Worker facilitated patient discharge by contacting the patient, family and facility, [facility]. Patient's daughter agreeable to this plan and arranging transport via EMS.  CSW will sign off, as social work intervention is no longer needed.  Maree Krabbe, MSW, Theresia Majors [phone]

## 2013-06-04 ENCOUNTER — Non-Acute Institutional Stay (SKILLED_NURSING_FACILITY): Payer: PRIVATE HEALTH INSURANCE | Admitting: Internal Medicine

## 2013-06-04 ENCOUNTER — Encounter: Payer: Self-pay | Admitting: Internal Medicine

## 2013-06-04 DIAGNOSIS — Z45018 Encounter for adjustment and management of other part of cardiac pacemaker: Secondary | ICD-10-CM

## 2013-06-04 DIAGNOSIS — Z7901 Long term (current) use of anticoagulants: Secondary | ICD-10-CM

## 2013-06-04 DIAGNOSIS — I639 Cerebral infarction, unspecified: Secondary | ICD-10-CM

## 2013-06-04 DIAGNOSIS — K219 Gastro-esophageal reflux disease without esophagitis: Secondary | ICD-10-CM

## 2013-06-04 DIAGNOSIS — M1990 Unspecified osteoarthritis, unspecified site: Secondary | ICD-10-CM

## 2013-06-04 DIAGNOSIS — M129 Arthropathy, unspecified: Secondary | ICD-10-CM

## 2013-06-04 DIAGNOSIS — I10 Essential (primary) hypertension: Secondary | ICD-10-CM

## 2013-06-04 DIAGNOSIS — F0150 Vascular dementia without behavioral disturbance: Secondary | ICD-10-CM

## 2013-06-04 DIAGNOSIS — I4891 Unspecified atrial fibrillation: Secondary | ICD-10-CM

## 2013-06-04 DIAGNOSIS — F329 Major depressive disorder, single episode, unspecified: Secondary | ICD-10-CM

## 2013-06-04 DIAGNOSIS — I6350 Cerebral infarction due to unspecified occlusion or stenosis of unspecified cerebral artery: Secondary | ICD-10-CM

## 2013-06-04 DIAGNOSIS — J449 Chronic obstructive pulmonary disease, unspecified: Secondary | ICD-10-CM

## 2013-06-04 NOTE — Progress Notes (Signed)
Patient ID: [name], female   DOB: 09/30/1923, 79 y.o.   MRN: [id number]  [facility]   Code Status: full code  Allergies  Allergen Reactions  . Advil [Ibuprofen] Other (See Comments)    "think it just doesn't work for her"  . Alprazolam Other (See Comments)    "don't remember what happens"  . Relafen [Nabumetone] Other (See Comments)    "don't remember what happens"  . Sulfa Antibiotics Other (See Comments)    "don't remember what happens"  . Toprol Xl [Metoprolol Succinate] Other (See Comments)    25mg ; "don't remember what happens"    Chief Complaint: re-admit post hospitalization  HPI:  80 y/o female patient is a long term care resident of the facility. She has been admitted under [facility] recently. She was in the hospital from 8/814- 05/28/13 for elective pacemaker generator change. She has hx of MVR in 2001 and had a pacemaker placed. She saw her cardiologist recently for hypotension and fatigue and was found that her pacemaker had reached end of life. They were unable to interrogate the pacemaker and admitted her to undergo a generator change out. She had [employer] Jude generator implanted. She was put on coumadin and bridged with heparin and once her inr was therapeutic, she was sent back to SNF She was seen in her room today. She is in no distress. Her staples from incision site for pacemaker was removed 2 days back. She is on magic cup supplement with meals. She is taking mechanical soft diet. She has been doing well and denies any complaints this visit  Review of Systems  Constitutional: Negative for fever, chills and diaphoresis.  HENT: Negative for congestion.   Eyes: Negative for blurred vision.  Respiratory: Negative for cough, shortness of breath and wheezing.   Cardiovascular: Negative for chest pain and palpitations.  Gastrointestinal: Negative for heartburn, nausea and vomiting.  Genitourinary:       Urinary incontinence  Musculoskeletal: Negative for  falls.  Skin: Negative for rash.  Neurological: Negative for dizziness, seizures, loss of consciousness, weakness and headaches.     Past Medical History  Diagnosis Date  . Hypertension   . Hyperlipidemia   . Hypothyroidism   . Atrial fibrillation   . Pacemaker   . Shortness of breath   . CHF (congestive heart failure) 05/27/2006; 05/30/2006; 11/06/2008  . Vascular dementia     "due to CVA 06/08/2009"  . Stroke 03/06/2001; 05/30/2009    "light stroke"  . Stroke 06/08/2009    "probable small TIA"  . Stroke 01/06/2002  . Angina 12/22/2003  . Ventilator dependent 06/08/2009    respiratory failure  . H/O: rheumatic fever     "childhood"  . Coronary artery disease   . Left atrial enlargement   . H/O cardiomegaly     diastolic  . Pulmonary hypertension   . Anemia     "iron transfusions"  . Blood transfusion   . History of lower GI bleeding     "multiple; required blood transfusions"  . COPD (chronic obstructive pulmonary disease)   . GERD (gastroesophageal reflux disease)   . H/O hiatal hernia   . H/O chronic bronchitis   . Asthma   . Arthritis   . Anxiety   . Depression   . Dysphagia as late effect of stroke   . Autoimmune hepatitis 07/12/1999  . Bilateral renal cysts     "2 large on right off the lower pole"  . Chronic renal insufficiency   .  Pleural effusion     "moderate right & small left"  . Bacterial pneumonia 11/06/2008; 10/23/2009; 03/30/2010  . Avascular necrosis of femoral head     right   Past Surgical History  Procedure Laterality Date  . Insert / replace / remove pacemaker  09/11/2000  . Av node ablation  "06/06/2005"  . Percutaneous tracheostomy  06/15/2009  . Electroencephalogram  01/28/2002; 11/26/2005; 10/23/2009  . Cardiac catheterization  06/14/1999; 12/26/2003; 05/30/2009  . Transcranial doppler & carotid ultrasound  11/28/2005  . Cardiac valve replacement  08/20/1999    "mitral valve replacement; St. Jude"  . Cardiac valve replacement  06/05/2009     "redo  sternotomy & aortic valve replacement; tissue valve"  . Cauterized cecal av malformation    . Abdominal hysterectomy  1960's  . Dental implants removed  03/04/2001    lower  . Knee arthroscopy  12/26/2000    left  . Knee arthroscopy  07/29/2001    right   Social History:   reports that she quit smoking about 42 years ago. Her smoking use included Cigarettes. She has a 30 pack-year smoking history. She has never used smokeless tobacco. She reports that she does not drink alcohol or use illicit drugs.  History reviewed. No pertinent family history.  Medications: Patient's Medications  New Prescriptions   No medications on file  Previous Medications   ACETAMINOPHEN (TYLENOL) 325 MG TABLET    Take 650 mg by mouth 2 (two) times daily.   ARTIFICIAL TEARS (LACRILUBE) OINT OPHTHALMIC OINTMENT    Place 1 application into both eyes at bedtime.   ASPIRIN EC 81 MG EC TABLET    Take 1 tablet (81 mg total) by mouth daily.   CITALOPRAM (CELEXA) 10 MG TABLET    Take 10 mg by mouth daily.   DONEPEZIL (ARICEPT) 10 MG TABLET    Take 10 mg by mouth at bedtime.   FUROSEMIDE (LASIX) 20 MG TABLET    Take 20 mg by mouth 2 (two) times daily.   IPRATROPIUM-ALBUTEROL (DUONEB) 0.5-2.5 (3) MG/3ML SOLN    Take 3 mLs by nebulization every 6 (six) hours as needed. For shortness of breath   LEVOTHYROXINE (SYNTHROID, LEVOTHROID) 75 MCG TABLET    Take 75 mcg by mouth daily.   LOSARTAN (COZAAR) 50 MG TABLET    Take 50 mg by mouth daily.   OMEPRAZOLE (PRILOSEC) 20 MG CAPSULE    Take 20 mg by mouth daily.   OYSTER SHELL (OYSTER CALCIUM) 500 MG TABS    Take 500 mg of elemental calcium by mouth 3 (three) times daily.   SENNA (SENOKOT) 8.6 MG TABS    Take 2 tablets by mouth at bedtime.   TIOTROPIUM (SPIRIVA) 18 MCG INHALATION CAPSULE    Place 18 mcg into inhaler and inhale daily.   TRAMADOL (ULTRAM) 50 MG TABLET    Take 25 mg by mouth 2 (two) times daily as needed. For pain   WARFARIN (COUMADIN) 4 MG TABLET    Take 3.5 mg by  mouth daily.  Modified Medications   No medications on file  Discontinued Medications   No medications on file     Physical Exam: Filed Vitals:   06/04/13 1144  BP: 115/73  Pulse: 74  Temp: 95.9 F (35.5 C)  Resp: 17  SpO2: 99%   Constitutional:  Thin, in NAD Neck: Neck supple. No tracheal deviation present. No thyromegaly present.  Cardiovascular: Normal rate, regular rhythm and intact distal pulses.  pacemaker in right upper chest.  Scar from previous sternotomy Respiratory: Effort normal and breath sounds normal. No respiratory distress. She has no wheezes.  GI: Soft. Bowel sounds are normal. She exhibits no distension. There is no tenderness.  Musculoskeletal: She exhibits no edema.  Neurological: She is alert.  Skin: Skin is warm and dry. Incision site healing well, staples have been removed   Labs reviewed:  01-14-13: wbc 4.9; hgb 10.4; hvct 31.4 ;mcv 90.2 ;plt 190; glucose 65; bun 22; creat 1.12; k+4.1; na++143 Liver normal 3.7; tsh 0.656   Basic Metabolic Panel:  Recent Labs  29/56/21 1853 06/09/12 0558 05/21/13 1100  NA 140 143 141  K 3.5 3.4* 3.9  CL 104 104 102  CO2 22 27 32  GLUCOSE 101* 81 87  BUN 12 12 28*  CREATININE 0.93 0.99 1.37*  CALCIUM 10.2 10.6* 10.3   Liver Function Tests:  Recent Labs  06/08/12 1853 06/09/12 0558  AST 28 29  ALT 11 10  ALKPHOS 84 87  BILITOT 0.7 1.0  PROT 8.5* 8.8*  ALBUMIN 3.5 3.8   CBC:  Recent Labs  06/08/12 1853  05/26/13 0450 05/27/13 0530 05/28/13 0425  WBC 5.6  < > 5.1 5.9 5.5  NEUTROABS 4.6  --   --   --   --   HGB 10.9*  < > 10.2* 11.6* 11.5*  HCT 33.3*  < > 30.7* 35.2* 35.2*  MCV 93.5  < > 91.9 92.6 92.6  PLT 154  < > 169 193 173  < > = values in this interval not displayed.  Assessment/Plan  Cardiac pacemaker in situ- had new pacer placed. Tolerated it well. Will continue her anticoagulation  cva with hemiplegia- bp stable. Continue bp medication, aspirin and coumadin. Not on statin  which will have mortality benefit. Check flp and will start her on lipitor 10 mg daily for now  afib- has a pacemaker and rate is currently controlled. Continue coumadin  Essential HTN- bp is well controlled. Continue cozaar and lasix for now, monitor bmp  COPD- Her status is stable and  will continue spiriva daily with duonebs  CHF- breathing is stable, no signs of volume overload. Continue cozaar and lasix and adjust medication if needed  Depression- mood remains stable, continue celexa for now  Dementia- vascular, continue aricept for now, stable, no behavioral changes  GERD- continue prilosec 20 mg daily will monitor   Chronic anticoagulation Goal inr 2-3, continue coumadin and monitor inr   Unspecified hypothyroidism Will continue synthroid 75 mcg daily   Arthritis Is stable will continue tylenol 650 mg twice daily and ultram 50 mg twice daily as needed    Family/ staff Communication: reviewed care plan with nursing staff and physician assistant   Labs/tests ordered- flp, cbc, cmp

## 2013-06-06 NOTE — Progress Notes (Signed)
This encounter was created in error - please disregard.

## 2013-06-07 ENCOUNTER — Ambulatory Visit: Payer: PRIVATE HEALTH INSURANCE | Admitting: Cardiology

## 2013-06-21 ENCOUNTER — Telehealth: Payer: Self-pay | Admitting: Cardiovascular Disease

## 2013-06-21 NOTE — Telephone Encounter (Signed)
Please call-wants to know about accessing her mother my chart.She have permission,just need the code.i

## 2013-06-22 NOTE — Telephone Encounter (Signed)
Returned call to [name], pt's daughter.  Requesting code for MyChart.  Stated she has completed the information to be pt's proxy for MyChart since pt has dementia and the code she was given has been deactivated.  Informed there is an active code and daughter stated she was never given that code.  Informed RN can generate a new code, but will have to mail code to pt.  Daughter stated [address] Box on file has been closed and pt uses her address as she is in a nursing home now.  Stated she completed new pt forms on 8.7.14 when pt was seen.  Informed RN has requested paper chart to confirm address change and will send letter once confirmed.  Verbalized understanding and asked that RN call her back once confirmed.

## 2013-06-22 NOTE — Telephone Encounter (Signed)
Returned call and informed chart received and information confirmed.  Verbalized understanding.

## 2013-07-01 LAB — PACEMAKER DEVICE OBSERVATION

## 2013-07-02 ENCOUNTER — Encounter: Payer: Self-pay | Admitting: Internal Medicine

## 2013-07-02 ENCOUNTER — Other Ambulatory Visit: Payer: Self-pay

## 2013-07-02 ENCOUNTER — Non-Acute Institutional Stay (SKILLED_NURSING_FACILITY): Payer: PRIVATE HEALTH INSURANCE | Admitting: Internal Medicine

## 2013-07-02 DIAGNOSIS — F329 Major depressive disorder, single episode, unspecified: Secondary | ICD-10-CM

## 2013-07-02 DIAGNOSIS — I509 Heart failure, unspecified: Secondary | ICD-10-CM | POA: Insufficient documentation

## 2013-07-02 DIAGNOSIS — I639 Cerebral infarction, unspecified: Secondary | ICD-10-CM

## 2013-07-02 DIAGNOSIS — I119 Hypertensive heart disease without heart failure: Secondary | ICD-10-CM

## 2013-07-02 DIAGNOSIS — I6350 Cerebral infarction due to unspecified occlusion or stenosis of unspecified cerebral artery: Secondary | ICD-10-CM

## 2013-07-02 DIAGNOSIS — I4891 Unspecified atrial fibrillation: Secondary | ICD-10-CM

## 2013-07-02 DIAGNOSIS — J449 Chronic obstructive pulmonary disease, unspecified: Secondary | ICD-10-CM

## 2013-07-02 DIAGNOSIS — F32A Depression, unspecified: Secondary | ICD-10-CM

## 2013-07-02 LAB — PACEMAKER DEVICE OBSERVATION
BRDY-0002RV: 70 {beats}/min
BRDY-0004RV: 120 {beats}/min
RV LEAD AMPLITUDE: 12 mv
RV LEAD IMPEDENCE PM: 640 Ohm

## 2013-07-02 NOTE — Progress Notes (Signed)
Patient ID: [name], female   DOB: 1923-06-21, 79 y.o.   MRN: [id number]  [facility]   Code Status: full code  Chief Complaint  Patient presents with  . Medical Managment of Chronic Issues   Allergies  Allergen Reactions  . Advil [Ibuprofen] Other (See Comments)    "think it just doesn't work for her"  . Alprazolam Other (See Comments)    "don't remember what happens"  . Relafen [Nabumetone] Other (See Comments)    "don't remember what happens"  . Sulfa Antibiotics Other (See Comments)    "don't remember what happens"  . Toprol Xl [Metoprolol Succinate] Other (See Comments)    25mg ; "don't remember what happens"    HPI:   79 y/o female patient is a long term care resident of the facility. She was seen in her room today with her daughter present. She just returned from her follow up appointment with cardiology. It has been recommended to discontinue her losartan- no full note available for review at this time. Her bp on review in facility have been in normal range. She has hx of MVR in 2001 and had a pacemaker placed which was replaced recently. She is on coumadin and baby aspirin. She is in no distress. She denies any complaints  Review of Systems  Constitutional: Negative for fever, chills and diaphoresis.  HENT: Negative for congestion.   Eyes: Negative for blurred vision.  Respiratory: Negative for cough, shortness of breath and wheezing.   Cardiovascular: Negative for chest pain and palpitations.  Gastrointestinal: Negative for heartburn, nausea and vomiting.  Genitourinary:        Urinary incontinence  Musculoskeletal: Negative for falls. Left sided weakness. On a wheelchair Skin: Negative for rash.  Neurological: Negative for dizziness, seizures, loss of consciousness, weakness and headaches.   Past Medical History  Diagnosis Date  . Hypertension   . Hyperlipidemia   . Hypothyroidism   . Atrial fibrillation   . Pacemaker   . Shortness of breath    . CHF (congestive heart failure) 05/27/2006; 05/30/2006; 11/06/2008  . Vascular dementia     "due to CVA 06/08/2009"  . Stroke 03/06/2001; 05/30/2009    "light stroke"  . Stroke 06/08/2009    "probable small TIA"  . Stroke 01/06/2002  . Angina 12/22/2003  . Ventilator dependent 06/08/2009    respiratory failure  . H/O: rheumatic fever     "childhood"  . Coronary artery disease   . Left atrial enlargement   . H/O cardiomegaly     diastolic  . Pulmonary hypertension   . Anemia     "iron transfusions"  . Blood transfusion   . History of lower GI bleeding     "multiple; required blood transfusions"  . COPD (chronic obstructive pulmonary disease)   . GERD (gastroesophageal reflux disease)   . H/O hiatal hernia   . H/O chronic bronchitis   . Asthma   . Arthritis   . Anxiety   . Depression   . Dysphagia as late effect of stroke   . Autoimmune hepatitis 07/12/1999  . Bilateral renal cysts     "2 large on right off the lower pole"  . Chronic renal insufficiency   . Pleural effusion     "moderate right & small left"  . Bacterial pneumonia 11/06/2008; 10/23/2009; 03/30/2010  . Avascular necrosis of femoral head     right   Past Surgical History  Procedure Laterality Date  . Insert / replace / remove pacemaker  09/11/2000  . Av node ablation  "06/06/2005"  . Percutaneous tracheostomy  06/15/2009  . Electroencephalogram  01/28/2002; 11/26/2005; 10/23/2009  . Cardiac catheterization  06/14/1999; 12/26/2003; 05/30/2009  . Transcranial doppler & carotid ultrasound  11/28/2005  . Cardiac valve replacement  08/20/1999    "mitral valve replacement; St. Jude"  . Cardiac valve replacement  06/05/2009     "redo sternotomy & aortic valve replacement; tissue valve"  . Cauterized cecal av malformation    . Abdominal hysterectomy  1960's  . Dental implants removed  03/04/2001    lower  . Knee arthroscopy  12/26/2000    left  . Knee arthroscopy  07/29/2001    right   Current Outpatient Prescriptions on  File Prior to Visit  Medication Sig Dispense Refill  . acetaminophen (TYLENOL) 325 MG tablet Take 650 mg by mouth 2 (two) times daily.      Marland Kitchen artificial tears (LACRILUBE) OINT ophthalmic ointment Place 1 application into both eyes at bedtime.      . citalopram (CELEXA) 10 MG tablet Take 10 mg by mouth daily.      Marland Kitchen donepezil (ARICEPT) 10 MG tablet Take 10 mg by mouth at bedtime.      . furosemide (LASIX) 20 MG tablet Take 20 mg by mouth 2 (two) times daily.      Marland Kitchen ipratropium-albuterol (DUONEB) 0.5-2.5 (3) MG/3ML SOLN Take 3 mLs by nebulization every 6 (six) hours as needed. For shortness of breath      . levothyroxine (SYNTHROID, LEVOTHROID) 75 MCG tablet Take 75 mcg by mouth daily.      Marland Kitchen losartan (COZAAR) 50 MG tablet Take 50 mg by mouth daily.      Marland Kitchen omeprazole (PRILOSEC) 20 MG capsule Take 20 mg by mouth daily.      Ethelda Chick (OYSTER CALCIUM) 500 MG TABS Take 500 mg of elemental calcium by mouth 3 (three) times daily.      Marland Kitchen senna (SENOKOT) 8.6 MG TABS Take 2 tablets by mouth at bedtime.      Marland Kitchen tiotropium (SPIRIVA) 18 MCG inhalation capsule Place 18 mcg into inhaler and inhale daily.      . traMADol (ULTRAM) 50 MG tablet Take 25 mg by mouth 2 (two) times daily as needed. For pain      . warfarin (COUMADIN) 4 MG tablet Take 3.5 mg by mouth daily.       No current facility-administered medications on file prior to visit.   Marland Kitchenphysical exam  BP 120/76  Pulse 60  Temp(Src) 97.5 F (36.4 C)  Resp 18  SpO2 95%  Constitutional:  Thin, in NAD Neck: Neck supple. No tracheal deviation present. No thyromegaly present.   Cardiovascular: Normal rate, regular rhythm and intact distal pulses.  pacemaker in right upper chest. Scar from previous sternotomy Respiratory: Effort normal and breath sounds normal. No respiratory distress. She has no wheezes.   GI: Soft. Bowel sounds are normal. She exhibits no distension. There is no tenderness.  Musculoskeletal: She exhibits no edema. Weakness in  all 4 extremities but left > right post CVA. Mild left hand contracture Neurological: She is alert and oriented to person  Skin: Skin is warm and dry.   Labs reviewed: 01-14-13: wbc 4.9; hgb 10.4; hvct 31.4 ;mcv 90.2 ;plt 190; glucose 65; bun 22; creat 1.12; k+4.1; na++143 Liver normal 3.7; tsh 0.656  06-15-13 wbc 5.2, hb 11, plt 211, na 139, k 4.1, bun 23, cr 1.3, glu 82, ca 10.2  Assessment/Plan  cva  with hemiplegia- bp stable. Continue bp medication, aspirin, lipitor and coumadin  Essential HTN- bp is well controlled. On losartan and lasix for now, monitor bmp. As per cardiology visit summary d/c losartan recommended. Unclear about full assessment and plan. Will await full note for review. At present with bp on lower side of normal, will decrease losartan to 25 mg daily. Monitor bp  CHF- breathing is stable, no signs of volume overload. Continue cozaar with new dosing and lasix and adjust medication if needed  COPD- Her status is stable and  will continue spiriva daily with duonebs  afib- has a pacemaker and rate is currently controlled. Continue coumadin and monitor inr, goal inr 2-3  Unspecified hypothyroidism- Will continue synthroid 75 mcg daily   Depression- mood remains stable, continue celexa for now  Dementia- vascular, continue aricept for now, stable, no behavioral changes  GERD- continue prilosec 20 mg daily will monitor   Chronic anticoagulation- Goal inr 2-3, continue coumadin and monitor inr   Cardiac pacemaker in situ- had new pacer placed. Tolerated it well. Will continue her anticoagulation  Arthritis- stable will continue tylenol 650 mg twice daily and ultram 50 mg twice daily as needed

## 2013-07-13 ENCOUNTER — Encounter: Payer: Self-pay | Admitting: Cardiovascular Disease

## 2013-07-20 ENCOUNTER — Encounter: Payer: Self-pay | Admitting: Adult Health

## 2013-07-20 NOTE — Progress Notes (Signed)
Patient ID: [name], female   DOB: May 23, 1923, 79 y.o.   MRN: [id number]  [facility]   Allergies  Allergen Reactions  . Advil [Ibuprofen] Other (See Comments)    "think it just doesn't work for her"  . Alprazolam Other (See Comments)    "don't remember what happens"  . Relafen [Nabumetone] Other (See Comments)    "don't remember what happens"  . Sulfa Antibiotics Other (See Comments)    "don't remember what happens"  . Toprol Xl [Metoprolol Succinate] Other (See Comments)    25mg ; "don't remember what happens"     Chief Complaint  Patient presents with  . Acute Visit    coumadin management     HPI  She is being seen for the management of her coumadin therapy. Her inr today is 4.2 and has been taking coumadin 3.5 mg daily  she has afib with a heart valve replacement. She is tolerating coumadin without difficulty.   Past Medical History  Diagnosis Date  . Hypertension   . Hyperlipidemia   . Hypothyroidism   . Atrial fibrillation   . Pacemaker   . Shortness of breath   . CHF (congestive heart failure) 05/27/2006; 05/30/2006; 11/06/2008  . Vascular dementia     "due to CVA 06/08/2009"  . Stroke 03/06/2001; 05/30/2009    "light stroke"  . Stroke 06/08/2009    "probable small TIA"  . Stroke 01/06/2002  . Angina 12/22/2003  . Ventilator dependent 06/08/2009    respiratory failure  . H/O: rheumatic fever     "childhood"  . Coronary artery disease   . Left atrial enlargement   . H/O cardiomegaly     diastolic  . Pulmonary hypertension   . Anemia     "iron transfusions"  . Blood transfusion   . History of lower GI bleeding     "multiple; required blood transfusions"  . COPD (chronic obstructive pulmonary disease)   . GERD (gastroesophageal reflux disease)   . H/O hiatal hernia   . H/O chronic bronchitis   . Asthma   . Arthritis   . Anxiety   . Depression   . Dysphagia as late effect of stroke   . Autoimmune hepatitis 07/12/1999  . Bilateral renal cysts     "2  large on right off the lower pole"  . Chronic renal insufficiency   . Pleural effusion     "moderate right & small left"  . Bacterial pneumonia 11/06/2008; 10/23/2009; 03/30/2010  . Avascular necrosis of femoral head     right  . A-fib 01/04/2013    Past Surgical History  Procedure Laterality Date  . Insert / replace / remove pacemaker  09/11/2000  . Av node ablation  "06/06/2005"  . Percutaneous tracheostomy  06/15/2009  . Electroencephalogram  01/28/2002; 11/26/2005; 10/23/2009  . Cardiac catheterization  06/14/1999; 12/26/2003; 05/30/2009  . Transcranial doppler & carotid ultrasound  11/28/2005  . Cardiac valve replacement  08/20/1999    "mitral valve replacement; [employer] Jude"  . Cardiac valve replacement  06/05/2009     "redo sternotomy & aortic valve replacement; tissue valve"  . Cauterized cecal av malformation    . Abdominal hysterectomy  1960's  . Dental implants removed  03/04/2001    lower  . Knee arthroscopy  12/26/2000    left  . Knee arthroscopy  07/29/2001    right    Current Outpatient Prescriptions on File Prior to Visit  Medication Sig Dispense Refill  . acetaminophen (TYLENOL) 325 MG tablet Take  650 mg by mouth 2 (two) times daily.      Marland Kitchen artificial tears (LACRILUBE) OINT ophthalmic ointment Place 1 application into both eyes at bedtime.      . citalopram (CELEXA) 10 MG tablet Take 10 mg by mouth daily.      Marland Kitchen donepezil (ARICEPT) 10 MG tablet Take 10 mg by mouth at bedtime.      . furosemide (LASIX) 20 MG tablet Take 20 mg by mouth 2 (two) times daily.      Marland Kitchen ipratropium-albuterol (DUONEB) 0.5-2.5 (3) MG/3ML SOLN Take 3 mLs by nebulization every 6 (six) hours as needed. For shortness of breath      . levothyroxine (SYNTHROID, LEVOTHROID) 75 MCG tablet Take 75 mcg by mouth daily.      Marland Kitchen losartan (COZAAR) 50 MG tablet Take 50 mg by mouth daily.      Marland Kitchen omeprazole (PRILOSEC) 20 MG capsule Take 20 mg by mouth daily.      Ethelda Chick (OYSTER CALCIUM) 500 MG TABS Take 500 mg of  elemental calcium by mouth 3 (three) times daily.      Marland Kitchen senna (SENOKOT) 8.6 MG TABS Take 2 tablets by mouth at bedtime.      Marland Kitchen tiotropium (SPIRIVA) 18 MCG inhalation capsule Place 18 mcg into inhaler and inhale daily.      . traMADol (ULTRAM) 50 MG tablet Take 25 mg by mouth 2 (two) times daily as needed. For pain      . warfarin (COUMADIN) 4 MG tablet Take 3.5 mg by mouth daily.       No current facility-administered medications on file prior to visit.   Filed Vitals:   05/19/13 1344  BP: 117/65  Pulse: 72  Height: 5' (1.524 m)  Weight: 126 lb (57.153 kg)     LABS REVIEWED:   01-14-13: wbc 4.9; hgb 10.4; hvct 31.4 ;mcv 90.2 ;plt 190; glucose 65; bun 22; creat 1.12; k+4.1; na++143 Liver normal 3.7; tsh 0.656  Review of Systems  Unable to perform ROS   Physical Exam  Constitutional:  thin  Neck: Neck supple. No tracheal deviation present. No thyromegaly present.  Cardiovascular: Normal rate, regular rhythm and intact distal pulses.   Respiratory: Effort normal and breath sounds normal. No respiratory distress. She has no wheezes.  GI: Soft. Bowel sounds are normal. She exhibits no distension. There is no tenderness.  Musculoskeletal: She exhibits no edema.  Neurological: She is alert.  Skin: Skin is warm and dry.   ASSESSMENT PLAN  Will begin coumadin 3 mg daily and will check inr in one week and will monitor her status

## 2013-07-20 NOTE — Progress Notes (Signed)
Patient ID: [name], female   DOB: January 11, 1923, 79 y.o.   MRN: [id number] Patient ID: [name], female   DOB: [birth year], 79 y.o.   MRN: [id number]  [facility]   Allergies  Allergen Reactions  . Advil [Ibuprofen] Other (See Comments)    "think it just doesn't work for her"  . Alprazolam Other (See Comments)    "don't remember what happens"  . Relafen [Nabumetone] Other (See Comments)    "don't remember what happens"  . Sulfa Antibiotics Other (See Comments)    "don't remember what happens"  . Toprol Xl [Metoprolol Succinate] Other (See Comments)    25mg ; "don't remember what happens"    Chief Complaint  Patient presents with  . Acute Visit    coumadin management     HPI  She is being seen for the management of her coumadin therapy. Her inr today is 3.0 she has afib with a heart valve replacement. She is tolerating coumadin without difficulty.   Past Medical History  Diagnosis Date  . Hypertension   . Hyperlipidemia   . Hypothyroidism   . Atrial fibrillation   . Pacemaker   . Shortness of breath   . CHF (congestive heart failure) 05/27/2006; 05/30/2006; 11/06/2008  . Vascular dementia     "due to CVA 06/08/2009"  . Stroke 03/06/2001; 05/30/2009    "light stroke"  . Stroke 06/08/2009    "probable small TIA"  . Stroke 01/06/2002  . Angina 12/22/2003  . Ventilator dependent 06/08/2009    respiratory failure  . H/O: rheumatic fever     "childhood"  . Coronary artery disease   . Left atrial enlargement   . H/O cardiomegaly     diastolic  . Pulmonary hypertension   . Anemia     "iron transfusions"  . Blood transfusion   . History of lower GI bleeding     "multiple; required blood transfusions"  . COPD (chronic obstructive pulmonary disease)   . GERD (gastroesophageal reflux disease)   . H/O hiatal hernia   . H/O chronic bronchitis   . Asthma   . Arthritis   . Anxiety   . Depression   . Dysphagia as late effect of stroke   . Autoimmune hepatitis 07/12/1999  .  Bilateral renal cysts     "2 large on right off the lower pole"  . Chronic renal insufficiency   . Pleural effusion     "moderate right & small left"  . Bacterial pneumonia 11/06/2008; 10/23/2009; 03/30/2010  . Avascular necrosis of femoral head     right  . A-fib 01/04/2013    Past Surgical History  Procedure Laterality Date  . Insert / replace / remove pacemaker  09/11/2000  . Av node ablation  "06/06/2005"  . Percutaneous tracheostomy  06/15/2009  . Electroencephalogram  01/28/2002; 11/26/2005; 10/23/2009  . Cardiac catheterization  06/14/1999; 12/26/2003; 05/30/2009  . Transcranial doppler & carotid ultrasound  11/28/2005  . Cardiac valve replacement  08/20/1999    "mitral valve replacement; [employer] Jude"  . Cardiac valve replacement  06/05/2009     "redo sternotomy & aortic valve replacement; tissue valve"  . Cauterized cecal av malformation    . Abdominal hysterectomy  1960's  . Dental implants removed  03/04/2001    lower  . Knee arthroscopy  12/26/2000    left  . Knee arthroscopy  07/29/2001    right    Current Outpatient Prescriptions on File Prior to Visit  Medication Sig Dispense Refill  .  acetaminophen (TYLENOL) 325 MG tablet Take 650 mg by mouth 2 (two) times daily.      Marland Kitchen artificial tears (LACRILUBE) OINT ophthalmic ointment Place 1 application into both eyes at bedtime.      . citalopram (CELEXA) 10 MG tablet Take 10 mg by mouth daily.      Marland Kitchen donepezil (ARICEPT) 10 MG tablet Take 10 mg by mouth at bedtime.      . furosemide (LASIX) 20 MG tablet Take 20 mg by mouth 2 (two) times daily.      Marland Kitchen ipratropium-albuterol (DUONEB) 0.5-2.5 (3) MG/3ML SOLN Take 3 mLs by nebulization every 6 (six) hours as needed. For shortness of breath      . levothyroxine (SYNTHROID, LEVOTHROID) 75 MCG tablet Take 75 mcg by mouth daily.      Marland Kitchen losartan (COZAAR) 50 MG tablet Take 50 mg by mouth daily.      Marland Kitchen omeprazole (PRILOSEC) 20 MG capsule Take 20 mg by mouth daily.      Ethelda Chick (OYSTER CALCIUM)  500 MG TABS Take 500 mg of elemental calcium by mouth 3 (three) times daily.      Marland Kitchen senna (SENOKOT) 8.6 MG TABS Take 2 tablets by mouth at bedtime.      Marland Kitchen tiotropium (SPIRIVA) 18 MCG inhalation capsule Place 18 mcg into inhaler and inhale daily.      . traMADol (ULTRAM) 50 MG tablet Take 25 mg by mouth 2 (two) times daily as needed. For pain      . warfarin (COUMADIN) 4 MG tablet Take 3.5 mg by mouth daily.       No current facility-administered medications on file prior to visit.    Filed Vitals:   05/12/13 1307  BP: 110/56  Pulse: 70  Height: 5' (1.524 m)  Weight: 127 lb (57.607 kg)     LABS REVIEWED:   01-14-13: wbc 4.9; hgb 10.4; hvct 31.4 ;mcv 90.2 ;plt 190; glucose 65; bun 22; creat 1.12; k+4.1; na++143 Liver normal 3.7; tsh 0.656  Review of Systems  Unable to perform ROS   Physical Exam  Constitutional:  thin  Neck: Neck supple. No tracheal deviation present. No thyromegaly present.  Cardiovascular: Normal rate, regular rhythm and intact distal pulses.   Respiratory: Effort normal and breath sounds normal. No respiratory distress. She has no wheezes.  GI: Soft. Bowel sounds are normal. She exhibits no distension. There is no tenderness.  Musculoskeletal: She exhibits no edema.  Neurological: She is alert.  Skin: Skin is warm and dry.   ASSESSMENT PLAN  Will continue her coumadin at 3.5 mg daily and will check inr in 1 week  and will continue to monitor her status

## 2013-07-20 NOTE — Progress Notes (Signed)
Patient ID: [name], female   DOB: February 08, 1923, 79 y.o.   MRN: [id number]  [facility]   Allergies  Allergen Reactions  . Advil [Ibuprofen] Other (See Comments)    "think it just doesn't work for her"  . Alprazolam Other (See Comments)    "don't remember what happens"  . Relafen [Nabumetone] Other (See Comments)    "don't remember what happens"  . Sulfa Antibiotics Other (See Comments)    "don't remember what happens"  . Toprol Xl [Metoprolol Succinate] Other (See Comments)    25mg ; "don't remember what happens"    Chief Complaint  Patient presents with  . Acute Visit    coumadin management     HPI  She is being seen for the management of her coumadin therapy. Her inr today is 3.2 she has afib with a heart valve replacement. She is tolerating coumadin without difficulty.   Past Medical History  Diagnosis Date  . Hypertension   . Hyperlipidemia   . Hypothyroidism   . Atrial fibrillation   . Pacemaker   . Shortness of breath   . CHF (congestive heart failure) 05/27/2006; 05/30/2006; 11/06/2008  . Vascular dementia     "due to CVA 06/08/2009"  . Stroke 03/06/2001; 05/30/2009    "light stroke"  . Stroke 06/08/2009    "probable small TIA"  . Stroke 01/06/2002  . Angina 12/22/2003  . Ventilator dependent 06/08/2009    respiratory failure  . H/O: rheumatic fever     "childhood"  . Coronary artery disease   . Left atrial enlargement   . H/O cardiomegaly     diastolic  . Pulmonary hypertension   . Anemia     "iron transfusions"  . Blood transfusion   . History of lower GI bleeding     "multiple; required blood transfusions"  . COPD (chronic obstructive pulmonary disease)   . GERD (gastroesophageal reflux disease)   . H/O hiatal hernia   . H/O chronic bronchitis   . Asthma   . Arthritis   . Anxiety   . Depression   . Dysphagia as late effect of stroke   . Autoimmune hepatitis 07/12/1999  . Bilateral renal cysts     "2 large on right off the lower pole"  . Chronic  renal insufficiency   . Pleural effusion     "moderate right & small left"  . Bacterial pneumonia 11/06/2008; 10/23/2009; 03/30/2010  . Avascular necrosis of femoral head     right    Past Surgical History  Procedure Laterality Date  . Insert / replace / remove pacemaker  09/11/2000  . Av node ablation  "06/06/2005"  . Percutaneous tracheostomy  06/15/2009  . Electroencephalogram  01/28/2002; 11/26/2005; 10/23/2009  . Cardiac catheterization  06/14/1999; 12/26/2003; 05/30/2009  . Transcranial doppler & carotid ultrasound  11/28/2005  . Cardiac valve replacement  08/20/1999    "mitral valve replacement; [employer]. Jude"  . Cardiac valve replacement  06/05/2009     "redo sternotomy & aortic valve replacement; tissue valve"  . Cauterized cecal av malformation    . Abdominal hysterectomy  1960's  . Dental implants removed  03/04/2001    lower  . Knee arthroscopy  12/26/2000    left  . Knee arthroscopy  07/29/2001    right    Current Outpatient Prescriptions on File Prior to Visit  Medication Sig Dispense Refill  . acetaminophen (TYLENOL) 325 MG tablet Take 650 mg by mouth 2 (two) times daily.      Marland Kitchen  artificial tears (LACRILUBE) OINT ophthalmic ointment Place 1 application into both eyes at bedtime.      . citalopram (CELEXA) 10 MG tablet Take 10 mg by mouth daily.      Marland Kitchen donepezil (ARICEPT) 10 MG tablet Take 10 mg by mouth at bedtime.      . furosemide (LASIX) 20 MG tablet Take 20 mg by mouth 2 (two) times daily.      Marland Kitchen ipratropium-albuterol (DUONEB) 0.5-2.5 (3) MG/3ML SOLN Take 3 mLs by nebulization every 6 (six) hours as needed. For shortness of breath      . levothyroxine (SYNTHROID, LEVOTHROID) 75 MCG tablet Take 75 mcg by mouth daily.      Marland Kitchen losartan (COZAAR) 50 MG tablet Take 50 mg by mouth daily.      Marland Kitchen omeprazole (PRILOSEC) 20 MG capsule Take 20 mg by mouth daily.      Ethelda Chick (OYSTER CALCIUM) 500 MG TABS Take 500 mg of elemental calcium by mouth 3 (three) times daily.      Marland Kitchen senna  (SENOKOT) 8.6 MG TABS Take 2 tablets by mouth at bedtime.      Marland Kitchen tiotropium (SPIRIVA) 18 MCG inhalation capsule Place 18 mcg into inhaler and inhale daily.      . traMADol (ULTRAM) 50 MG tablet Take 25 mg by mouth 2 (two) times daily as needed. For pain      . warfarin (COUMADIN) 4 MG tablet Take 3.5 mg by mouth daily.       No current facility-administered medications on file prior to visit.    Filed Vitals:   05/07/13 1246  BP: 110/80  Pulse: 70  Height: 5' (1.524 m)  Weight: 127 lb (57.607 kg)     LABS REVIEWED:   01-14-13: wbc 4.9; hgb 10.4; hvct 31.4 ;mcv 90.2 ;plt 190; glucose 65; bun 22; creat 1.12; k+4.1; na++143 Liver normal 3.7; tsh 0.656  Review of Systems  Unable to perform ROS   Physical Exam  Constitutional:  thin  Neck: Neck supple. No tracheal deviation present. No thyromegaly present.  Cardiovascular: Normal rate, regular rhythm and intact distal pulses.   Respiratory: Effort normal and breath sounds normal. No respiratory distress. She has no wheezes.  GI: Soft. Bowel sounds are normal. She exhibits no distension. There is no tenderness.  Musculoskeletal: She exhibits no edema.  Neurological: She is alert.  Skin: Skin is warm and dry.   ASSESSMENT PLAN  Will continue her coumadin at 3.5 mg daily and will check inr on 05-12-13 and will continue to monitor her status

## 2013-08-02 ENCOUNTER — Non-Acute Institutional Stay (SKILLED_NURSING_FACILITY): Payer: PRIVATE HEALTH INSURANCE | Admitting: Internal Medicine

## 2013-08-02 DIAGNOSIS — K219 Gastro-esophageal reflux disease without esophagitis: Secondary | ICD-10-CM

## 2013-08-02 DIAGNOSIS — I4891 Unspecified atrial fibrillation: Secondary | ICD-10-CM

## 2013-08-02 DIAGNOSIS — E039 Hypothyroidism, unspecified: Secondary | ICD-10-CM

## 2013-08-02 DIAGNOSIS — F0150 Vascular dementia without behavioral disturbance: Secondary | ICD-10-CM

## 2013-08-02 DIAGNOSIS — I119 Hypertensive heart disease without heart failure: Secondary | ICD-10-CM

## 2013-08-02 NOTE — Progress Notes (Signed)
Patient ID: [name], female   DOB: 1923/01/14, 79 y.o.   MRN: [id number]  [facility]  Code status- full code  Allergies  Allergen Reactions  . Advil [Ibuprofen] Other (See Comments)    "think it just doesn't work for her"  . Alprazolam Other (See Comments)    "don't remember what happens"  . Relafen [Nabumetone] Other (See Comments)    "don't remember what happens"  . Sulfa Antibiotics Other (See Comments)    "don't remember what happens"  . Toprol Xl [Metoprolol Succinate] Other (See Comments)    25mg ; "don't remember what happens"   Chief Complaint  Patient presents with  . Medical Managment of Chronic Issues   HPI:   79 y/o female patient is a long term care resident of the facility. She was seen in her room today and denies any complaints. No new concern from staff. She is on coumadin and baby aspirin.   Review of Systems   Constitutional: Negative for fever, chills and diaphoresis.   HENT: Negative for congestion.    Eyes: Negative for blurred vision.   Respiratory: Negative for cough, shortness of breath and wheezing.    Cardiovascular: Negative for chest pain and palpitations.   Gastrointestinal: Negative for heartburn, nausea and vomiting.   Genitourinary:        Urinary incontinence   Musculoskeletal: Negative for falls. Left sided weakness. On a wheelchair Skin: Negative for rash.   Neurological: Negative for dizziness, seizures, loss of consciousness, weakness and headaches.   Past Medical History  Diagnosis Date  . Hypertension   . Hyperlipidemia   . Hypothyroidism   . Atrial fibrillation   . Pacemaker   . Shortness of breath   . CHF (congestive heart failure) 05/27/2006; 05/30/2006; 11/06/2008  . Vascular dementia     "due to CVA 06/08/2009"  . Stroke 03/06/2001; 05/30/2009    "light stroke"  . Stroke 06/08/2009    "probable small TIA"  . Stroke 01/06/2002  . Angina 12/22/2003  . Ventilator dependent 06/08/2009    respiratory failure  .  H/O: rheumatic fever     "childhood"  . Coronary artery disease   . Left atrial enlargement   . H/O cardiomegaly     diastolic  . Pulmonary hypertension   . Anemia     "iron transfusions"  . Blood transfusion   . History of lower GI bleeding     "multiple; required blood transfusions"  . COPD (chronic obstructive pulmonary disease)   . GERD (gastroesophageal reflux disease)   . H/O hiatal hernia   . H/O chronic bronchitis   . Asthma   . Arthritis   . Anxiety   . Depression   . Dysphagia as late effect of stroke   . Autoimmune hepatitis 07/12/1999  . Bilateral renal cysts     "2 large on right off the lower pole"  . Chronic renal insufficiency   . Pleural effusion     "moderate right & small left"  . Bacterial pneumonia 11/06/2008; 10/23/2009; 03/30/2010  . Avascular necrosis of femoral head     right  . A-fib 01/04/2013   Past Surgical History  Procedure Laterality Date  . Insert / replace / remove pacemaker  09/11/2000  . Av node ablation  "06/06/2005"  . Percutaneous tracheostomy  06/15/2009  . Electroencephalogram  01/28/2002; 11/26/2005; 10/23/2009  . Cardiac catheterization  06/14/1999; 12/26/2003; 05/30/2009  . Transcranial doppler & carotid ultrasound  11/28/2005  . Cardiac valve replacement  08/20/1999    "  mitral valve replacement; [employer]. Jude"  . Cardiac valve replacement  06/05/2009     "redo sternotomy & aortic valve replacement; tissue valve"  . Cauterized cecal av malformation    . Abdominal hysterectomy  1960's  . Dental implants removed  03/04/2001    lower  . Knee arthroscopy  12/26/2000    left  . Knee arthroscopy  07/29/2001    right   Medication reviewed. See [facility]  Physical exam  BP 125/79  Pulse 73  Temp(Src) 96.8 F (36 C)  Resp 18  SpO2 99%  Constitutional:  Thin, in NAD Neck: Neck supple. No tracheal deviation present. No thyromegaly present.   Cardiovascular: Normal rate, regular rhythm and intact distal pulses.  pacemaker in right upper chest. Scar  from previous sternotomy Respiratory: Effort normal and breath sounds normal. No respiratory distress. She has no wheezes.   GI: Soft. Bowel sounds are normal. She exhibits no distension. There is no tenderness.  Musculoskeletal: She exhibits no edema. 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 Mild left hand contracture Neurological: She is alert and oriented to person   Skin: Skin is warm and dry.   Labs reviewed: 01-14-13: wbc 4.9; hgb 10.4; hvct 31.4 ;mcv 90.2 ;plt 190; glucose 65; bun 22; creat 1.12; k+4.1; na++143 Liver normal 3.7; tsh 0.656  06-15-13 wbc 5.2, hb 11, plt 211, na 139, k 4.1, bun 23, cr 1.3, glu 82, ca 10.2  07-19-13 wbc 4.9, hb 10.5, hct 33.5, plt 201, na 140, k 3.8, glu 88, bun 20, cr 1.1, lft wnl, tsh 0.5610  Assessment/Plan  afib- has a pacemaker and rate is currently controlled. Continue coumadin and monitor inr, goal inr 2-3  Hypothyroidism- reviewed tsh. Recently her levothyroxine has been decreased to 50 mcg daily from 75 mcg. Monitor tsh in a month  Depression- mood remains stable, continue celexa 10 mg daily for now  GERD- continue prilosec 20 mg daily will monitor   cva with hemiplegia- bp stable. Continue bp medication, aspirin, lipitor and coumadin  Essential HTN- bp is well controlled. On losartan 25 mg daily and lasix 20 mg bi  for now, monitor bmp.   CHF- breathing is stable, no signs of volume overload. Continue cozaar and lasix   COPD- Her status is stable and  will continue spiriva daily with duonebs  Dementia- vascular, continue aricept for now, stable, no behavioral changes  Chronic anticoagulation- Goal inr 2-3, continue coumadin and monitor inr   Cardiac pacemaker in situ- had new pacer placed. Tolerated it well. Will continue her anticoagulation  Arthritis- stable will continue tylenol 650 mg twice daily and ultram 50 mg twice daily as needed

## 2013-08-18 ENCOUNTER — Other Ambulatory Visit: Payer: Self-pay | Admitting: *Deleted

## 2013-08-18 MED ORDER — TRAMADOL HCL 50 MG PO TABS: ORAL_TABLET | ORAL | Status: DC

## 2013-08-19 ENCOUNTER — Non-Acute Institutional Stay (SKILLED_NURSING_FACILITY): Payer: PRIVATE HEALTH INSURANCE | Admitting: Internal Medicine

## 2013-08-19 ENCOUNTER — Encounter: Payer: Self-pay | Admitting: Internal Medicine

## 2013-08-19 ENCOUNTER — Other Ambulatory Visit: Payer: Self-pay

## 2013-08-19 DIAGNOSIS — I6350 Cerebral infarction due to unspecified occlusion or stenosis of unspecified cerebral artery: Secondary | ICD-10-CM

## 2013-08-19 DIAGNOSIS — I10 Essential (primary) hypertension: Secondary | ICD-10-CM

## 2013-08-19 DIAGNOSIS — I639 Cerebral infarction, unspecified: Secondary | ICD-10-CM

## 2013-08-19 DIAGNOSIS — I4891 Unspecified atrial fibrillation: Secondary | ICD-10-CM

## 2013-08-19 DIAGNOSIS — E039 Hypothyroidism, unspecified: Secondary | ICD-10-CM

## 2013-08-19 DIAGNOSIS — F329 Major depressive disorder, single episode, unspecified: Secondary | ICD-10-CM

## 2013-08-19 NOTE — Progress Notes (Signed)
Patient ID: [name], female   DOB: [birth year], 79 y.o.   MRN: [id number]  [facility]  Code status-full code  CC- routine visit  Allergies  Allergen Reactions  . Advil [Ibuprofen] Other (See Comments)    "think it just doesn't work for her"  . Alprazolam Other (See Comments)    "don't remember what happens"  . Relafen [Nabumetone] Other (See Comments)    "don't remember what happens"  . Sulfa Antibiotics Other (See Comments)    "don't remember what happens"  . Toprol Xl [Metoprolol Succinate] Other (See Comments)    25mg ; "don't remember what happens"    HPI:   79 y/o female patient is a long term care resident of the facility. She was seen in her room today and denies any complaints. No new concern from staff. No skin concerns. No falls reported  Review of Systems   Constitutional: Negative for fever, chills and diaphoresis.   HENT: Negative for congestion.    Eyes: Negative for blurred vision.   Respiratory: Negative for cough, shortness of breath and wheezing.    Cardiovascular: Negative for chest pain and palpitations.   Gastrointestinal: Negative for heartburn, nausea and vomiting.   Genitourinary:        Urinary incontinence   Musculoskeletal: Negative for falls. Left sided weakness. On a wheelchair Skin: Negative for rash.   Neurological: Negative for dizziness, seizures, loss of consciousness, weakness and headaches.     Past Medical History  Diagnosis Date  . Hypertension   . Hyperlipidemia   . Hypothyroidism   . Atrial fibrillation   . Pacemaker   . Shortness of breath   . CHF (congestive heart failure) 05/27/2006; 05/30/2006; 11/06/2008  . Vascular dementia     "due to CVA 06/08/2009"  . Stroke 03/06/2001; 05/30/2009    "light stroke"  . Stroke 06/08/2009    "probable small TIA"  . Stroke 01/06/2002  . Angina 12/22/2003  . Ventilator dependent 06/08/2009    respiratory failure  . H/O: rheumatic fever     "childhood"  . Coronary artery  disease   . Left atrial enlargement   . H/O cardiomegaly     diastolic  . Pulmonary hypertension   . Anemia     "iron transfusions"  . Blood transfusion   . History of lower GI bleeding     "multiple; required blood transfusions"  . COPD (chronic obstructive pulmonary disease)   . GERD (gastroesophageal reflux disease)   . H/O hiatal hernia   . H/O chronic bronchitis   . Asthma   . Arthritis   . Anxiety   . Depression   . Dysphagia as late effect of stroke   . Autoimmune hepatitis 07/12/1999  . Bilateral renal cysts     "2 large on right off the lower pole"  . Chronic renal insufficiency   . Pleural effusion     "moderate right & small left"  . Bacterial pneumonia 11/06/2008; 10/23/2009; 03/30/2010  . Avascular necrosis of femoral head     right  . A-fib 01/04/2013   Medication reviewed. See MAR  Physical exam  VSS  Constitutional:  Thin, in NAD Neck: Neck supple. No tracheal deviation present. No thyromegaly present.   Cardiovascular: Normal rate, regular rhythm and intact distal pulses.  pacemaker in right upper chest. Scar from previous sternotomy Respiratory: Effort normal and breath sounds normal. No respiratory distress. She has no wheezes.   GI: Soft. Bowel sounds are normal. She exhibits no  distension. There is no tenderness.  Musculoskeletal: She exhibits no edema. 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 Mild left hand contracture Neurological: She is alert and oriented to person   Skin: Skin is warm and dry.   Labs reviewed: 01-14-13: wbc 4.9; hgb 10.4; hvct 31.4 ;mcv 90.2 ;plt 190; glucose 65; bun 22; creat 1.12; k+4.1; na++143 Liver normal 3.7; tsh 0.656  06-15-13 wbc 5.2, hb 11, plt 211, na 139, k 4.1, bun 23, cr 1.3, glu 82, ca 10.2  07-19-13 wbc 4.9, hb 10.5, hct 33.5, plt 201, na 140, k 3.8, glu 88, bun 20, cr 1.1, lft wnl, tsh 0.5610  Assessment/Plan  afib- has a pacemaker and rate is currently controlled. Continue coumadin and monitor  inr, goal inr 2-3  Essential HTN- bp is well controlled. On losartan 25 mg daily and lasix 20 mg bi  for now, monitor bmp.   Hypothyroidism- reviewed tsh. Recently her levothyroxine has been decreased to 50 mcg daily from 75 mcg. Monitor tsh in a month  Depression- mood remains stable, continue celexa 10 mg daily for now  cva with hemiplegia- bp stable. Continue bp medication, aspirin, lipitor and coumadin  Arthritis- stable will continue tylenol 650 mg twice daily and ultram 50 mg twice daily as needed

## 2013-09-05 LAB — PACEMAKER DEVICE OBSERVATION

## 2013-09-06 ENCOUNTER — Ambulatory Visit: Payer: PRIVATE HEALTH INSURANCE

## 2013-09-06 LAB — MDC_IDC_ENUM_SESS_TYPE_REMOTE
Battery Voltage: 3.04 V
Brady Statistic RV Percent Paced: 92 %
Implantable Pulse Generator Model: 2240
Lead Channel Pacing Threshold Amplitude: 0.5 V
Lead Channel Pacing Threshold Pulse Width: 0.4 ms
Lead Channel Sensing Intrinsic Amplitude: 12 mV

## 2013-09-17 ENCOUNTER — Encounter: Payer: Self-pay | Admitting: *Deleted

## 2013-09-30 ENCOUNTER — Non-Acute Institutional Stay (SKILLED_NURSING_FACILITY): Payer: PRIVATE HEALTH INSURANCE | Admitting: Internal Medicine

## 2013-09-30 DIAGNOSIS — E039 Hypothyroidism, unspecified: Secondary | ICD-10-CM

## 2013-09-30 DIAGNOSIS — I4891 Unspecified atrial fibrillation: Secondary | ICD-10-CM

## 2013-09-30 DIAGNOSIS — F329 Major depressive disorder, single episode, unspecified: Secondary | ICD-10-CM

## 2013-09-30 DIAGNOSIS — I10 Essential (primary) hypertension: Secondary | ICD-10-CM

## 2013-10-06 NOTE — Progress Notes (Signed)
Patient ID: [name], female   DOB: 1922-12-01, 79 y.o.   MRN: [id number]    [facility]  Code status-full code  CC- routine visit  HPI:   79 y/o female patient is a long term care resident of the facility. She was seen in her room today and denies any complaints. No new concern from staff.   Review of Systems   Constitutional: Negative for fever, chills and diaphoresis.   HENT: Negative for congestion.    Eyes: Negative for blurred vision.   Respiratory: Negative for cough, shortness of breath and wheezing.    Cardiovascular: Negative for chest pain and palpitations.   Gastrointestinal: Negative for heartburn, nausea and vomiting.   Genitourinary:  Urinary incontinence   Musculoskeletal: Negative for falls. Left sided weakness. On a wheelchair Skin: Negative for rash.   Neurological: Negative for dizziness, seizures, loss of consciousness, weakness and headaches.    Past Medical History  Diagnosis Date  . Hypertension   . Hyperlipidemia   . Hypothyroidism   . Atrial fibrillation   . Pacemaker   . Shortness of breath   . CHF (congestive heart failure) 05/27/2006; 05/30/2006; 11/06/2008  . Vascular dementia     "due to CVA 06/08/2009"  . Stroke 03/06/2001; 05/30/2009    "light stroke"  . Stroke 06/08/2009    "probable small TIA"  . Stroke 01/06/2002  . Angina 12/22/2003  . Ventilator dependent 06/08/2009    respiratory failure  . H/O: rheumatic fever     "childhood"  . Coronary artery disease   . Left atrial enlargement   . H/O cardiomegaly     diastolic  . Pulmonary hypertension   . Anemia     "iron transfusions"  . Blood transfusion   . History of lower GI bleeding     "multiple; required blood transfusions"  . COPD (chronic obstructive pulmonary disease)   . GERD (gastroesophageal reflux disease)   . H/O hiatal hernia   . H/O chronic bronchitis   . Asthma   . Arthritis   . Anxiety   . Depression   . Dysphagia as late effect of stroke   .  Autoimmune hepatitis 07/12/1999  . Bilateral renal cysts     "2 large on right off the lower pole"  . Chronic renal insufficiency   . Pleural effusion     "moderate right & small left"  . Bacterial pneumonia 11/06/2008; 10/23/2009; 03/30/2010  . Avascular necrosis of femoral head     right  . A-fib 01/04/2013   Past Surgical History  Procedure Laterality Date  . Insert / replace / remove pacemaker  09/11/2000  . Av node ablation  "06/06/2005"  . Percutaneous tracheostomy  06/15/2009  . Electroencephalogram  01/28/2002; 11/26/2005; 10/23/2009  . Cardiac catheterization  06/14/1999; 12/26/2003; 05/30/2009  . Transcranial doppler & carotid ultrasound  11/28/2005  . Cardiac valve replacement  08/20/1999    "mitral valve replacement; St. Jude"  . Cardiac valve replacement  06/05/2009     "redo sternotomy & aortic valve replacement; tissue valve"  . Cauterized cecal av malformation    . Abdominal hysterectomy  1960's  . Dental implants removed  03/04/2001    lower  . Knee arthroscopy  12/26/2000    left  . Knee arthroscopy  07/29/2001    right   Medication reviewed. See [facility]  Physical exam BP 110/66  Pulse 74  Temp(Src) 98.1 F (36.7 C)  Resp 18  SpO2 95%  Constitutional: Thin, in  NAD Neck: Neck supple. No tracheal deviation present. No thyromegaly present.   Cardiovascular: Normal rate, regular rhythm and intact distal pulses.  pacemaker in right upper chest. Scar from previous sternotomy Respiratory: Effort normal and breath sounds normal. No respiratory distress. She has no wheezes.   GI: Soft. Bowel sounds are normal. She exhibits no distension. There is no tenderness.  Musculoskeletal: She exhibits no edema. 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 Mild left hand contracture Neurological: She is alert and oriented to person   Skin: Skin is warm and dry.   Labs reviewed: 01-14-13: wbc 4.9; hgb 10.4; hvct 31.4 ;mcv 90.2 ;plt 190; glucose 65; bun 22; creat 1.12; k+4.1;  na++143 Liver normal 3.7; tsh 0.656  06-15-13 wbc 5.2, hb 11, plt 211, na 139, k 4.1, bun 23, cr 1.3, glu 82, ca 10.2  07-19-13 wbc 4.9, hb 10.5, hct 33.5, plt 201, na 140, k 3.8, glu 88, bun 20, cr 1.1, lft wnl, tsh 0.5610  Assessment/Plan  Essential HTN- bp is well controlled. On losartan 25 mg daily and lasix 20 mg bid for now, monitor bmp.   Hypothyroidism- continue levothyroxine 50 mcg daily   Depression- mood remains stable, continue celexa 10 mg daily for now  afib- has a pacemaker and rate is currently controlled. Continue coumadin and monitor inr, goal inr 2-3

## 2013-10-25 ENCOUNTER — Telehealth: Payer: Self-pay | Admitting: Cardiovascular Disease

## 2013-10-25 NOTE — Telephone Encounter (Signed)
Daughter called -pt has an appt tomorrow. Her concern is that her mother is in a wheel chair and can not get in the exam table. She wants to know will she still be able to be examined all right? Please let her know something today please.

## 2013-10-25 NOTE — Telephone Encounter (Signed)
Returned call and pt verified x 2 w/ Corrie DandyMary, pt's daughter.  Informed message received and pt will be examined as best as possible in wheelchair.  Informed MD will determine what can be done when pt arrives.  Verbalized understanding and agreed w/ plan.

## 2013-10-26 ENCOUNTER — Ambulatory Visit (INDEPENDENT_AMBULATORY_CARE_PROVIDER_SITE_OTHER): Payer: PRIVATE HEALTH INSURANCE | Admitting: Cardiovascular Disease

## 2013-10-26 ENCOUNTER — Encounter: Payer: Self-pay | Admitting: Cardiovascular Disease

## 2013-10-26 VITALS — BP 115/76 | HR 74 | Ht 61.0 in

## 2013-10-26 DIAGNOSIS — I119 Hypertensive heart disease without heart failure: Secondary | ICD-10-CM

## 2013-10-26 DIAGNOSIS — I639 Cerebral infarction, unspecified: Secondary | ICD-10-CM

## 2013-10-26 DIAGNOSIS — Z950 Presence of cardiac pacemaker: Secondary | ICD-10-CM

## 2013-10-26 DIAGNOSIS — Z952 Presence of prosthetic heart valve: Secondary | ICD-10-CM

## 2013-10-26 DIAGNOSIS — F0150 Vascular dementia without behavioral disturbance: Secondary | ICD-10-CM

## 2013-10-26 DIAGNOSIS — Z954 Presence of other heart-valve replacement: Secondary | ICD-10-CM

## 2013-10-26 DIAGNOSIS — I4891 Unspecified atrial fibrillation: Secondary | ICD-10-CM

## 2013-10-26 DIAGNOSIS — I6350 Cerebral infarction due to unspecified occlusion or stenosis of unspecified cerebral artery: Secondary | ICD-10-CM

## 2013-10-26 DIAGNOSIS — I2510 Atherosclerotic heart disease of native coronary artery without angina pectoris: Secondary | ICD-10-CM

## 2013-10-26 NOTE — Patient Instructions (Signed)
Your physician recommends that you schedule a follow-up appointment in: 6 MONTHS. NO CHANGES WERE MADE TODAY IN YOUR THERAPY. 

## 2013-11-01 ENCOUNTER — Encounter: Payer: Self-pay | Admitting: Internal Medicine

## 2013-11-01 ENCOUNTER — Non-Acute Institutional Stay (SKILLED_NURSING_FACILITY): Payer: PRIVATE HEALTH INSURANCE | Admitting: Internal Medicine

## 2013-11-01 DIAGNOSIS — E039 Hypothyroidism, unspecified: Secondary | ICD-10-CM

## 2013-11-01 DIAGNOSIS — I639 Cerebral infarction, unspecified: Secondary | ICD-10-CM

## 2013-11-01 DIAGNOSIS — F3289 Other specified depressive episodes: Secondary | ICD-10-CM

## 2013-11-01 DIAGNOSIS — F329 Major depressive disorder, single episode, unspecified: Secondary | ICD-10-CM

## 2013-11-01 DIAGNOSIS — I6350 Cerebral infarction due to unspecified occlusion or stenosis of unspecified cerebral artery: Secondary | ICD-10-CM

## 2013-11-01 DIAGNOSIS — F32A Depression, unspecified: Secondary | ICD-10-CM

## 2013-11-01 DIAGNOSIS — I4891 Unspecified atrial fibrillation: Secondary | ICD-10-CM

## 2013-11-01 DIAGNOSIS — K219 Gastro-esophageal reflux disease without esophagitis: Secondary | ICD-10-CM

## 2013-11-01 NOTE — Progress Notes (Signed)
Patient ID: Geoffery LyonsMary L Warnke, female   DOB: 01/30/1923, 80 y.o.   MRN: [id number]    [facility]  Code status-full code  CC- routine visit  Allergies reviewed  HPI:   80 y/o female patient is a long term care resident of the facility. She was seen in her room today and denies any complaints. No new concern from staff. No skin concerns. No falls reported  Review of Systems   Constitutional: Negative for fever, chills and diaphoresis.   HENT: Negative for congestion.    Eyes: Negative for blurred vision.   Respiratory: Negative for cough, shortness of breath and wheezing.    Cardiovascular: Negative for chest pain and palpitations.   Gastrointestinal: Negative for heartburn, nausea and vomiting.   Genitourinary:        Urinary incontinence   Musculoskeletal: Negative for falls. Left sided weakness. On a wheelchair Skin: Negative for rash.   Neurological: Negative for dizziness, seizures, loss of consciousness, weakness and headaches.    Reviewed pmh  Medication reviewed. [facility]  Physical exam BP 125/82  Pulse 75  Temp(Src) 97.3 F (36.3 C)  Resp 16  SpO2 95%  constitutional:  Thin, in NAD Neck: Neck supple. No tracheal deviation present. No thyromegaly present.   Cardiovascular: Normal rate, regular rhythm and intact distal pulses.  pacemaker in right upper chest. Scar from previous sternotomy Respiratory: Effort normal and breath sounds normal. No respiratory distress. She has no wheezes.   GI: Soft. Bowel sounds are normal. She exhibits no distension. There is no tenderness.  Musculoskeletal: She exhibits no edema. 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 Mild left hand contracture Neurological: She is alert and oriented to person   Skin: Skin is warm and dry.   Labs reviewed: 01-14-13: wbc 4.9; hgb 10.4; hvct 31.4 ;mcv 90.2 ;plt 190; glucose 65; bun 22; creat 1.12; k+4.1; na++143 Liver normal 3.7; tsh 0.656  06-15-13 wbc 5.2, hb 11, plt  211, na 139, k 4.1, bun 23, cr 1.3, glu 82, ca 10.2  07-19-13 wbc 4.9, hb 10.5, hct 33.5, plt 201, na 140, k 3.8, glu 88, bun 20, cr 1.1, lft wnl, tsh 0.5610  09-07-13 iron 42, tibc 263, ferritin 167, wbc 6, hb 10, hct 33.7, plt 207  Assessment/Plan  afib- has a pacemaker and rate is currently controlled. Continue coumadin and monitor inr, goal inr 2-3 and currently on 3 mg daily. Follows with cardiology. Continue statin  Depression- mood remains stable, continue celexa 10 mg daily for now  GERD- continue prilosec 20 mg daily  Hypothyroidism- reviewed tsh. Continue levothyroxine 50 mcg daily. Check tsh  Essential HTN- bp is well controlled. On losartan 25 mg daily and lasix 20 mg bid for now, monitor bmp.   cva with hemiplegia- bp stable. Continue bp medication, aspirin, lipitor and coumadin  Arthritis- stable will continue tylenol 650 mg twice daily and ultram 50 mg twice daily as needed     Labs- tsh

## 2013-11-20 ENCOUNTER — Encounter: Payer: Self-pay | Admitting: Cardiovascular Disease

## 2013-11-20 DIAGNOSIS — Z952 Presence of prosthetic heart valve: Secondary | ICD-10-CM | POA: Insufficient documentation

## 2013-11-20 DIAGNOSIS — Z950 Presence of cardiac pacemaker: Secondary | ICD-10-CM | POA: Insufficient documentation

## 2013-11-20 NOTE — Progress Notes (Signed)
Patient ID: [name], female   DOB: [birth year], 80 y.o.   MRN: [id number]     HPI: [name] is a 80 y.o. female who is a former patient of Dr. Alanda Amass. She presents to the office today to reestablish care with me following her recent permanent pacemaker generator change her pacemaker was found to be end-of-life and August 2014.  Ms. Alegandra Sommers has remote history of manic fever during childhood. In 2001 she underwent St. Jude's mitral valve replacement and also at that time underwent permanent transvenous pacemaker for symptomatically bradycardic., She has undergone AV node ablation for atrial fibrillation with associated left atrial flutter and difficult to control ventricular response. Because of progressive aortic valve stenosis she underwent bioprosthetic valve replacement in August 2010. She initially tolerated surgery well, but unfortunately postoperatively developed a significant stroke. Subsequently, she has been in a nursing home and more recently asked to place apparently, she had not had any recent pacemaker followup with Dr. Alanda Amass had seen her last in August 2011 he was at pacemaker end-of-life. She was admitted to the hospital and her pacemaker immediately failed. She underwent an end-of-life generator change by Dr. Aldine Contes the world. Coumadin had been held was restarted postoperatively. She saw Dr. Alanda Amass in followup in September 2014.  The sponge is now wheelchair ridden. He never fully recovered from her significant right temporal infarct. She does have some dementia as well some chronic depression. She presents now to reestablish care with me.  Past Medical History  Diagnosis Date  . Hypertension   . Hyperlipidemia   . Hypothyroidism   . Atrial fibrillation   . Pacemaker   . Shortness of breath   . CHF (congestive heart failure) 05/27/2006; 05/30/2006; 11/06/2008  . Vascular dementia     "due to CVA 06/08/2009"  . Stroke 03/06/2001; 05/30/2009    "light stroke"  .  Stroke 06/08/2009    "probable small TIA"  . Stroke 01/06/2002  . Angina 12/22/2003  . Ventilator dependent 06/08/2009    respiratory failure  . H/O: rheumatic fever     "childhood"  . Coronary artery disease   . Left atrial enlargement   . H/O cardiomegaly     diastolic  . Pulmonary hypertension   . Anemia     "iron transfusions"  . Blood transfusion   . History of lower GI bleeding     "multiple; required blood transfusions"  . COPD (chronic obstructive pulmonary disease)   . GERD (gastroesophageal reflux disease)   . H/O hiatal hernia   . H/O chronic bronchitis   . Asthma   . Arthritis   . Anxiety   . Depression   . Dysphagia as late effect of stroke   . Autoimmune hepatitis 07/12/1999  . Bilateral renal cysts     "2 large on right off the lower pole"  . Chronic renal insufficiency   . Pleural effusion     "moderate right & small left"  . Bacterial pneumonia 11/06/2008; 10/23/2009; 03/30/2010  . Avascular necrosis of femoral head     right  . A-fib 01/04/2013    Past Surgical History  Procedure Laterality Date  . Insert / replace / remove pacemaker  09/11/2000  . Av node ablation  "06/06/2005"  . Percutaneous tracheostomy  06/15/2009  . Electroencephalogram  01/28/2002; 11/26/2005; 10/23/2009  . Cardiac catheterization  06/14/1999; 12/26/2003; 05/30/2009  . Transcranial doppler & carotid ultrasound  11/28/2005  . Cardiac valve replacement  08/20/1999    "mitral valve replacement;  St. Jude"  . Cardiac valve replacement  06/05/2009     "redo sternotomy & aortic valve replacement; tissue valve"  . Cauterized cecal av malformation    . Abdominal hysterectomy  1960's  . Dental implants removed  03/04/2001    lower  . Knee arthroscopy  12/26/2000    left  . Knee arthroscopy  07/29/2001    right    Allergies  Allergen Reactions  . Advil [Ibuprofen] Other (See Comments)    "think it just doesn't work for her"  . Alprazolam Other (See Comments)    "don't remember what happens"    . Relafen [Nabumetone] Other (See Comments)    "don't remember what happens"  . Sulfa Antibiotics Other (See Comments)    "don't remember what happens"  . Toprol Xl [Metoprolol Succinate] Other (See Comments)    25mg ; "don't remember what happens"    Current Outpatient Prescriptions  Medication Sig Dispense Refill  . acetaminophen (TYLENOL) 325 MG tablet Take 650 mg by mouth 2 (two) times daily.      Marland Kitchen artificial tears (LACRILUBE) OINT ophthalmic ointment Place 1 application into both eyes at bedtime.      Marland Kitchen aspirin 81 MG tablet Take 81 mg by mouth daily.      Marland Kitchen atorvastatin (LIPITOR) 10 MG tablet Take 10 mg by mouth daily.      . citalopram (CELEXA) 10 MG tablet Take 10 mg by mouth daily.      Marland Kitchen donepezil (ARICEPT) 10 MG tablet Take 10 mg by mouth at bedtime.      . furosemide (LASIX) 20 MG tablet Take 20 mg by mouth 2 (two) times daily.      Marland Kitchen ipratropium-albuterol (DUONEB) 0.5-2.5 (3) MG/3ML SOLN Take 3 mLs by nebulization every 6 (six) hours as needed. For shortness of breath      . levothyroxine (SYNTHROID, LEVOTHROID) 50 MCG tablet Take 50 mcg by mouth daily before breakfast.      . losartan (COZAAR) 25 MG tablet Take 25 mg by mouth daily.      Marland Kitchen omeprazole (PRILOSEC) 20 MG capsule Take 20 mg by mouth daily.      Ethelda Chick (OYSTER CALCIUM) 500 MG TABS Take 500 mg of elemental calcium by mouth 3 (three) times daily.      Marland Kitchen senna (SENOKOT) 8.6 MG TABS Take 2 tablets by mouth at bedtime.      Marland Kitchen tiotropium (SPIRIVA) 18 MCG inhalation capsule Place 18 mcg into inhaler and inhale daily.      . traMADol (ULTRAM) 50 MG tablet Take 1/2 tablet by mouth twice daily for OA pain; Take 1/2 tablet by mouth twice daily as needed for pain  60 tablet  5  . warfarin (JANTOVEN) 3 MG tablet Take 3 mg by mouth daily.       No current facility-administered medications for this visit.    History   Social History  . Marital Status: Widowed    Spouse Name: N/A    Number of Children: N/A  .  Years of Education: N/A   Occupational History  . Not on file.   Social History Main Topics  . Smoking status: Former Smoker -- 1.00 packs/day for 30 years    Types: Cigarettes    Quit date: 10/14/1970  . Smokeless tobacco: Never Used  . Alcohol Use: No  . Drug Use: No  . Sexual Activity: No   Other Topics Concern  . Not on file   Social History Narrative  .  No narrative on file    History reviewed. No pertinent family history.  ROS is negative for fevers, chills or night sweats.  She essentially is wheelchair bound. She denies recent vision changes. She does admit to chronic weakness and fatigue. She is unaware of palpitations. She denies syncope. She denies chest pressure. She is unaware of blood in stool or urine. There is no significant leg swelling. She does have a history of hypothyroidism. Is also history of hyperlipidemia. There is no diabetes. Other comprehensive 14 point system review is negative.  PE BP 115/76  Pulse 74  Ht 5\' 1"  (1.549 m)  General: Alert, oriented, no distress; appears frail with significant thoracic kyphosis. Skin: normal turgor, no rashes HEENT: Normocephalic, atraumatic. Pupils round and reactive; sclera anicteric;no lid lag. Extraocular muscles intact. No xanthelasmas Nose without nasal septal hypertrophy Mouth/Parynx benign; Mallinpatti scale 3 Neck: No JVD, no carotid bruits; normal carotid upstroke Lungs: clear to ausculatation and percussion; no wheezing or rales Chest wall: no tenderness to palpitation Heart: RRR, s1 s2 normal 2/6 systolic murmur with crisp valve clicks. No S3 gallop. No diastolic murmur of aortic insufficiency.  Abdomen: soft, nontender; no hepatosplenomehaly, BS+; abdominal aorta nontender and not dilated by palpation. Back: no CVA tenderness; significant thoracic kyphosis. Pulses 2+ Extremities: no clubbing cyanosis or edema, Homan's sign negative  Neurologic: grossly nonfocal; cranial nerves grossly  normal. Psychologic: depressed affect  ECG (independently read by me): Ventricular paced rhythm at 74 beats per minute.  LABS:  BMET    Component Value Date/Time   NA 141 05/21/2013 1100   K 3.9 05/21/2013 1100   CL 102 05/21/2013 1100   CO2 32 05/21/2013 1100   GLUCOSE 87 05/21/2013 1100   BUN 28* 05/21/2013 1100   CREATININE 1.37* 05/21/2013 1100   CALCIUM 10.3 05/21/2013 1100   GFRNONAA 34* 05/21/2013 1100   GFRAA 39* 05/21/2013 1100     Hepatic Function Panel     Component Value Date/Time   PROT 8.8* 06/09/2012 0558   ALBUMIN 3.8 06/09/2012 0558   AST 29 06/09/2012 0558   ALT 10 06/09/2012 0558   ALKPHOS 87 06/09/2012 0558   BILITOT 1.0 06/09/2012 0558     CBC    Component Value Date/Time   WBC 5.5 05/28/2013 0425   WBC 4.1 12/28/2008 1146   RBC 3.80* 05/28/2013 0425   RBC 4.01 12/28/2008 1146   RBC 2.51* 07/27/2008 1745   HGB 11.5* 05/28/2013 0425   HGB 10.8* 12/28/2008 1146   HCT 35.2* 05/28/2013 0425   HCT 32.9* 12/28/2008 1146   PLT 173 05/28/2013 0425   PLT 201 12/28/2008 1146   MCV 92.6 05/28/2013 0425   MCV 82.1 12/28/2008 1146   MCH 30.3 05/28/2013 0425   MCH 26.9 12/28/2008 1146   MCHC 32.7 05/28/2013 0425   MCHC 32.8 12/28/2008 1146   RDW 13.3 05/28/2013 0425   RDW 20.7* 12/28/2008 1146   LYMPHSABS 0.7 06/08/2012 1853   LYMPHSABS 0.4* 12/28/2008 1146   MONOABS 0.3 06/08/2012 1853   MONOABS 0.4 12/28/2008 1146   EOSABS 0.1 06/08/2012 1853   EOSABS 0.1 12/28/2008 1146   BASOSABS 0.0 06/08/2012 1853   BASOSABS 0.0 12/28/2008 1146     BNP    Component Value Date/Time   PROBNP 143.0* 02/17/2010 1311    Lipid Panel     Component Value Date/Time   CHOL 200 02/05/2012 0550   TRIG 81 02/05/2012 0550   HDL 55 02/05/2012 0550   CHOLHDL 3.6 02/05/2012 0550  VLDL 16 02/05/2012 0550   LDLCALC 129* 02/05/2012 0550     RADIOLOGY: No results found.    ASSESSMENT AND PLAN:  Ms. Aniceto BossMary Aramburo is now 80 years old. She did suffer rheumatic fever during childhood. She is now 14 years status  post St. Jude mitral valve replacement and almost 4-1/2 years status post bioprosthetic aortic valve replacement for development of severe aortic valvular stenosis. She had her initial pacemaker implanted in 2001. In August 2014 her pacemaker was end-of-life and essentially failed. Fortunately she was hospitalized after Dr. Alanda AmassWeintraub evaluated her in the office and she underwent explantation of her St. Jude Assurity generator which was implanted on 09/11/2000 and a new generator St. Jude integrity AFxDR was implanted by Dr. Royann Shiversroitoru. At present she is not having heart failure symptoms. She has good pacemaker function. Her blood pressure is well-controlled. She rhythm he. She does have some dementia and has a depressed affect. Her cardiac rhythm is stable. I have recommended remote telephonic pacemaker interrogation at three-month intervals.    Lennette Biharihomas A. Zaim Nitta, MD, [facility] HospitalFACC  11/20/2013 10:29 AM

## 2013-11-29 ENCOUNTER — Non-Acute Institutional Stay (SKILLED_NURSING_FACILITY): Payer: PRIVATE HEALTH INSURANCE | Admitting: Internal Medicine

## 2013-11-29 DIAGNOSIS — I4891 Unspecified atrial fibrillation: Secondary | ICD-10-CM

## 2013-11-29 DIAGNOSIS — I10 Essential (primary) hypertension: Secondary | ICD-10-CM

## 2013-11-29 DIAGNOSIS — E039 Hypothyroidism, unspecified: Secondary | ICD-10-CM

## 2013-11-29 DIAGNOSIS — F32A Depression, unspecified: Secondary | ICD-10-CM

## 2013-11-29 DIAGNOSIS — F3289 Other specified depressive episodes: Secondary | ICD-10-CM

## 2013-11-29 DIAGNOSIS — F329 Major depressive disorder, single episode, unspecified: Secondary | ICD-10-CM

## 2013-11-29 NOTE — Progress Notes (Signed)
Patient ID: Savannah LyonsMary L [name], female   DOB: May 05, 1923, 80 y.o.   MRN: [id number]    [facility]  Code status-full code  CC- routine visit  Allergies reviewed  HPI 80 y/o female patient is a long term care resident of the facility. She was seen in her room today and denies any complaints. No new concern from staff. No skin concerns. No falls reported. Continues to be on coumadin  Review of Systems   Constitutional: Negative for fever, chills and diaphoresis.   HENT: Negative for congestion.    Eyes: Negative for blurred vision.   Respiratory: Negative for cough, shortness of breath and wheezing.    Cardiovascular: Negative for chest pain and palpitations.   Gastrointestinal: Negative for heartburn, nausea and vomiting.   Genitourinary:        Urinary incontinence   Musculoskeletal: Negative for falls. Left sided weakness. On a wheelchair Skin: Negative for rash.   Neurological: Negative for dizziness, seizures, loss of consciousness, weakness and headaches.    Reviewed pmh  Medication reviewed. [facility]  Physical exam BP 142/63  Pulse 72  Temp(Src) 99.1 F (37.3 C)  Resp 18  SpO2 96%  constitutional:  Thin, in NAD Neck: Neck supple. No tracheal deviation present. No thyromegaly present.   Cardiovascular: Normal rate, regular rhythm and intact distal pulses.  pacemaker in right upper chest. Scar from previous sternotomy Respiratory: Effort normal and breath sounds normal. No respiratory distress. She has no wheezes.   GI: Soft. Bowel sounds are normal. She exhibits no distension. There is no tenderness.  Musculoskeletal: She exhibits no edema. 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 Mild left hand contracture Neurological: She is alert and oriented to person   Skin: Skin is warm and dry.   Labs reviewed: 01-14-13: wbc 4.9; hgb 10.4; hvct 31.4 ;mcv 90.2 ;plt 190; glucose 65; bun 22; creat 1.12; k+4.1; na++143 Liver normal 3.7; tsh  0.656  06-15-13 wbc 5.2, hb 11, plt 211, na 139, k 4.1, bun 23, cr 1.3, glu 82, ca 10.2  07-19-13 wbc 4.9, hb 10.5, hct 33.5, plt 201, na 140, k 3.8, glu 88, bun 20, cr 1.1, lft wnl, tsh 0.5610  09-07-13 iron 42, tibc 263, ferritin 167, wbc 6, hb 10, hct 33.7, plt 207  Assessment/Plan  Hypothyroidism- reviewed tsh. Continue levothyroxine 50 mcg daily. tsh 1.4  Depression- mood remains stable, continue celexa 10 mg daily for now  afib- has a pacemaker and rate is currently controlled. Continue coumadin and monitor inr, goal inr 2-3 and currently on 3 mg daily. Follows with cardiology. Continue statin  Essential HTN- bp is well controlled. On losartan 25 mg daily and lasix 20 mg bid for now, monitor bmp. Continue aspirin   Oneal GroutMAHIMA Alzora Ha, MD  [facility] [phone] (Monday-Friday 8 am - 5 pm) [phone] (afterhours)

## 2013-12-06 ENCOUNTER — Ambulatory Visit (INDEPENDENT_AMBULATORY_CARE_PROVIDER_SITE_OTHER): Payer: PRIVATE HEALTH INSURANCE

## 2013-12-06 DIAGNOSIS — I4891 Unspecified atrial fibrillation: Secondary | ICD-10-CM

## 2013-12-20 ENCOUNTER — Non-Acute Institutional Stay (SKILLED_NURSING_FACILITY): Payer: PRIVATE HEALTH INSURANCE | Admitting: Internal Medicine

## 2013-12-20 ENCOUNTER — Encounter: Payer: Self-pay | Admitting: Internal Medicine

## 2013-12-20 DIAGNOSIS — Z7901 Long term (current) use of anticoagulants: Secondary | ICD-10-CM

## 2013-12-20 DIAGNOSIS — I739 Peripheral vascular disease, unspecified: Secondary | ICD-10-CM

## 2013-12-20 DIAGNOSIS — E785 Hyperlipidemia, unspecified: Secondary | ICD-10-CM

## 2013-12-20 DIAGNOSIS — F339 Major depressive disorder, recurrent, unspecified: Secondary | ICD-10-CM

## 2013-12-20 DIAGNOSIS — I4891 Unspecified atrial fibrillation: Secondary | ICD-10-CM

## 2013-12-20 DIAGNOSIS — E890 Postprocedural hypothyroidism: Secondary | ICD-10-CM

## 2013-12-20 NOTE — Progress Notes (Signed)
Patient ID: [name], female   DOB: 03-23-1923, 80 y.o.   MRN: [id number]    [facility]  Code status-full code  CC- routine visit  Allergies reviewed  HPI 80 y/o female patient is a long term care resident of the facility. She was seen in her room today and denies any complaints. She is at her baseline, on o2 and coumadin.No new concern from staff. No skin concerns. No falls reported. She has a pacer in place. She is alert to make her needs known  Review of Systems   Constitutional: Negative for fever, chills and diaphoresis.   HENT: Negative for congestion.    Eyes: Negative for blurred vision.   Respiratory: Negative for cough, shortness of breath and wheezing.    Cardiovascular: Negative for chest pain and palpitations.   Gastrointestinal: Negative for heartburn, nausea and vomiting.   Genitourinary:        Urinary incontinence   Musculoskeletal: Negative for falls. Left sided weakness. On a wheelchair Skin: Negative for rash.   Neurological: Negative for dizziness, seizures, loss of consciousness, weakness and headaches.    Past Medical History  Diagnosis Date  . Hypertension   . Hyperlipidemia   . Hypothyroidism   . Atrial fibrillation   . Pacemaker   . Shortness of breath   . CHF (congestive heart failure) 05/27/2006; 05/30/2006; 11/06/2008  . Vascular dementia     "due to CVA 06/08/2009"  . Stroke 03/06/2001; 05/30/2009    "light stroke"  . Stroke 06/08/2009    "probable small TIA"  . Stroke 01/06/2002  . Angina 12/22/2003  . Ventilator dependent 06/08/2009    respiratory failure  . H/O: rheumatic fever     "childhood"  . Coronary artery disease   . Left atrial enlargement   . H/O cardiomegaly     diastolic  . Pulmonary hypertension   . Anemia     "iron transfusions"  . Blood transfusion   . History of lower GI bleeding     "multiple; required blood transfusions"  . COPD (chronic obstructive pulmonary disease)   . GERD (gastroesophageal  reflux disease)   . H/O hiatal hernia   . H/O chronic bronchitis   . Asthma   . Arthritis   . Anxiety   . Depression   . Dysphagia as late effect of stroke   . Autoimmune hepatitis 07/12/1999  . Bilateral renal cysts     "2 large on right off the lower pole"  . Chronic renal insufficiency   . Pleural effusion     "moderate right & small left"  . Bacterial pneumonia 11/06/2008; 10/23/2009; 03/30/2010  . Avascular necrosis of femoral head     right  . A-fib 01/04/2013   Current Outpatient Prescriptions on File Prior to Visit  Medication Sig Dispense Refill  . acetaminophen (TYLENOL) 325 MG tablet Take 650 mg by mouth 2 (two) times daily.      Marland Kitchen artificial tears (LACRILUBE) OINT ophthalmic ointment Place 1 application into both eyes at bedtime.      Marland Kitchen aspirin 81 MG tablet Take 81 mg by mouth daily.      Marland Kitchen atorvastatin (LIPITOR) 10 MG tablet Take 10 mg by mouth daily.      . citalopram (CELEXA) 10 MG tablet Take 10 mg by mouth daily.      Marland Kitchen donepezil (ARICEPT) 10 MG tablet Take 10 mg by mouth at bedtime.      . furosemide (LASIX) 20 MG tablet Take  20 mg by mouth 2 (two) times daily.      Marland Kitchen. ipratropium-albuterol (DUONEB) 0.5-2.5 (3) MG/3ML SOLN Take 3 mLs by nebulization every 6 (six) hours as needed. For shortness of breath      . levothyroxine (SYNTHROID, LEVOTHROID) 50 MCG tablet Take 50 mcg by mouth daily before breakfast.      . losartan (COZAAR) 25 MG tablet Take 25 mg by mouth daily.      Marland Kitchen. omeprazole (PRILOSEC) 20 MG capsule Take 20 mg by mouth daily.      Ethelda Chick. Oyster Shell (OYSTER CALCIUM) 500 MG TABS Take 500 mg of elemental calcium by mouth 3 (three) times daily.      Marland Kitchen. senna (SENOKOT) 8.6 MG TABS Take 2 tablets by mouth at bedtime.      Marland Kitchen. tiotropium (SPIRIVA) 18 MCG inhalation capsule Place 18 mcg into inhaler and inhale daily.      . traMADol (ULTRAM) 50 MG tablet Take 1/2 tablet by mouth twice daily for OA pain; Take 1/2 tablet by mouth twice daily as needed for pain  60 tablet  5   . warfarin (JANTOVEN) 3 MG tablet Take 2.5 mg by mouth daily.        No current facility-administered medications on file prior to visit.   Past Surgical History  Procedure Laterality Date  . Insert / replace / remove pacemaker  09/11/2000  . Av node ablation  "06/06/2005"  . Percutaneous tracheostomy  06/15/2009  . Electroencephalogram  01/28/2002; 11/26/2005; 10/23/2009  . Cardiac catheterization  06/14/1999; 12/26/2003; 05/30/2009  . Transcranial doppler & carotid ultrasound  11/28/2005  . Cardiac valve replacement  08/20/1999    "mitral valve replacement; [employer]. Jude"  . Cardiac valve replacement  06/05/2009     "redo sternotomy & aortic valve replacement; tissue valve"  . Cauterized cecal av malformation    . Abdominal hysterectomy  1960's  . Dental implants removed  03/04/2001    lower  . Knee arthroscopy  12/26/2000    left  . Knee arthroscopy  07/29/2001    right    Physical exam BP 138/83  Pulse 76  Temp(Src) 98 F (36.7 C)  Resp 17  SpO2 98%  constitutional:  Thin, in NAD Neck: Neck supple. No tracheal deviation present. No thyromegaly present.   Cardiovascular: Normal rate, regular rhythm and intact distal pulses.  pacemaker in right upper chest. Scar from previous sternotomy Respiratory: Effort normal and breath sounds normal. No respiratory distress. She has no wheezes.   GI: Soft. Bowel sounds are normal. She exhibits no distension. There is no tenderness.  Musculoskeletal: She exhibits no edema. 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 Mild left hand contracture Neurological: She is alert and oriented to person   Skin: Skin is warm and dry.   Labs reviewed: 01-14-13: wbc 4.9; hgb 10.4; hvct 31.4 ;mcv 90.2 ;plt 190; glucose 65; bun 22; creat 1.12; k+4.1; na++143 Liver normal 3.7; tsh 0.656  06-15-13 wbc 5.2, hb 11, plt 211, na 139, k 4.1, bun 23, cr 1.3, glu 82, ca 10.2  07-19-13 wbc 4.9, hb 10.5, hct 33.5, plt 201, na 140, k 3.8, glu 88, bun 20, cr 1.1,  lft wnl, tsh 0.5610  09-07-13 iron 42, tibc 263, ferritin 167, wbc 6, hb 10, hct 33.7, plt 207  11-02-13 tsh 1.910  Assessment/Plan  PVD Continue low dose aspirin, pressure ulcer prophylaxis. Monitor for open sores  Hyperlipidemia Continue lipitor for now. Continue aspirin  Hypothyroidism reviewed tsh.  Hx of partial resection. Continue levothyroxine 50 mcg daily.   Depression mood remains stable, continue celexa 10 mg daily for now  Long term anticoagulation Continue coumadin and monitor inr  afib has a pacemaker and rate is currently controlled. Continue coumadin and monitor inr, goal inr 2-3. Continue statin as well  Essential HTN bp is well controlled. On losartan 25 mg daily and lasix 20 mg bid for now, monitor bmp. Continue aspirin   Oneal Grout, MD  [facility] [phone] (Monday-Friday 8 am - 5 pm) [phone] (afterhours)

## 2013-12-22 LAB — PACEMAKER DEVICE OBSERVATION

## 2013-12-22 LAB — MDC_IDC_ENUM_SESS_TYPE_REMOTE
Battery Voltage: 3.02 V
Date Time Interrogation Session: 20150222090013
Implantable Pulse Generator Model: 2240
Implantable Pulse Generator Serial Number: 7529661
Lead Channel Pacing Threshold Amplitude: 0.625 V
Lead Channel Pacing Threshold Pulse Width: 0.4 ms
Lead Channel Setting Sensing Sensitivity: 2 mV
MDC IDC MSMT BATTERY REMAINING LONGEVITY: 140 mo
MDC IDC MSMT LEADCHNL RV IMPEDANCE VALUE: 680 Ohm
MDC IDC MSMT LEADCHNL RV SENSING INTR AMPL: 12 mV
MDC IDC SET LEADCHNL RV PACING AMPLITUDE: 0.875
MDC IDC SET LEADCHNL RV PACING PULSEWIDTH: 0.4 ms
MDC IDC STAT BRADY RV PERCENT PACED: 92 %

## 2013-12-31 ENCOUNTER — Encounter: Payer: Self-pay | Admitting: *Deleted

## 2014-01-17 LAB — HEPATIC FUNCTION PANEL
ALT: 11 U/L (ref 7–35)
AST: 24 U/L (ref 13–35)

## 2014-01-24 ENCOUNTER — Telehealth: Payer: Self-pay | Admitting: Neurology

## 2014-01-24 NOTE — Telephone Encounter (Signed)
Patient's daughter calling to ask whether it is okay to push back her mother's follow up appointment (currently scheduled for 02/08/14) since she is doing so well on her Aricept. Please call and advise patient.

## 2014-01-25 NOTE — Telephone Encounter (Signed)
Called pt and spoke with pt's daughter Corrie DandyMary informing her per last OV on 02/09/13, per Eber Jonesarolyn, NP, pt was to be seen yearly and pt is scheduled to come in on 02/08/14. Pt's daughter verbalized understanding.

## 2014-02-07 ENCOUNTER — Non-Acute Institutional Stay (SKILLED_NURSING_FACILITY): Payer: PRIVATE HEALTH INSURANCE | Admitting: Internal Medicine

## 2014-02-07 DIAGNOSIS — N183 Chronic kidney disease, stage 3 unspecified: Secondary | ICD-10-CM

## 2014-02-07 DIAGNOSIS — I130 Hypertensive heart and chronic kidney disease with heart failure and stage 1 through stage 4 chronic kidney disease, or unspecified chronic kidney disease: Secondary | ICD-10-CM

## 2014-02-07 DIAGNOSIS — N039 Chronic nephritic syndrome with unspecified morphologic changes: Secondary | ICD-10-CM

## 2014-02-07 DIAGNOSIS — I509 Heart failure, unspecified: Secondary | ICD-10-CM

## 2014-02-07 NOTE — Progress Notes (Signed)
Patient ID: [name], female   DOB: 1922/12/15, 80 y.o.   MRN: [id number]    [facility]  Code status-full code  CC- routine visit  Allergies reviewed  HPI 80 y/o female patient is a long term care resident of the facility. She was seen in her room today and denies any complaints. No new concern from staff. No skin concerns. No falls reported. Continues to be on coumadin  Review of Systems   Constitutional: Negative for fever, chills and diaphoresis.   Respiratory: Negative for cough, shortness of breath and wheezing.    Cardiovascular: Negative for chest pain and palpitations.   Gastrointestinal: Negative for heartburn, nausea and vomiting.   Genitourinary:        Urinary incontinence   Musculoskeletal: Negative for falls.  Skin: Negative for rash.   Neurological: Negative for dizziness, seizures, loss of consciousness, weakness and headaches.    Reviewed pmh  Medication reviewed. See [facility]  Physical exam BP 120/72  Pulse 75  Temp(Src) 97.4 F (36.3 C)  Resp 18  SpO2 95%  constitutional: Thin, in NAD Neck: Neck supple. No tracheal deviation present. No thyromegaly present.   Cardiovascular: Normal rate, regular rhythm and intact distal pulses.  pacemaker in right upper chest. Scar from previous sternotomy Respiratory: Effort normal and breath sounds normal. No respiratory distress. She has no wheezes.   GI: Soft. Bowel sounds are normal. She exhibits no distension. There is no tenderness.  Musculoskeletal: She exhibits no edema. 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 Mild left hand contracture Neurological: She is alert and oriented to person   Skin: Skin is warm and dry.   Labs reviewed: 01-14-13: wbc 4.9; hgb 10.4; hvct 31.4 ;mcv 90.2 ;plt 190; glucose 65; bun 22; creat 1.12; k+4.1; na++143 Liver normal 3.7; tsh 0.656  06-15-13 wbc 5.2, hb 11, plt 211, na 139, k 4.1, bun 23, cr 1.3, glu 82, ca 10.2  07-19-13 wbc 4.9, hb 10.5, hct  33.5, plt 201, na 140, k 3.8, glu 88, bun 20, cr 1.1, lft wnl, tsh 0.5610  09-07-13 iron 42, tibc 263, ferritin 167, wbc 6, hb 10, hct 33.7, plt 207  Assessment/Plan  chf Stable, continue her cozaar and lasix current regimen with o2. Breathing stable.  ckd On ARB and diuretics,  Monitor renal function, avoid NSAIDS  HTN bp stable, no med changes made. Continue aspirin   Oneal GroutMAHIMA Kodah Maret, MD  [facility] [phone] (Monday-Friday 8 am - 5 pm) [phone] (afterhours)

## 2014-02-08 ENCOUNTER — Ambulatory Visit (INDEPENDENT_AMBULATORY_CARE_PROVIDER_SITE_OTHER): Payer: PRIVATE HEALTH INSURANCE | Admitting: Nurse Practitioner

## 2014-02-08 ENCOUNTER — Ambulatory Visit: Payer: Medicare Other | Admitting: Nurse Practitioner

## 2014-02-08 ENCOUNTER — Encounter: Payer: Self-pay | Admitting: Nurse Practitioner

## 2014-02-08 VITALS — BP 140/82 | HR 73

## 2014-02-08 DIAGNOSIS — F0150 Vascular dementia without behavioral disturbance: Secondary | ICD-10-CM

## 2014-02-08 DIAGNOSIS — I6350 Cerebral infarction due to unspecified occlusion or stenosis of unspecified cerebral artery: Secondary | ICD-10-CM

## 2014-02-08 DIAGNOSIS — I639 Cerebral infarction, unspecified: Secondary | ICD-10-CM

## 2014-02-08 NOTE — Patient Instructions (Signed)
Per skilled facility sheet 

## 2014-02-08 NOTE — Progress Notes (Signed)
[facility]  PATIENT: Savannah LyonsMary L [name] DOB: [birth year]   REASON FOR VISIT: follow up for dementia   HISTORY OF PRESENT ILLNESS: Ms. Savannah RogersBunch, 80 year old female returns for followup. She has a history of large right MCA stroke which occurred in August 2010 after an aortic valve replacement thought  cardioembolic due to atrial fibrillation. She has a vascular dementia and is currently on Aricept without side effects. She is residing in a skilled facility. She is nonambulatory. She is dependent for all activities of daily living. She has no new neurologic complaints per her daughter who accompanies her today .    REVIEW OF SYSTEMS: Full 14 system review of systems performed and notable only for those listed, all others are neg:  Constitutional: N/A  Cardiovascular: N/A  Ear/Nose/Throat: N/A  Skin: N/A  Eyes: N/A  Respiratory: N/A  Gastroitestinal: N/A  Hematology/Lymphatic: N/A  Endocrine: N/A Musculoskeletal:N/A  Allergy/Immunology: N/A  Neurological: N/A Psychiatric: N/A   ALLERGIES: Allergies  Allergen Reactions  . Advil [Ibuprofen] Other (See Comments)    "think it just doesn't work for her"  . Alprazolam Other (See Comments)    "don't remember what happens"  . Relafen [Nabumetone] Other (See Comments)    "don't remember what happens"  . Sulfa Antibiotics Other (See Comments)    "don't remember what happens"  . Toprol Xl [Metoprolol Succinate] Other (See Comments)    25mg ; "don't remember what happens"    HOME MEDICATIONS: Outpatient Prescriptions Prior to Visit  Medication Sig Dispense Refill  . acetaminophen (TYLENOL) 325 MG tablet Take 650 mg by mouth 2 (two) times daily.      Marland Kitchen. artificial tears (LACRILUBE) OINT ophthalmic ointment Place 1 application into both eyes at bedtime.      Marland Kitchen. aspirin 81 MG tablet Take 81 mg by mouth daily.      Marland Kitchen. atorvastatin (LIPITOR) 10 MG tablet Take 10 mg by mouth daily.      . Cholecalciferol 2000 UNITS CAPS Take by  mouth.      . citalopram (CELEXA) 10 MG tablet Take 10 mg by mouth daily.      Marland Kitchen. donepezil (ARICEPT) 10 MG tablet Take 10 mg by mouth at bedtime.      . furosemide (LASIX) 20 MG tablet Take 20 mg by mouth 2 (two) times daily.      Marland Kitchen. ipratropium-albuterol (DUONEB) 0.5-2.5 (3) MG/3ML SOLN Take 3 mLs by nebulization every 6 (six) hours as needed. For shortness of breath      . levothyroxine (SYNTHROID, LEVOTHROID) 50 MCG tablet Take 50 mcg by mouth daily before breakfast.      . losartan (COZAAR) 25 MG tablet Take 25 mg by mouth daily.      Marland Kitchen. omeprazole (PRILOSEC) 20 MG capsule Take 20 mg by mouth daily.      Ethelda Chick. Oyster Shell (OYSTER CALCIUM) 500 MG TABS Take 500 mg of elemental calcium by mouth 3 (three) times daily.      Marland Kitchen. senna (SENOKOT) 8.6 MG TABS Take 2 tablets by mouth at bedtime.      Marland Kitchen. tiotropium (SPIRIVA) 18 MCG inhalation capsule Place 18 mcg into inhaler and inhale daily.      . traMADol (ULTRAM) 50 MG tablet Take 1/2 tablet by mouth twice daily for OA pain; Take 1/2 tablet by mouth twice daily as needed for pain  60 tablet  5  . warfarin (JANTOVEN) 3 MG tablet Take 2.5 mg by mouth daily.  No facility-administered medications prior to visit.    PAST MEDICAL HISTORY: Past Medical History  Diagnosis Date  . Hypertension   . Hyperlipidemia   . Hypothyroidism   . Atrial fibrillation   . Pacemaker   . Shortness of breath   . CHF (congestive heart failure) 05/27/2006; 05/30/2006; 11/06/2008  . Vascular dementia     "due to CVA 06/08/2009"  . Stroke 03/06/2001; 05/30/2009    "light stroke"  . Stroke 06/08/2009    "probable small TIA"  . Stroke 01/06/2002  . Angina 12/22/2003  . Ventilator dependent 06/08/2009    respiratory failure  . H/O: rheumatic fever     "childhood"  . Coronary artery disease   . Left atrial enlargement   . H/O cardiomegaly     diastolic  . Pulmonary hypertension   . Anemia     "iron transfusions"  . Blood transfusion   . History of lower GI bleeding       "multiple; required blood transfusions"  . COPD (chronic obstructive pulmonary disease)   . GERD (gastroesophageal reflux disease)   . H/O hiatal hernia   . H/O chronic bronchitis   . Asthma   . Arthritis   . Anxiety   . Depression   . Dysphagia as late effect of stroke   . Autoimmune hepatitis 07/12/1999  . Bilateral renal cysts     "2 large on right off the lower pole"  . Chronic renal insufficiency   . Pleural effusion     "moderate right & small left"  . Bacterial pneumonia 11/06/2008; 10/23/2009; 03/30/2010  . Avascular necrosis of femoral head     right  . A-fib 01/04/2013    PAST SURGICAL HISTORY: Past Surgical History  Procedure Laterality Date  . Insert / replace / remove pacemaker  09/11/2000  . Av node ablation  "06/06/2005"  . Percutaneous tracheostomy  06/15/2009  . Electroencephalogram  01/28/2002; 11/26/2005; 10/23/2009  . Cardiac catheterization  06/14/1999; 12/26/2003; 05/30/2009  . Transcranial doppler & carotid ultrasound  11/28/2005  . Cardiac valve replacement  08/20/1999    "mitral valve replacement; St. Jude"  . Cardiac valve replacement  06/05/2009     "redo sternotomy & aortic valve replacement; tissue valve"  . Cauterized cecal av malformation    . Abdominal hysterectomy  1960's  . Dental implants removed  03/04/2001    lower  . Knee arthroscopy  12/26/2000    left  . Knee arthroscopy  07/29/2001    right    FAMILY HISTORY: History reviewed. No pertinent family history.  SOCIAL HISTORY: History   Social History  . Marital Status: Widowed    Spouse Name: N/A    Number of Children: N/A  . Years of Education: N/A   Occupational History  . Not on file.   Social History Main Topics  . Smoking status: Former Smoker -- 1.00 packs/day for 30 years    Types: Cigarettes    Quit date: 10/14/1970  . Smokeless tobacco: Never Used  . Alcohol Use: No  . Drug Use: No  . Sexual Activity: No   Other Topics Concern  . Not on file   Social History  Narrative  . No narrative on file     PHYSICAL EXAM  Filed Vitals:   02/08/14 0821  BP: 161/104  Pulse: 73   Cannot calculate BMI with a height equal to zero.  Generalized: Well developed, in no acute distress  Head: normocephalic and atraumatic,. Oropharynx benign  Musculoskeletal: No deformity  Neurological examination   Mentation: Alert not oriented to time, place,  Follows 1 step commands,little speech . Unable to perform MMSE Cranial nerve II-XII: Pupils irregular but  reactive to light extraocular movements were full, visual field were full on confrontational test. Left facial droop Hearing was intact to finger rubbing bilaterally. Uvula tongue midline. head turning and shoulder shrug were normal and symmetric.Tongue protrusion into cheek strength was normal. Motor: Moderate left-sided weakness in the upper and lower extremity increased tone on the left side with spasticity, tolerates passive movements, good strength on the right. Sensory: Withdraws to pain  Coordination: Unable to perform Reflexes: 2+ and symmetric on the right, brisker on the left,  plantar responses were flexor bilaterally. Gait and Station: Nonambulatory    DIAGNOSTIC DATA (LABS, IMAGING, TESTING) - I reviewed patient records, labs, notes, testing and imaging myself where available.  Lab Results  Component Value Date   WBC 5.5 05/28/2013   HGB 11.5* 05/28/2013   HCT 35.2* 05/28/2013   MCV 92.6 05/28/2013   PLT 173 05/28/2013      Component Value Date/Time   NA 141 05/21/2013 1100   K 3.9 05/21/2013 1100   CL 102 05/21/2013 1100   CO2 32 05/21/2013 1100   GLUCOSE 87 05/21/2013 1100   BUN 28* 05/21/2013 1100   CREATININE 1.37* 05/21/2013 1100   CALCIUM 10.3 05/21/2013 1100   PROT 8.8* 06/09/2012 0558   ALBUMIN 3.8 06/09/2012 0558   AST 29 06/09/2012 0558   ALT 10 06/09/2012 0558   ALKPHOS 87 06/09/2012 0558   BILITOT 1.0 06/09/2012 0558   GFRNONAA 34* 05/21/2013 1100   GFRAA 39* 05/21/2013 1100     ASSESSMENT  AND PLAN  80 y.o. year old female  has a past medical history of Hypertension; Hyperlipidemia; Atrial fibrillation; Pacemaker; Shortness of breath; CHF (congestive heart failure) (05/27/2006; 05/30/2006; 11/06/2008); Vascular dementia; Stroke (03/06/2001; 05/30/2009); Stroke (06/08/2009);  Angina (12/22/2003);  H/O: rheumatic fever; Coronary artery disease; Left atrial enlargement; H/O cardiomegaly; Pulmonary hypertension; Anemia; History of lower GI bleeding; COPD (chronic obstructive pulmonary disease); GERD (gastroesophageal reflux disease); H/O hiatal hernia; H/O chronic bronchitis; Asthma; Arthritis; Anxiety; Depression; Dysphagia as late effect of stroke; Autoimmune hepatitis (07/12/1999); Bilateral renal cysts; Chronic renal insufficiency; Pleural effusion;  Avascular necrosis of femoral head; and A-fib (01/04/2013). here to followup for vascular dementia.  She will continue her Aricept at the current dose.  She will continue warfarin for secondary stroke prevention She will followup yearly when necessary Nilda RiggsNancy Carolyn Wren [name], [facility]NP, [facility], APRN  [facility] [address], [address] [place], [place] [address] [phone] w

## 2014-02-28 ENCOUNTER — Non-Acute Institutional Stay (SKILLED_NURSING_FACILITY): Payer: PRIVATE HEALTH INSURANCE | Admitting: Internal Medicine

## 2014-02-28 DIAGNOSIS — J9610 Chronic respiratory failure, unspecified whether with hypoxia or hypercapnia: Secondary | ICD-10-CM

## 2014-02-28 DIAGNOSIS — I4891 Unspecified atrial fibrillation: Secondary | ICD-10-CM | POA: Insufficient documentation

## 2014-02-28 DIAGNOSIS — J449 Chronic obstructive pulmonary disease, unspecified: Secondary | ICD-10-CM

## 2014-02-28 DIAGNOSIS — M159 Polyosteoarthritis, unspecified: Secondary | ICD-10-CM

## 2014-02-28 DIAGNOSIS — J4489 Other specified chronic obstructive pulmonary disease: Secondary | ICD-10-CM | POA: Insufficient documentation

## 2014-02-28 NOTE — Progress Notes (Signed)
Patient ID: Geoffery LyonsMary L Dakin, female   DOB: 1922/11/29, 80 y.o.   MRN: [id number]    [facility]- [facility]  Code status-full code  CC- routine visit  Allergies reviewed  HPI 80 y/o female patient is a long term care resident of the facility. She was seen in her room today and denies any complaints. No new concern from staff. No skin concerns. No falls reported. Continues to be on oxygen. Weight has been stable at 130 lb  Review of Systems   Constitutional: Negative for fever, chills and diaphoresis.   HENT: Negative for congestion.    Eyes: Negative for blurred vision.   Respiratory: Negative for cough, shortness of breath and wheezing.    Cardiovascular: Negative for chest pain and palpitations.   Gastrointestinal: Negative for heartburn, nausea and vomiting.   Genitourinary:        Urinary incontinence   Musculoskeletal: Negative for falls. Left sided weakness. On a wheelchair Skin: Negative for rash.   Neurological: Negative for dizziness, seizures, loss of consciousness, weakness and headaches.    Reviewed pmh  Medication reviewed. [facility]  Physical exam BP 120/72  Pulse 60  Temp(Src) 99 F (37.2 C)  Resp 19  SpO2 96%  constitutional:  Thin, in NAD Neck: Neck supple. No tracheal deviation present. No thyromegaly present.   Cardiovascular: Normal rate, regular rhythm and intact distal pulses.  pacemaker in right upper chest. Scar from previous sternotomy Respiratory: Effort normal and breath sounds normal. No respiratory distress. She has no wheezes. On o2 by [facility]: Soft. Bowel sounds are normal. She exhibits no distension. There is no tenderness.  Musculoskeletal: She exhibits no edema. 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 Mild left hand contracture Neurological: She is alert and oriented to person   Skin: Skin is warm and dry.   Labs reviewed: 01-14-13: wbc 4.9; hgb 10.4; hvct 31.4 ;mcv 90.2 ;plt 190; glucose 65; bun 22; creat 1.12; k+4.1;  na++143 Liver normal 3.7; tsh 0.656  06-15-13 wbc 5.2, hb 11, plt 211, na 139, k 4.1, bun 23, cr 1.3, glu 82, ca 10.2  07-19-13 wbc 4.9, hb 10.5, hct 33.5, plt 201, na 140, k 3.8, glu 88, bun 20, cr 1.1, lft wnl, tsh 0.5610  09-07-13 iron 42, tibc 263, ferritin 167, wbc 6, hb 10, hct 33.7, plt 207  01-17-14 wbc 6.2, hb 9.8, hct 33.2, plt 211, na 134, k 4.1, bun 18, cr 1.1, glu 78, ca 9.3, ast 24, alp 79, alt 11, tsh 2.33  Assessment/Plan  Copd with chronic respiratory failure Continue spiriva and duoneb with her continuous oxygen. Breathing has been stable  Generalized OA Continue oscal. Also continue ultram 25 mg bid prn with scheduled tylenol  afib has a pacemaker and rate is currently controlled. Continue coumadin and monitor inr, goal inr 2-3. Continue statin  Oneal GroutMAHIMA Beni Turrell, MD  [facility] [phone] (Monday-Friday 8 am - 5 pm) [phone] (afterhours)

## 2014-03-09 ENCOUNTER — Telehealth: Payer: Self-pay | Admitting: Cardiology

## 2014-03-09 ENCOUNTER — Encounter: Payer: PRIVATE HEALTH INSURANCE | Admitting: *Deleted

## 2014-03-09 NOTE — Telephone Encounter (Signed)
LMOVM reminding pt to send remote transmission.   

## 2014-03-11 ENCOUNTER — Encounter: Payer: Self-pay | Admitting: Cardiology

## 2014-03-21 ENCOUNTER — Non-Acute Institutional Stay (SKILLED_NURSING_FACILITY): Payer: PRIVATE HEALTH INSURANCE | Admitting: Internal Medicine

## 2014-03-21 DIAGNOSIS — I4891 Unspecified atrial fibrillation: Secondary | ICD-10-CM

## 2014-03-21 DIAGNOSIS — F0280 Dementia in other diseases classified elsewhere without behavioral disturbance: Secondary | ICD-10-CM

## 2014-03-21 DIAGNOSIS — F329 Major depressive disorder, single episode, unspecified: Secondary | ICD-10-CM

## 2014-03-21 DIAGNOSIS — F0393 Unspecified dementia, unspecified severity, with mood disturbance: Secondary | ICD-10-CM

## 2014-03-21 DIAGNOSIS — I10 Essential (primary) hypertension: Secondary | ICD-10-CM

## 2014-03-21 DIAGNOSIS — E039 Hypothyroidism, unspecified: Secondary | ICD-10-CM

## 2014-03-21 DIAGNOSIS — F3289 Other specified depressive episodes: Secondary | ICD-10-CM

## 2014-03-21 NOTE — Progress Notes (Signed)
Patient ID: Savannah LyonsMary L [name], female   DOB: [birth year], 80 y.o.   MRN: [id number]    [facility]  Code status-full code  CC- routine visit  Allergies reviewed  HPI 80 y/o female patient is a long term care resident of the facility seen today for routine visit. She complaints of headache this am. Denies watery eyes. No nausea or vomiting. No skin concerns. No falls reported. Continues to be on coumadin  Review of Systems   Difficult to obtain ROS. As per staff no fever or chills. No acute behavior changes. Complaint with her medications. Mostly in bed these days. Less interactive with me today  Past Medical History  Diagnosis Date  . Hypertension   . Hyperlipidemia   . Hypothyroidism   . Atrial fibrillation   . Pacemaker   . Shortness of breath   . CHF (congestive heart failure) 05/27/2006; 05/30/2006; 11/06/2008  . Vascular dementia     "due to CVA 06/08/2009"  . Stroke 03/06/2001; 05/30/2009    "light stroke"  . Stroke 06/08/2009    "probable small TIA"  . Stroke 01/06/2002  . Angina 12/22/2003  . Ventilator dependent 06/08/2009    respiratory failure  . H/O: rheumatic fever     "childhood"  . Coronary artery disease   . Left atrial enlargement   . H/O cardiomegaly     diastolic  . Pulmonary hypertension   . Anemia     "iron transfusions"  . Blood transfusion   . History of lower GI bleeding     "multiple; required blood transfusions"  . COPD (chronic obstructive pulmonary disease)   . GERD (gastroesophageal reflux disease)   . H/O hiatal hernia   . H/O chronic bronchitis   . Asthma   . Arthritis   . Anxiety   . Depression   . Dysphagia as late effect of stroke   . Autoimmune hepatitis 07/12/1999  . Bilateral renal cysts     "2 large on right off the lower pole"  . Chronic renal insufficiency   . Pleural effusion     "moderate right & small left"  . Bacterial pneumonia 11/06/2008; 10/23/2009; 03/30/2010  . Avascular necrosis of femoral head     right    . A-fib 01/04/2013    Medication reviewed. See [facility]  Physical exam BP 124/69  Pulse 70  Temp(Src) 97 F (36.1 C)  Resp 18  SpO2 95%  constitutional: Thin bulit, in NAD Neck: Neck supple. No tracheal deviation present. No thyromegaly present.   Cardiovascular: Normal rate, regular rhythm and intact distal pulses.  pacemaker in right upper chest. Scar from previous sternotomy Respiratory: Effort normal and breath sounds normal. No respiratory distress. She has no wheezes.   GI: Soft. Bowel sounds are normal. She exhibits no distension. There is no tenderness.  Musculoskeletal: She exhibits no edema. 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 Mild left hand contracture Neurological: She is alert and oriented to person   Skin: Skin is warm and dry.  Psych: flat affect  Labs reviewed: 01-14-13: wbc 4.9; hgb 10.4; hvct 31.4 ;mcv 90.2 ;plt 190; glucose 65; bun 22; creat 1.12; k+4.1; na++143 Liver normal 3.7; tsh 0.656 01/17/14 na 134, k 4.1, bun 18, cr 1.1, lft wnl, tsh 2.33, wbc 6.2, hb 9.8, hct 33.2, plt 211  Assessment/plan   06-15-13 wbc 5.2, hb 11, plt 211, na 139, k 4.1, bun 23, cr 1.3, glu 82, ca 10.2  07-19-13 wbc 4.9,  hb 10.5, hct 33.5, plt 201, na 140, k 3.8, glu 88, bun 20, cr 1.1, lft wnl, tsh 0.5610  09-07-13 iron 42, tibc 263, ferritin 167, wbc 6, hb 10, hct 33.7, plt 207  Assessment/Plan  Depression- has flat affect. Will increase celexa to 20 mg daily for now and reassess  Hypothyroidism- reviewed tsh. Normal tsh with normal free t3 and t4 Continue levothyroxine 50 mcg daily. Monitor clinically and repeat ths in 3 months  afib- has a pacemaker and rate is currently controlled. Continue coumadin and monitor inr, goal inr 2-3 and currently on 3 mg daily. Follows with cardiology. Continue statin  Essential HTN- bp is well controlled. On losartan 25 mg daily and lasix 20 mg bid for now, monitor bmp. Continue aspirin   Oneal Grout, MD  [facility] [phone] (Monday-Friday 8 am - 5 pm) [phone] (afterhours)

## 2014-03-23 ENCOUNTER — Ambulatory Visit (INDEPENDENT_AMBULATORY_CARE_PROVIDER_SITE_OTHER): Payer: PRIVATE HEALTH INSURANCE | Admitting: *Deleted

## 2014-03-23 DIAGNOSIS — I4891 Unspecified atrial fibrillation: Secondary | ICD-10-CM

## 2014-03-23 LAB — MDC_IDC_ENUM_SESS_TYPE_REMOTE
Brady Statistic RV Percent Paced: 92 %
Implantable Pulse Generator Model: 2240
Implantable Pulse Generator Serial Number: 7529661
Lead Channel Pacing Threshold Amplitude: 0.625 V
Lead Channel Sensing Intrinsic Amplitude: 12 mV
MDC IDC MSMT LEADCHNL RV IMPEDANCE VALUE: 630 Ohm
MDC IDC MSMT LEADCHNL RV PACING THRESHOLD PULSEWIDTH: 0.4 ms
MDC IDC SET LEADCHNL RV PACING AMPLITUDE: 0.875
MDC IDC SET LEADCHNL RV PACING PULSEWIDTH: 0.4 ms
MDC IDC SET LEADCHNL RV SENSING SENSITIVITY: 2 mV

## 2014-03-23 NOTE — Progress Notes (Signed)
Remote pacemaker transmission.   

## 2014-03-29 ENCOUNTER — Telehealth: Payer: Self-pay | Admitting: *Deleted

## 2014-03-29 NOTE — Telephone Encounter (Signed)
Left message for daughter, transmission received.

## 2014-04-26 LAB — BASIC METABOLIC PANEL
BUN: 27 mg/dL — AB (ref 4–21)
GLUCOSE: 82 mg/dL
Potassium: 3.9 mmol/L (ref 3.4–5.3)
SODIUM: 138 mmol/L (ref 137–147)

## 2014-04-26 LAB — CBC AND DIFFERENTIAL
HEMATOCRIT: 36 % (ref 36–46)
HEMOGLOBIN: 10.7 g/dL — AB (ref 12.0–16.0)
PLATELETS: 187 10*3/uL (ref 150–399)
WBC: 5.6 10*3/mL

## 2014-04-27 ENCOUNTER — Encounter: Payer: Self-pay | Admitting: Cardiology

## 2014-05-01 DIAGNOSIS — N039 Chronic nephritic syndrome with unspecified morphologic changes: Secondary | ICD-10-CM

## 2014-05-01 DIAGNOSIS — I509 Heart failure, unspecified: Secondary | ICD-10-CM | POA: Insufficient documentation

## 2014-05-01 DIAGNOSIS — I130 Hypertensive heart and chronic kidney disease with heart failure and stage 1 through stage 4 chronic kidney disease, or unspecified chronic kidney disease: Secondary | ICD-10-CM | POA: Insufficient documentation

## 2014-05-01 DIAGNOSIS — N183 Chronic kidney disease, stage 3 unspecified: Secondary | ICD-10-CM | POA: Insufficient documentation

## 2014-05-02 ENCOUNTER — Non-Acute Institutional Stay (SKILLED_NURSING_FACILITY): Payer: PRIVATE HEALTH INSURANCE | Admitting: Internal Medicine

## 2014-05-02 ENCOUNTER — Encounter: Payer: Self-pay | Admitting: Internal Medicine

## 2014-05-02 DIAGNOSIS — G8190 Hemiplegia, unspecified affecting unspecified side: Secondary | ICD-10-CM | POA: Insufficient documentation

## 2014-05-02 DIAGNOSIS — E890 Postprocedural hypothyroidism: Secondary | ICD-10-CM

## 2014-05-02 DIAGNOSIS — F0150 Vascular dementia without behavioral disturbance: Secondary | ICD-10-CM | POA: Insufficient documentation

## 2014-05-02 DIAGNOSIS — E440 Moderate protein-calorie malnutrition: Secondary | ICD-10-CM

## 2014-05-02 NOTE — Progress Notes (Signed)
Patient ID: [name], female   DOB: May 03, 1923, 80 y.o.   MRN: [id number]  Location:  [facility] Provider: .Oneal GroutMahima Maverick Dieudonne, MD  Code Status:  FULL  Chief Complaint  Patient presents with  . Medical Management of Chronic Issues    HPI:  80 y/o female patient is a long term care resident of the facility. She was seen in her room today and denies any complaints. No new concern from staff. No skin concerns. No falls reported.   Review of Systems   Constitutional: Negative for fever, chills and diaphoresis.   HENT: Negative for congestion.    Eyes: Negative for blurred vision.   Respiratory: Negative for cough, shortness of breath and wheezing.    Cardiovascular: Negative for chest pain and palpitations.   Gastrointestinal: Negative for heartburn, nausea and vomiting.    Medications: Patient's Medications  New Prescriptions   No medications on file  Previous Medications   ACETAMINOPHEN (TYLENOL) 325 MG TABLET    Take 650 mg by mouth 2 (two) times daily.   ARTIFICIAL TEARS (LACRILUBE) OINT OPHTHALMIC OINTMENT    Place 1 application into both eyes at bedtime.   ASPIRIN 81 MG TABLET    Take 81 mg by mouth daily.   ATORVASTATIN (LIPITOR) 10 MG TABLET    Take 10 mg by mouth daily.   CHOLECALCIFEROL 2000 UNITS CAPS    Take by mouth.   CITALOPRAM (CELEXA) 10 MG TABLET    Take 20 mg by mouth daily.    DONEPEZIL (ARICEPT) 10 MG TABLET    Take 10 mg by mouth at bedtime.   FUROSEMIDE (LASIX) 20 MG TABLET    Take 20 mg by mouth 2 (two) times daily.   IPRATROPIUM-ALBUTEROL (DUONEB) 0.5-2.5 (3) MG/3ML SOLN    Take 3 mLs by nebulization every 6 (six) hours as needed. For shortness of breath   LEVOTHYROXINE (SYNTHROID, LEVOTHROID) 50 MCG TABLET    Take 50 mcg by mouth daily before breakfast.   LOSARTAN (COZAAR) 25 MG TABLET    Take 25 mg by mouth daily.   OMEPRAZOLE (PRILOSEC) 20 MG CAPSULE    Take 20 mg by mouth daily.   OYSTER SHELL (OYSTER CALCIUM) 500 MG TABS    Take 500 mg  of elemental calcium by mouth 3 (three) times daily.   SENNA (SENOKOT) 8.6 MG TABS    Take 2 tablets by mouth at bedtime.   TIOTROPIUM (SPIRIVA) 18 MCG INHALATION CAPSULE    Place 18 mcg into inhaler and inhale daily.   TRAMADOL (ULTRAM) 50 MG TABLET    Take 1/2 tablet by mouth twice daily for OA pain; Take 1/2 tablet by mouth twice daily as needed for pain   WARFARIN (JANTOVEN) 3 MG TABLET    Take 2.5 mg by mouth daily.   Modified Medications   No medications on file  Discontinued Medications   No medications on file    Physical Exam: Filed Vitals:   05/02/14 1332  BP: 122/84  Pulse: 78  Temp: 98.7 F (37.1 C)  Resp: 20  Height: 5' (1.524 m)  Weight: 128 lb 6.4 oz (58.242 kg)  SpO2: 99%   constitutional: Thin, in NAD Neck: Neck supple. No tracheal deviation present. No thyromegaly present.   Cardiovascular: Normal rate, regular rhythm and intact distal pulses.  pacemaker in right upper chest. Scar from previous sternotomy Respiratory: Effort normal and breath sounds normal. No respiratory distress. She has no wheezes.   GI: Soft. Bowel sounds  are normal. She exhibits no distension. There is no tenderness.  Musculoskeletal: She exhibits no edema. 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 Mild left hand contracture Neurological: She is alert  Skin: Skin is warm and dry.   Labs reviewed: 01-14-13: wbc 4.9; hgb 10.4; hvct 31.4 ;mcv 90.2 ;plt 190; glucose 65; bun 22; creat 1.12; k+4.1; na++143 Liver normal 3.7; tsh 0.656  06-15-13 wbc 5.2, hb 11, plt 211, na 139, k 4.1, bun 23, cr 1.3, glu 82, ca 10.2  07-19-13 wbc 4.9, hb 10.5, hct 33.5, plt 201, na 140, k 3.8, glu 88, bun 20, cr 1.1, lft wnl, tsh 0.5610  09-07-13 iron 42, tibc 263, ferritin 167, wbc 6, hb 10, hct 33.7, plt 207  Basic Metabolic Panel:  Recent Labs  16/10/96 1100 04/26/14  NA 141 138  K 3.9 3.9  CL 102  --   CO2 32  --   GLUCOSE 87  --   BUN 28* 27*  CREATININE 1.37*  --   CALCIUM 10.3  --      Liver Function Tests:  Recent Labs  01/17/14  AST 24  ALT 11    CBC:  Recent Labs  05/26/13 0450 05/27/13 0530 05/28/13 0425 04/26/14  WBC 5.1 5.9 5.5 5.6  HGB 10.2* 11.6* 11.5* 10.7*  HCT 30.7* 35.2* 35.2* 36  MCV 91.9 92.6 92.6  --   PLT 169 193 173 187    Assessment/Plan Hypothyroidism- reviewed tsh. Continue levothyroxine 50 mcg daily.   Vascular dementia- continue aricept for now. Monitor skin, bowel movement and po intake  cva with hemiplegia- continue asa and coumadin for now. Fall precautions. Continue bp meds  Malnutrition- continue magic cup, monitor po intake, decline anticipated. Monitor weight  Oneal Grout, MD  [facility] [phone] (Monday-Friday 8 am - 5 pm) [phone] (afterhours)

## 2014-05-03 ENCOUNTER — Encounter: Payer: Self-pay | Admitting: Cardiovascular Disease

## 2014-05-19 NOTE — Telephone Encounter (Signed)
Noted  

## 2014-05-30 ENCOUNTER — Non-Acute Institutional Stay (SKILLED_NURSING_FACILITY): Payer: PRIVATE HEALTH INSURANCE | Admitting: Internal Medicine

## 2014-05-30 DIAGNOSIS — I739 Peripheral vascular disease, unspecified: Secondary | ICD-10-CM

## 2014-05-30 DIAGNOSIS — N183 Chronic kidney disease, stage 3 unspecified: Secondary | ICD-10-CM

## 2014-05-30 DIAGNOSIS — E785 Hyperlipidemia, unspecified: Secondary | ICD-10-CM

## 2014-05-30 DIAGNOSIS — I509 Heart failure, unspecified: Secondary | ICD-10-CM

## 2014-06-19 ENCOUNTER — Other Ambulatory Visit: Payer: Self-pay

## 2014-06-19 ENCOUNTER — Other Ambulatory Visit: Payer: Self-pay | Admitting: Internal Medicine

## 2014-06-19 LAB — CBC WITH DIFFERENTIAL/PLATELET
Basophil #: 0 10*3/uL (ref 0.0–0.1)
Basophil %: 0.6 %
EOS ABS: 0 10*3/uL (ref 0.0–0.7)
EOS PCT: 0.6 %
HCT: 34.1 % — AB (ref 35.0–47.0)
HGB: 10.9 g/dL — AB (ref 12.0–16.0)
Lymphocyte #: 0.6 10*3/uL — ABNORMAL LOW (ref 1.0–3.6)
Lymphocyte %: 8.6 %
MCH: 29.9 pg (ref 26.0–34.0)
MCHC: 31.9 g/dL — AB (ref 32.0–36.0)
MCV: 94 fL (ref 80–100)
MONO ABS: 0.4 x10 3/mm (ref 0.2–0.9)
Monocyte %: 5.8 %
NEUTROS ABS: 5.6 10*3/uL (ref 1.4–6.5)
Neutrophil %: 84.4 %
Platelet: 184 10*3/uL (ref 150–440)
RBC: 3.64 10*6/uL — ABNORMAL LOW (ref 3.80–5.20)
RDW: 14.7 % — AB (ref 11.5–14.5)
WBC: 6.6 10*3/uL (ref 3.6–11.0)

## 2014-06-19 LAB — BASIC METABOLIC PANEL
Anion Gap: 5 — ABNORMAL LOW (ref 7–16)
BUN: 14 mg/dL (ref 7–18)
CHLORIDE: 102 mmol/L (ref 98–107)
CREATININE: 1.31 mg/dL — AB (ref 0.60–1.30)
Calcium, Total: 9.6 mg/dL (ref 8.5–10.1)
Co2: 31 mmol/L (ref 21–32)
GFR CALC AF AMER: 42 — AB
GFR CALC NON AF AMER: 36 — AB
Glucose: 101 mg/dL — ABNORMAL HIGH (ref 65–99)
OSMOLALITY: 276 (ref 275–301)
POTASSIUM: 4.1 mmol/L (ref 3.5–5.1)
Sodium: 138 mmol/L (ref 136–145)

## 2014-06-19 LAB — URINALYSIS, COMPLETE
Bilirubin,UR: NEGATIVE
Glucose,UR: NEGATIVE mg/dL (ref 0–75)
Hyaline Cast: 1
Ketone: NEGATIVE
NITRITE: NEGATIVE
Ph: 5 (ref 4.5–8.0)
Specific Gravity: 1.021 (ref 1.003–1.030)
WBC UR: 5 /HPF (ref 0–5)

## 2014-06-21 ENCOUNTER — Other Ambulatory Visit: Payer: Self-pay | Admitting: *Deleted

## 2014-06-21 ENCOUNTER — Ambulatory Visit (INDEPENDENT_AMBULATORY_CARE_PROVIDER_SITE_OTHER): Payer: PRIVATE HEALTH INSURANCE | Admitting: Cardiovascular Disease

## 2014-06-21 ENCOUNTER — Encounter: Payer: Self-pay | Admitting: Cardiovascular Disease

## 2014-06-21 VITALS — BP 149/96 | HR 76

## 2014-06-21 DIAGNOSIS — I480 Paroxysmal atrial fibrillation: Secondary | ICD-10-CM

## 2014-06-21 DIAGNOSIS — N183 Chronic kidney disease, stage 3 unspecified: Secondary | ICD-10-CM

## 2014-06-21 DIAGNOSIS — I6350 Cerebral infarction due to unspecified occlusion or stenosis of unspecified cerebral artery: Secondary | ICD-10-CM

## 2014-06-21 DIAGNOSIS — Z7901 Long term (current) use of anticoagulants: Secondary | ICD-10-CM

## 2014-06-21 DIAGNOSIS — G40909 Epilepsy, unspecified, not intractable, without status epilepticus: Secondary | ICD-10-CM

## 2014-06-21 DIAGNOSIS — I4891 Unspecified atrial fibrillation: Secondary | ICD-10-CM

## 2014-06-21 DIAGNOSIS — I639 Cerebral infarction, unspecified: Secondary | ICD-10-CM

## 2014-06-21 DIAGNOSIS — F0150 Vascular dementia without behavioral disturbance: Secondary | ICD-10-CM

## 2014-06-21 LAB — URINE CULTURE

## 2014-06-21 MED ORDER — LORAZEPAM 2 MG/ML IJ SOLN: INTRAMUSCULAR | Status: DC

## 2014-06-21 NOTE — Patient Instructions (Signed)
Your physician recommends that you schedule a follow-up appointment in: 3 mo with Dr. Royann Shivers. 6 mo with Dr. Tresa Endo. No changes were made today in your therapy.

## 2014-06-21 NOTE — Telephone Encounter (Signed)
[facility] 

## 2014-06-21 NOTE — Progress Notes (Signed)
Patient ID: [name], female   DOB: 1923/01/31, 80 y.o.   MRN: [id number]     HPI: [name] is a 80 y.o. female who presented to the office for an 8 month cardiology follow up evaluation.  [name] has remote history of rheumatic fever during childhood. In 2001 she underwent St. Jude's mitral valve replacement and also at that time underwent permanent transvenous pacemaker for symptomatically bradycardic., She has undergone AV node ablation for atrial fibrillation with associated left atrial flutter and difficult to control ventricular response. Because of progressive aortic valve stenosis she underwent bioprosthetic valve replacement in August 2010. She initially tolerated surgery well, but unfortunately postoperatively developed a significant stroke. Subsequently, she has been in a nursing home and more recently at [facility].  In August 2014, she underwent permanent pacemaker generator change when the patient was found to be end-of-life.  This was done by Dr. Royann Shivers.  [name] is  wheelchair bound and  never fully recovered from her significant right temporal infarct .  Following her aortic valve replacement surgery.. She does have some dementia as well some chronic depression. She  reestablish care with me in January 2015 after Dr. Kandis Cocking retirement.  Savannah. [name] has a history of seizure disorder.  Apparently, last weekend, she had 2 short-lived seizures.  Apparently, she was started on Keppra 250 mg 2 times daily.  She denies chest pain.  She is unaware of palpitations.  She denies presyncope or syncope.  She is here today with her daughter, who remains very active in her care and also works in urgent care with Dr. Cleta Alberts.  Savannah. Bobak is not seen.  Dr. see, since the implant and saw Dr. Alanda [name] in late September 2015 after the implant upgrade.  She tells me she is scheduled to undergo remote device checked on 06/27/2014.  Past Medical History  Diagnosis Date  . Hypertension     . Hyperlipidemia   . Hypothyroidism   . Atrial fibrillation   . Pacemaker   . Shortness of breath   . CHF (congestive heart failure) 05/27/2006; 05/30/2006; 11/06/2008  . Vascular dementia     "due to CVA 06/08/2009"  . Stroke 03/06/2001; 05/30/2009    "light stroke"  . Stroke 06/08/2009    "probable small TIA"  . Stroke 01/06/2002  . Angina 12/22/2003  . Ventilator dependent 06/08/2009    respiratory failure  . H/O: rheumatic fever     "childhood"  . Coronary artery disease   . Left atrial enlargement   . H/O cardiomegaly     diastolic  . Pulmonary hypertension   . Anemia     "iron transfusions"  . Blood transfusion   . History of lower GI bleeding     "multiple; required blood transfusions"  . COPD (chronic obstructive pulmonary disease)   . GERD (gastroesophageal reflux disease)   . H/O hiatal hernia   . H/O chronic bronchitis   . Asthma   . Arthritis   . Anxiety   . Depression   . Dysphagia as late effect of stroke   . Autoimmune hepatitis 07/12/1999  . Bilateral renal cysts     "2 large on right off the lower pole"  . Chronic renal insufficiency   . Pleural effusion     "moderate right & small left"  . Bacterial pneumonia 11/06/2008; 10/23/2009; 03/30/2010  . Avascular necrosis of femoral head     right  . A-fib 01/04/2013    Past Surgical History  Procedure Laterality Date  . Insert / replace / remove pacemaker  09/11/2000  . Av node ablation  "06/06/2005"  . Percutaneous tracheostomy  06/15/2009  . Electroencephalogram  01/28/2002; 11/26/2005; 10/23/2009  . Cardiac catheterization  06/14/1999; 12/26/2003; 05/30/2009  . Transcranial doppler & carotid ultrasound  11/28/2005  . Cardiac valve replacement  08/20/1999    "mitral valve replacement; St. Jude"  . Cardiac valve replacement  06/05/2009     "redo sternotomy & aortic valve replacement; tissue valve"  . Cauterized cecal av malformation    . Abdominal hysterectomy  1960's  . Dental implants removed  03/04/2001    lower   . Knee arthroscopy  12/26/2000    left  . Knee arthroscopy  07/29/2001    right    Allergies  Allergen Reactions  . Advil [Ibuprofen] Other (See Comments)    "think it just doesn't work for her"  . Alprazolam Other (See Comments)    "don't remember what happens"  . Relafen [Nabumetone] Other (See Comments)    "don't remember what happens"  . Sulfa Antibiotics Other (See Comments)    "don't remember what happens"  . Toprol Xl [Metoprolol Succinate] Other (See Comments)    ; "don't remember what happens"    Current Outpatient Prescriptions  Medication Sig Dispense Refill  . acetaminophen (TYLENOL) 325 MG tablet Take 650 mg by mouth 2 (two) times daily.      Marland Kitchen artificial tears (LACRILUBE) OINT ophthalmic ointment Place 1 application into both eyes at bedtime.      Marland Kitchen aspirin 81 MG tablet Take 81 mg by mouth daily.      Marland Kitchen atorvastatin (LIPITOR) 10 MG tablet Take 10 mg by mouth daily.      . Cholecalciferol 2000 UNITS CAPS Take by mouth.      . citalopram (CELEXA) 10 MG tablet Take 20 mg by mouth daily.       Marland Kitchen donepezil (ARICEPT) 10 MG tablet Take 10 mg by mouth at bedtime.      . furosemide (LASIX) 20 MG tablet Take 20 mg by mouth 2 (two) times daily.      Marland Kitchen ipratropium-albuterol (DUONEB) 0.5-2.5 (3) MG/3ML SOLN Take 3 mLs by nebulization every 6 (six) hours as needed. For shortness of breath      . levETIRAcetam (KEPPRA) 250 MG tablet Take 250 mg by mouth 2 (two) times daily.      Marland Kitchen levothyroxine (SYNTHROID, LEVOTHROID) 50 MCG tablet Take 50 mcg by mouth daily before breakfast.      . losartan (COZAAR) 25 MG tablet Take 25 mg by mouth daily.      Marland Kitchen omeprazole (PRILOSEC) 20 MG capsule Take 20 mg by mouth daily.      Ethelda Chick (OYSTER CALCIUM) 500 MG TABS Take 500 mg of elemental calcium by mouth 3 (three) times daily.      Marland Kitchen senna (SENOKOT) 8.6 MG TABS Take 2 tablets by mouth at bedtime.      Marland Kitchen tiotropium (SPIRIVA) 18 MCG inhalation capsule Place 18 mcg into inhaler and  inhale daily.      . traMADol (ULTRAM) 50 MG tablet Take 1/2 tablet by mouth twice daily for OA pain; Take 1/2 tablet by mouth twice daily as needed for pain  60 tablet  5  . warfarin (COUMADIN) 2.5 MG tablet Take 3.5 mg by mouth. Sun/Mon/Fri      . warfarin (COUMADIN) 4 MG tablet Take 4 mg by mouth. Tues/Wed/Thurs      . LORazepam (  ATIVAN) 2 MG/ML injection Inject 0.76ml intramuscularly twice daily for anxiety. Hold for sedation  30 mL  5   No current facility-administered medications for this visit.    History   Social History  . Marital Status: Widowed    Spouse Name: N/A    Number of Children: N/A  . Years of Education: N/A   Occupational History  . Not on file.   Social History Main Topics  . Smoking status: Former Smoker -- 1.00 packs/day for 30 years    Types: Cigarettes    Quit date: 10/14/1970  . Smokeless tobacco: Never Used  . Alcohol Use: No  . Drug Use: No  . Sexual Activity: No   Other Topics Concern  . Not on file   Social History Narrative  . No narrative on file    History reviewed. No pertinent family history.  ROS General: Negative; No fevers, chills, or night sweats; positive for chronic weakness HEENT: Negative; No changes in vision or hearing, sinus congestion, difficulty swallowing Pulmonary: Negative; No cough, wheezing, shortness of breath, hemoptysis Cardiovascular: Negative; No chest pain, presyncope, syncope, palpitations GI: Negative; No nausea, vomiting, diarrhea, or abdominal pain GU: Negative; No dysuria, hematuria, or difficulty voiding Musculoskeletal: Negative; no myalgias, joint pain, or weakness Hematologic/Oncology: Negative; no easy bruising, bleeding Endocrine: Positive for hypothyroidism no heat/cold intolerance; no diabetes Neuro: Positive for stroke with residual weakness; positive for seizures;  Skin: Negative; No rashes or skin lesions Psychiatric: Negative; No behavioral problems, depression Sleep: Negative; No snoring,  daytime sleepiness, hypersomnolence, bruxism, restless legs, hypnogognic hallucinations, no cataplexy Other comprehensive 14 point system review is negative.  PE BP 149/96  Pulse 76  General: Alert, oriented, no distress; appears frail with significant thoracic kyphosis. Skin: normal turgor, no rashes HEENT: Normocephalic, atraumatic. Pupils round and reactive; sclera anicteric;no lid lag. Extraocular muscles intact. No xanthelasmas Nose without nasal septal hypertrophy Mouth/Parynx benign; Mallinpatti scale 3 Neck: No JVD, no carotid bruits; normal carotid upstroke Lungs: clear to ausculatation and percussion; no wheezing or rales Chest wall: no tenderness to palpitation Heart: RRR, s1 s2 normal 2/6 systolic murmur with crisp valve clicks. No S3 gallop. No diastolic murmur of aortic insufficiency.  Abdomen: soft, nontender; no hepatosplenomehaly, BS+; abdominal aorta nontender and not dilated by palpation. Back: no CVA tenderness; significant thoracic kyphosis. Pulses 2+ Extremities: no clubbing cyanosis or edema, Homan's sign negative  Neurologic: Residual weakness. Psychologic: depressed affect  ECG (independently read by me): Ventricular paced rhythm at 76 beats per minute with occasional PVC  LABS:  BMET    Component Value Date/Time   NA 138 04/26/2014   NA 141 05/21/2013 1100   K 3.9 04/26/2014   CL 102 05/21/2013 1100   CO2 32 05/21/2013 1100   GLUCOSE 87 05/21/2013 1100   BUN 27* 04/26/2014   BUN 28* 05/21/2013 1100   CREATININE 1.37* 05/21/2013 1100   CALCIUM 10.3 05/21/2013 1100   GFRNONAA 34* 05/21/2013 1100   GFRAA 39* 05/21/2013 1100     Hepatic Function Panel     Component Value Date/Time   PROT 8.8* 06/09/2012 0558   ALBUMIN 3.8 06/09/2012 0558   AST 24 01/17/2014   ALT 11 01/17/2014   ALKPHOS 87 06/09/2012 0558   BILITOT 1.0 06/09/2012 0558     CBC    Component Value Date/Time   WBC 5.6 04/26/2014   WBC 5.5 05/28/2013 0425   WBC 4.1 12/28/2008 1146   RBC 3.80* 05/28/2013  0425   RBC 4.01 12/28/2008 1146  RBC 2.51* 07/27/2008 1745   HGB 10.7* 04/26/2014   HGB 10.8* 12/28/2008 1146   HCT 36 04/26/2014   HCT 32.9* 12/28/2008 1146   PLT 187 04/26/2014   PLT 201 12/28/2008 1146   MCV 92.6 05/28/2013 0425   MCV 82.1 12/28/2008 1146   MCH 30.3 05/28/2013 0425   MCH 26.9 12/28/2008 1146   MCHC 32.7 05/28/2013 0425   MCHC 32.8 12/28/2008 1146   RDW 13.3 05/28/2013 0425   RDW 20.7* 12/28/2008 1146   LYMPHSABS 0.7 06/08/2012 1853   LYMPHSABS 0.4* 12/28/2008 1146   MONOABS 0.3 06/08/2012 1853   MONOABS 0.4 12/28/2008 1146   EOSABS 0.1 06/08/2012 1853   EOSABS 0.1 12/28/2008 1146   BASOSABS 0.0 06/08/2012 1853   BASOSABS 0.0 12/28/2008 1146     BNP    Component Value Date/Time   PROBNP 143.0* 02/17/2010 1311    Lipid Panel     Component Value Date/Time   CHOL 200 02/05/2012 0550   TRIG 81 02/05/2012 0550   HDL 55 02/05/2012 0550   CHOLHDL 3.6 02/05/2012 0550   VLDL 16 02/05/2012 0550   LDLCALC 129* 02/05/2012 0550     RADIOLOGY: No results found.    ASSESSMENT AND PLAN:  Savannah. [name] is an 80 year old, African American female, who had been very active prior to her disability secondary to her extensive stroke.  She did suffer rheumatic fever during childhood. She is now 145years status post St. Jude mitral valve replacement and 5 years status post bioprosthetic aortic valve replacement for development of severe aortic valvular stenosis. She had her initial pacemaker implanted in 2001. In August 2014 her pacemaker was end-of-life and essentially failed. Fortunately she was hospitalized after Dr. Alanda [name] evaluated her in the office and she underwent explantation of her St. Jude Assurity generator which was implanted on 09/11/2000 and a new generator St. Jude integrity AFxDR was implanted by Dr. Royann Shivers.  She does have a seizure history and recently was started on Keppra for 2 apparent seizures which manifested themselves with right sided movement and transient aphasia  followed by a postictal spell with ultimate resolution.  Presently, she does not have signs of heart failure.  Her blood pressure initially was elevated at 149/96, but on repeat by me was improved to 130/88 , and she is tolerating, losartan 25 mg in addition to the furosemide for blood pressure control.  I reviewed laboratory that she had done in July 2015. She does not have significant edema on her current dose of furosemide 20 mg 2 times a day.  She is on atorvastatin 10 mg for hyperlipidemia.  She is on chronic Coumadin.    I have suggested that she have a face-to-face pacemaker evaluation with Dr. Royann Shivers in 3 months.   I will see her in 6 months for cardiology reevaluation.   Lennette Bihari, MD, [facility]  06/21/2014 6:01 PM

## 2014-06-27 ENCOUNTER — Encounter: Payer: Self-pay | Admitting: Cardiovascular Disease

## 2014-06-27 ENCOUNTER — Non-Acute Institutional Stay (SKILLED_NURSING_FACILITY): Payer: PRIVATE HEALTH INSURANCE | Admitting: Internal Medicine

## 2014-06-27 ENCOUNTER — Ambulatory Visit (INDEPENDENT_AMBULATORY_CARE_PROVIDER_SITE_OTHER): Payer: PRIVATE HEALTH INSURANCE | Admitting: *Deleted

## 2014-06-27 ENCOUNTER — Encounter: Payer: Self-pay | Admitting: Internal Medicine

## 2014-06-27 DIAGNOSIS — I509 Heart failure, unspecified: Secondary | ICD-10-CM

## 2014-06-27 DIAGNOSIS — F0280 Dementia in other diseases classified elsewhere without behavioral disturbance: Secondary | ICD-10-CM

## 2014-06-27 DIAGNOSIS — F0150 Vascular dementia without behavioral disturbance: Secondary | ICD-10-CM

## 2014-06-27 DIAGNOSIS — I480 Paroxysmal atrial fibrillation: Secondary | ICD-10-CM

## 2014-06-27 DIAGNOSIS — I4891 Unspecified atrial fibrillation: Secondary | ICD-10-CM

## 2014-06-27 DIAGNOSIS — G309 Alzheimer's disease, unspecified: Secondary | ICD-10-CM

## 2014-06-27 DIAGNOSIS — G40909 Epilepsy, unspecified, not intractable, without status epilepticus: Secondary | ICD-10-CM

## 2014-06-27 LAB — MDC_IDC_ENUM_SESS_TYPE_REMOTE
Battery Voltage: 3.01 V
Brady Statistic RV Percent Paced: 92 %
Lead Channel Impedance Value: 690 Ohm
Lead Channel Setting Pacing Amplitude: 0.875
Lead Channel Setting Pacing Pulse Width: 0.4 ms
MDC IDC MSMT BATTERY REMAINING LONGEVITY: 136 mo
MDC IDC MSMT BATTERY REMAINING PERCENTAGE: 95.5 %
MDC IDC MSMT LEADCHNL RV PACING THRESHOLD AMPLITUDE: 0.625 V
MDC IDC MSMT LEADCHNL RV PACING THRESHOLD PULSEWIDTH: 0.4 ms
MDC IDC MSMT LEADCHNL RV SENSING INTR AMPL: 12 mV
MDC IDC PG SERIAL: 7529661
MDC IDC SESS DTM: 20150914060010
MDC IDC SET LEADCHNL RV SENSING SENSITIVITY: 2 mV

## 2014-06-27 NOTE — Progress Notes (Signed)
This encounter was created in error - please disregard.

## 2014-06-27 NOTE — Progress Notes (Signed)
Patient ID: [name], female   DOB: September 18, 1923, 80 y.o.   MRN: [id number]    Facility: [facility]  Chief Complaint  Patient presents with  . Medical Management of Chronic Issues   Allergies reviewed  HPI 80 y/o female patient with HTN, CHF, CKD, HL and PVD is seen for RV. She is a long term care resident of the facility. She had new onset seizure last week and was started on keppra. Her family did not want workup/ neuro appointment. She has returned back to baseline. No further seizure. She denies any complaints. No new concern from staff. No skin concerns. No falls reported. Continues to be on coumadin.   Review of Systems   Constitutional: Negative for fever, chills and diaphoresis.   HENT: Negative for congestion.    Eyes: Negative for blurred vision.   Respiratory: Negative for cough, shortness of breath and wheezing.    Cardiovascular: Negative for chest pain and palpitations.   Gastrointestinal: Negative for heartburn, nausea and vomiting.   Genitourinary: Urinary incontinence. Denies dysuria Musculoskeletal: Negative for falls. Left sided weakness.  Skin: Negative for rash.   Neurological: Negative for dizziness and headaches.    Medication reviewed. See [facility]  Physical exam BP 140/73  Pulse 81  Temp(Src) 97.2 F (36.2 C)  Resp 18  SpO2 99%  constitutional: Thin, in no distress, comfortable Neck: Neck supple. No tracheal deviation present. No thyromegaly present.   Cardiovascular: Normal rate, regular rhythm and intact distal pulses.  pacemaker in right upper chest. Scar from previous sternotomy Respiratory: Effort normal and breath sounds normal. No respiratory distress. She has no wheezes. On o2   GI: Soft. Bowel sounds are normal. She exhibits no distension. There is no tenderness.  Musculoskeletal: She exhibits no edema. 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 Mild left hand contracture Neurological: She  is alert and can make her needs known, baseline mild confusion  Skin: Skin is warm and dry.   Labs reviewed: 07-19-13 wbc 4.9, hb 10.5, hct 33.5, plt 201, na 140, k 3.8, glu 88, bun 20, cr 1.1, lft wnl, tsh 0.5610 09-07-13 iron 42, tibc 263, ferritin 167, wbc 6, hb 10, hct 33.7, plt 207 04-26-14 wbc 5.6, hb 10.7, hct 35.5, plt 187, na 138, k 3.9, bun 20, cr 1.3, glu 82, ca 9.9 06-23-14 wbc 5, hb 10.7, hct 35.5, plt 201  Assessment/Plan  Seizure No new seizure. Continue keppra 250 mg bid, seizure precautions  Dementia with depression Continue aricept for now. Continue celexa for mood  chf Stable, continue cozaar 25 mg daily, lasix 20 mg twice daily. Monitor weight   Oneal Grout, MD  [facility] [phone] (Monday-Friday 8 am - 5 pm) [phone] (afterhours)

## 2014-06-27 NOTE — Progress Notes (Signed)
Remote pacemaker transmission.   

## 2014-06-29 ENCOUNTER — Encounter: Payer: Self-pay | Admitting: Cardiovascular Disease

## 2014-07-02 NOTE — Progress Notes (Signed)
Patient ID: [name], female   DOB: 07/12/1923, 80 y.o.   MRN: [id number]    [facility]  Chief Complaint  Patient presents with  . Medical Management of Chronic Issues   Allergies reviewed  HPI 80 y/o female patient with HTN, CHF, CKD, HL and PVD is seen for RV. She is a long term care resident of the facility. She denies any complaints. No new concern from staff. No skin concerns. No falls reported. Continues to be on coumadin. Weight is stable. She can make her needs known.   Review of Systems   Constitutional: Negative for fever, chills and diaphoresis.   HENT: Negative for congestion.    Eyes: Negative for blurred vision.   Respiratory: Negative for cough, shortness of breath and wheezing.    Cardiovascular: Negative for chest pain and palpitations.   Gastrointestinal: Negative for heartburn, nausea and vomiting.   Genitourinary: Urinary incontinence. Denies dysuria Musculoskeletal: Negative for falls. Left sided weakness.  Skin: Negative for rash.   Neurological: Negative for dizziness and headaches.    Medication reviewed. See [facility]  Physical exam BP 113/67  Pulse 76  Temp(Src) 97.6 F (36.4 C)  Resp 18  SpO2 96%  constitutional: Thin, in no distress, comfortable Neck: Neck supple. No tracheal deviation present. No thyromegaly present.   Cardiovascular: Normal rate, regular rhythm and intact distal pulses.  pacemaker in right upper chest. Scar from previous sternotomy Respiratory: Effort normal and breath sounds normal. No respiratory distress. She has no wheezes. On o2   GI: Soft. Bowel sounds are normal. She exhibits no distension. There is no tenderness.  Musculoskeletal: She exhibits no edema. 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 Mild left hand contracture Neurological: She is alert and can make her needs known, baseline mild confusion  Skin: Skin is warm and dry.   Labs reviewed: 07-19-13 wbc 4.9, hb 10.5, hct 33.5, plt  201, na 140, k 3.8, glu 88, bun 20, cr 1.1, lft wnl, tsh 0.5610 09-07-13 iron 42, tibc 263, ferritin 167, wbc 6, hb 10, hct 33.7, plt 207 04-26-14 wbc 5.6, hb 10.7, hct 35.5, plt 187, na 138, k 3.9, bun 20, cr 1.3, glu 82, ca 9.9  Assessment/Plan  Hyperlipidemia Continue lipitor 10 mg daily  PVD Stable, continue daily baby aspirin  chf Stable, continue cozaar 25 mg daily, lasix 20 mg daily  ckd stage 3 With HTN, Stable, monitor renal function with her on lasix. continue bp med   Oneal Grout, MD  [facility] [phone] (Monday-Friday 8 am - 5 pm) [phone] (afterhours)

## 2014-07-05 ENCOUNTER — Encounter: Payer: Self-pay | Admitting: Cardiology

## 2014-07-07 ENCOUNTER — Telehealth: Payer: Self-pay | Admitting: Cardiovascular Disease

## 2014-07-07 NOTE — Telephone Encounter (Signed)
Closed encounter °

## 2014-07-28 ENCOUNTER — Encounter: Payer: Self-pay | Admitting: Internal Medicine

## 2014-07-28 ENCOUNTER — Non-Acute Institutional Stay (SKILLED_NURSING_FACILITY): Payer: PRIVATE HEALTH INSURANCE | Admitting: Internal Medicine

## 2014-07-28 DIAGNOSIS — I482 Chronic atrial fibrillation, unspecified: Secondary | ICD-10-CM

## 2014-07-28 DIAGNOSIS — I509 Heart failure, unspecified: Secondary | ICD-10-CM

## 2014-07-28 DIAGNOSIS — E890 Postprocedural hypothyroidism: Secondary | ICD-10-CM

## 2014-07-28 DIAGNOSIS — I639 Cerebral infarction, unspecified: Secondary | ICD-10-CM

## 2014-07-28 DIAGNOSIS — M1990 Unspecified osteoarthritis, unspecified site: Secondary | ICD-10-CM

## 2014-07-28 DIAGNOSIS — N183 Chronic kidney disease, stage 3 unspecified: Secondary | ICD-10-CM

## 2014-07-28 DIAGNOSIS — K219 Gastro-esophageal reflux disease without esophagitis: Secondary | ICD-10-CM

## 2014-07-28 DIAGNOSIS — F0150 Vascular dementia without behavioral disturbance: Secondary | ICD-10-CM

## 2014-07-28 DIAGNOSIS — J449 Chronic obstructive pulmonary disease, unspecified: Secondary | ICD-10-CM

## 2014-07-28 DIAGNOSIS — I10 Essential (primary) hypertension: Secondary | ICD-10-CM

## 2014-07-28 DIAGNOSIS — H04123 Dry eye syndrome of bilateral lacrimal glands: Secondary | ICD-10-CM

## 2014-07-28 DIAGNOSIS — G40909 Epilepsy, unspecified, not intractable, without status epilepticus: Secondary | ICD-10-CM

## 2014-07-28 NOTE — Progress Notes (Signed)
Patient ID: Savannah LyonsMary L [name], female   DOB: 10-Sep-1923, 80 y.o.   MRN: [id number]    Facility: [facility]    Chief Complaint  Patient presents with  . Annual Exam   Allergies reviewed  HPI 80 y/o female patient with HTN, CHF, CKD, HL and PVD is seen for annual exam. She is a long term care resident of the facility. No further seizure reported. Her dementia is at baseline. She is in bed sleeping but awakens easily to name call. She denies any concerns.  No skin concerns. No falls reported. Continues to be on coumadin.   Review of Systems   Constitutional: Negative for fever, chills and diaphoresis.   HENT: Negative for congestion.    Eyes: Negative for blurred vision.   Respiratory: Negative for cough, shortness of breath and wheezing.    Cardiovascular: Negative for chest pain and palpitations.   Gastrointestinal: Negative for heartburn, nausea and vomiting.   Genitourinary: Urinary incontinence. Denies dysuria Musculoskeletal: Negative for falls. Left sided weakness.   Skin: Negative for rash.   Neurological: Negative for dizziness and headaches.    Past Medical History  Diagnosis Date  . Hypertension   . Hyperlipidemia   . Hypothyroidism   . Atrial fibrillation   . Pacemaker   . Shortness of breath   . CHF (congestive heart failure) 05/27/2006; 05/30/2006; 11/06/2008  . Vascular dementia     "due to CVA 06/08/2009"  . Stroke 03/06/2001; 05/30/2009    "light stroke"  . Stroke 06/08/2009    "probable small TIA"  . Stroke 01/06/2002  . Angina 12/22/2003  . Ventilator dependent 06/08/2009    respiratory failure  . H/O: rheumatic fever     "childhood"  . Coronary artery disease   . Left atrial enlargement   . H/O cardiomegaly     diastolic  . Pulmonary hypertension   . Anemia     "iron transfusions"  . Blood transfusion   . History of lower GI bleeding     "multiple; required blood transfusions"  . COPD (chronic obstructive pulmonary  disease)   . GERD (gastroesophageal reflux disease)   . H/O hiatal hernia   . H/O chronic bronchitis   . Asthma   . Arthritis   . Anxiety   . Depression   . Dysphagia as late effect of stroke   . Autoimmune hepatitis 07/12/1999  . Bilateral renal cysts     "2 large on right off the lower pole"  . Chronic renal insufficiency   . Pleural effusion     "moderate right & small left"  . Bacterial pneumonia 11/06/2008; 10/23/2009; 03/30/2010  . Avascular necrosis of femoral head     right  . A-fib 01/04/2013     Medication List       This list is accurate as of: 07/28/14 12:22 PM.  Always use your most recent med list.               acetaminophen 325 MG tablet  Commonly known as:  TYLENOL  Take 650 mg by mouth 2 (two) times daily.     artificial tears Oint ophthalmic ointment  Place 1 application into both eyes at bedtime.     aspirin 81 MG tablet  Take 81 mg by mouth daily.     atorvastatin 10 MG tablet  Commonly known as:  LIPITOR  Take 10 mg by mouth daily.     Cholecalciferol 2000 UNITS Caps  Take by  mouth.     citalopram 10 MG tablet  Commonly known as:  CELEXA  Take 20 mg by mouth daily.     donepezil 10 MG tablet  Commonly known as:  ARICEPT  Take 10 mg by mouth at bedtime.     furosemide 20 MG tablet  Commonly known as:  LASIX  Take 20 mg by mouth 2 (two) times daily.     ipratropium-albuterol 0.5-2.5 (3) MG/3ML Soln  Commonly known as:  DUONEB  Take 3 mLs by nebulization every 6 (six) hours as needed. For shortness of breath     levETIRAcetam 250 MG tablet  Commonly known as:  KEPPRA  Take 500 mg by mouth 2 (two) times daily.     levothyroxine 50 MCG tablet  Commonly known as:  SYNTHROID, LEVOTHROID  Take 50 mcg by mouth daily before breakfast.     losartan 25 MG tablet  Commonly known as:  COZAAR  Take 50 mg by mouth daily.     omeprazole 20 MG capsule  Commonly known as:  PRILOSEC  Take 20 mg by mouth daily.     oyster calcium 500 MG Tabs  tablet  Take 500 mg of elemental calcium by mouth 3 (three) times daily.     senna 8.6 MG Tabs tablet  Commonly known as:  SENOKOT  Take 2 tablets by mouth at bedtime.     tiotropium 18 MCG inhalation capsule  Commonly known as:  SPIRIVA  Place 18 mcg into inhaler and inhale daily.     traMADol 50 MG tablet  Commonly known as:  ULTRAM  Take 1/2 tablet by mouth twice daily for OA pain; Take 1/2 tablet by mouth twice daily as needed for pain     warfarin 4 MG tablet  Commonly known as:  COUMADIN  Take 4 mg by mouth. Tues/Wed/Thurs     warfarin 2.5 MG tablet  Commonly known as:  COUMADIN  Take 3.5 mg by mouth. Sun/Mon/Fri       History   Social History  . Marital Status: Widowed    Spouse Name: N/A    Number of Children: N/A  . Years of Education: N/A   Occupational History  . Not on file.   Social History Main Topics  . Smoking status: Former Smoker -- 1.00 packs/day for 30 years    Types: Cigarettes    Quit date: 10/14/1970  . Smokeless tobacco: Never Used  . Alcohol Use: No  . Drug Use: No  . Sexual Activity: No   Other Topics Concern  . Not on file   Social History Narrative  . No narrative on file   Physical exam BP 118/62  Pulse 80  Temp(Src) 98 F (36.7 C)  Resp 16  Ht 5' (1.524 m)  Wt 123 lb 6.4 oz (55.974 kg)  BMI 24.10 kg/m2  SpO2 95%  constitutional: Thin, in no distress, comfortable Neck: Neck supple. No tracheal deviation present. No thyromegaly present.   Cardiovascular: Normal rate, regular rhythm and intact distal pulses.  pacemaker in right upper chest. Scar from previous sternotomy Respiratory: Effort normal and breath sounds normal. No respiratory distress. She has no wheezes. On o2   GI: Soft. Bowel sounds are normal. She exhibits no distension. There is no tenderness.  Musculoskeletal: She exhibits no edema. 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 Mild left hand contracture Neurological: She is alert and can make  her needs known, baseline mild confusion  Skin: Skin is  warm and dry.   Labs reviewed: 07-19-13 wbc 4.9, hb 10.5, hct 33.5, plt 201, na 140, k 3.8, glu 88, bun 20, cr 1.1, lft wnl, tsh 0.5610 09-07-13 iron 42, tibc 263, ferritin 167, wbc 6, hb 10, hct 33.7, plt 207 04-26-14 wbc 5.6, hb 10.7, hct 35.5, plt 187, na 138, k 3.9, bun 20, cr 1.3, glu 82, ca 9.9 06-23-14 wbc 5, hb 10.7, hct 35.5, plt 201  Health maintenance Influenza vaccine 07/19/14, pneumococcal vaccine 07/19/11, foot exam 12/30/13, eye exam 11/15/13, colonoscopy and mammogram refused, dexa scan refused by family  Assessment/Plan Hypothyroidism Continue levothyroxine current regimen  HTN bp stable, continue cozaar, tolerating well  Dry eyes Continue artifical tears. uptodate on eye exam  Hyperlipidemia Continue lipitor, monitor  Seizure No new seizure. Continue keppra 500 mg bid, seizure precautions  Dementia with depression Continue aricept for now. Continue celexa for mood  chf Stable, continue cozaar 25 mg daily, lasix 20 mg twice daily. Monitor weight  OA Continue vit d supplement with oscal and her tramadol  gerd Continue prilosec  afib Rate controlled. Continue coumadin  Copd Continue her breathing treatment, no changes made  cva  Stable, left sided weakness, continue bp med and coumadin with baby aspirin  ckd stage 3 Monitor clinically   Oneal GroutMAHIMA Wasyl Dornfeld, MD  [facility] [phone] (Monday-Friday 8 am - 5 pm) [phone] (afterhours)

## 2014-09-08 ENCOUNTER — Non-Acute Institutional Stay (SKILLED_NURSING_FACILITY): Payer: PRIVATE HEALTH INSURANCE | Admitting: Internal Medicine

## 2014-09-08 ENCOUNTER — Encounter: Payer: Self-pay | Admitting: Internal Medicine

## 2014-09-08 DIAGNOSIS — G8104 Flaccid hemiplegia affecting left nondominant side: Secondary | ICD-10-CM

## 2014-09-08 DIAGNOSIS — E785 Hyperlipidemia, unspecified: Secondary | ICD-10-CM

## 2014-09-08 DIAGNOSIS — I739 Peripheral vascular disease, unspecified: Secondary | ICD-10-CM

## 2014-09-08 NOTE — Progress Notes (Signed)
Patient ID: [name], female   DOB: [birth year], 80 y.o.   MRN: [id number]    [facility]  Chief complaint- medical management of chronic issues  Allergies reviewed  HPI 80 y/o female patient seen for routine visit. sje is in her bed, at her baseline with no new concerns from patient and staff. Weight remains stable. No new skin concerns.   Review of Systems   Constitutional: Negative for fever  HENT: Negative for congestion.      Respiratory: Negative for cough, shortness of breath and wheezing.    Cardiovascular: Negative for chest pain and palpitations.   Gastrointestinal: Negative for heartburn, nausea and vomiting.   Genitourinary: Urinary incontinence. Denies dysuria Musculoskeletal: Negative for falls. Left sided weakness.   Skin: Negative for rash.   Neurological: Negative for dizziness and headaches.  Past Medical History  Diagnosis Date  . Hypertension   . Hyperlipidemia   . Hypothyroidism   . Atrial fibrillation   . Pacemaker   . Shortness of breath   . CHF (congestive heart failure) 05/27/2006; 05/30/2006; 11/06/2008  . Vascular dementia     "due to CVA 06/08/2009"  . Stroke 03/06/2001; 05/30/2009    "light stroke"  . Stroke 06/08/2009    "probable small TIA"  . Stroke 01/06/2002  . Angina 12/22/2003  . Ventilator dependent 06/08/2009    respiratory failure  . H/O: rheumatic fever     "childhood"  . Coronary artery disease   . Left atrial enlargement   . H/O cardiomegaly     diastolic  . Pulmonary hypertension   . Anemia     "iron transfusions"  . Blood transfusion   . History of lower GI bleeding     "multiple; required blood transfusions"  . COPD (chronic obstructive pulmonary disease)   . GERD (gastroesophageal reflux disease)   . H/O hiatal hernia   . H/O chronic bronchitis   . Asthma   . Arthritis   . Anxiety   . Depression   . Dysphagia as late effect of stroke   . Autoimmune hepatitis 07/12/1999   . Bilateral renal cysts     "2 large on right off the lower pole"  . Chronic renal insufficiency   . Pleural effusion     "moderate right & small left"  . Bacterial pneumonia 11/06/2008; 10/23/2009; 03/30/2010  . Avascular necrosis of femoral head     right  . A-fib 01/04/2013  . Hx of atrioventricular node ablation 09/27/2014   [facility]. [facility]  Physical exam BP 127/65 mmHg  Pulse 72  Temp(Src) 98.3 F (36.8 C)  Resp 18  Wt 121 lb 8 oz (55.112 kg)  SpO2 96%  Constitutional: Thin, in no distress, comfortable Neck: Neck supple. No tracheal deviation present. No thyromegaly present.   Cardiovascular: Normal rate, regular rhythm and intact distal pulses.  pacemaker in right upper chest. Scar from previous sternotomy Respiratory: Effort normal and breath sounds normal. No respiratory distress. She has no wheezes. On o2   GI: Soft. Bowel sounds are normal. She exhibits no distension. There is no tenderness.  Musculoskeletal: She exhibits no edema. 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 Mild left hand contracture Neurological: She is alert and can make her needs known, baseline mild confusion  Skin: Skin is warm and dry.   Labs reviewed: 07-19-13 wbc 4.9, hb 10.5, hct 33.5, plt 201, na 140, k 3.8, glu 88, bun 20, cr  1.1, lft wnl, tsh 0.5610 09-07-13 iron 42, tibc 263, ferritin 167, wbc 6, hb 10, hct 33.7, plt 207 04-26-14 wbc 5.6, hb 10.7, hct 35.5, plt 187, na 138, k 3.9, bun 20, cr 1.3, glu 82, ca 9.9 06-22-14 wbc 5, hb 10.7, hct 35.5, plt 201  Assessment/Plan  PVD Stable, no skin concerns at present, continue aspirin and skin care  Hyperlipidemia On lipitor 10 mg daily for now. 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 Continue pressure ulcer prophylaxis. Continue aspirin and statin with her bp med  Oneal GroutMAHIMA Rindy Kollman, MD  [facility] [phone] (Monday-Friday 8 am - 5 pm) [phone]  (afterhours)

## 2014-09-08 NOTE — Progress Notes (Signed)
This encounter was created in error - please disregard.

## 2014-09-20 ENCOUNTER — Telehealth: Payer: Self-pay | Admitting: Family Medicine

## 2014-09-20 NOTE — Telephone Encounter (Signed)
Patient received flu [facility] on 08/14/14

## 2014-09-22 ENCOUNTER — Encounter (HOSPITAL_COMMUNITY): Payer: Self-pay | Admitting: Cardiovascular Disease

## 2014-09-27 ENCOUNTER — Encounter: Payer: Self-pay | Admitting: Cardiovascular Disease

## 2014-09-27 ENCOUNTER — Ambulatory Visit (INDEPENDENT_AMBULATORY_CARE_PROVIDER_SITE_OTHER): Payer: PRIVATE HEALTH INSURANCE | Admitting: Cardiovascular Disease

## 2014-09-27 VITALS — BP 149/87 | HR 72 | Resp 16 | Ht 60.0 in

## 2014-09-27 DIAGNOSIS — Z9889 Other specified postprocedural states: Secondary | ICD-10-CM

## 2014-09-27 DIAGNOSIS — Z950 Presence of cardiac pacemaker: Secondary | ICD-10-CM

## 2014-09-27 DIAGNOSIS — I482 Chronic atrial fibrillation, unspecified: Secondary | ICD-10-CM

## 2014-09-27 HISTORY — DX: Other specified postprocedural states: Z98.890

## 2014-09-27 LAB — MDC_IDC_ENUM_SESS_TYPE_INCLINIC
Battery Remaining Percentage: 95 %
Lead Channel Impedance Value: 640 Ohm
Lead Channel Pacing Threshold Amplitude: 0.75 V
MDC IDC MSMT BATTERY VOLTAGE: 3.01 V
MDC IDC MSMT LEADCHNL RV PACING THRESHOLD PULSEWIDTH: 0.4 ms
MDC IDC PG SERIAL: 7529661
MDC IDC SET LEADCHNL RV PACING AMPLITUDE: 1 V
MDC IDC SET LEADCHNL RV PACING PULSEWIDTH: 0.4 ms
MDC IDC SET LEADCHNL RV SENSING SENSITIVITY: 2 mV
MDC IDC STAT BRADY RV PERCENT PACED: 93 %

## 2014-09-27 NOTE — Patient Instructions (Signed)
Remote monitoring is used to monitor your pacemaker from home. This monitoring reduces the number of office visits required to check your device to one time per year. It allows us to keep an eye on the functioning of your device to ensure it is working properly. You are scheduled for a device check from home on 12-28-2014. You may send your transmission at any time that day. If you have a wireless device, the transmission will be sent automatically. After your physician reviews your transmission, you will receive a postcard with your next transmission date.  Your physician recommends that you schedule a follow-up appointment with [facility].

## 2014-09-27 NOTE — Progress Notes (Signed)
Patient ID: [name], female   DOB: 09-13-1923, 80 y.o.   MRN: [id number]     Reason for office visit atrial fibrillation, complete heart block,\, pacemaker check  [name] is an 80 year old woman with a history of replacement of the mitral valve with a mechanical prosthesis in 2001, replacement of the aortic valve with a biological prosthesis in 2010 and a single chamber permanent pacemaker for atrial fibrillation and complete heart block following AV node ablation. Secondary to sequelae of stroke she is a resident at [facility].  She received a new pacemaker generator in August 2014. Comprehensive in office device check shows normal device function. There is 93% ventricular pacing. Battery longevity is estimated at greater than 10 years. Lead parameters are excellent.   Allergies  Allergen Reactions  . Advil [Ibuprofen] Other (See Comments)    "think it just doesn't work for her"  . Alprazolam Other (See Comments)    "don't remember what happens"  . Relafen [Nabumetone] Other (See Comments)    "don't remember what happens"  . Sulfa Antibiotics Other (See Comments)    "don't remember what happens"  . Toprol Xl [Metoprolol Succinate] Other (See Comments)    25mg ; "don't remember what happens"    Current Outpatient Prescriptions  Medication Sig Dispense Refill  . acetaminophen (TYLENOL) 325 MG tablet Take 650 mg by mouth 2 (two) times daily.    Marland Kitchen. artificial tears (LACRILUBE) OINT ophthalmic ointment Place 1 application into both eyes at bedtime.    Marland Kitchen. aspirin 81 MG tablet Take 81 mg by mouth daily.    Marland Kitchen. atorvastatin (LIPITOR) 10 MG tablet Take 10 mg by mouth daily.    . Cholecalciferol 2000 UNITS CAPS Take by mouth.    . citalopram (CELEXA) 10 MG tablet Take 20 mg by mouth daily.     Marland Kitchen. donepezil (ARICEPT) 10 MG tablet Take 10 mg by mouth at bedtime.    . furosemide (LASIX) 20 MG tablet Take 20 mg by mouth 2 (two) times daily.    Marland Kitchen. ipratropium-albuterol (DUONEB) 0.5-2.5 (3)  MG/3ML SOLN Take 3 mLs by nebulization every 6 (six) hours as needed. For shortness of breath    . levETIRAcetam (KEPPRA) 250 MG tablet Take 500 mg by mouth 2 (two) times daily.     Marland Kitchen. levothyroxine (SYNTHROID, LEVOTHROID) 50 MCG tablet Take 50 mcg by mouth daily before breakfast.    . losartan (COZAAR) 25 MG tablet Take 50 mg by mouth daily.     Marland Kitchen. omeprazole (PRILOSEC) 20 MG capsule Take 20 mg by mouth daily.    Ethelda Chick. Oyster Shell (OYSTER CALCIUM) 500 MG TABS Take 500 mg of elemental calcium by mouth 3 (three) times daily.    Marland Kitchen. senna (SENOKOT) 8.6 MG TABS Take 2 tablets by mouth at bedtime.    Marland Kitchen. tiotropium (SPIRIVA) 18 MCG inhalation capsule Place 18 mcg into inhaler and inhale daily.    . traMADol (ULTRAM) 50 MG tablet Take 1/2 tablet by mouth twice daily for OA pain; Take 1/2 tablet by mouth twice daily as needed for pain 60 tablet 5  . warfarin (COUMADIN) 2.5 MG tablet Take 3.5 mg by mouth. Sun/Mon/Fri    . warfarin (COUMADIN) 4 MG tablet Take 4 mg by mouth. Tues/Wed/Thurs     No current facility-administered medications for this visit.    Past Medical History  Diagnosis Date  . Hypertension   . Hyperlipidemia   . Hypothyroidism   . Atrial fibrillation   . Pacemaker   .  Shortness of breath   . CHF (congestive heart failure) 05/27/2006; 05/30/2006; 11/06/2008  . Vascular dementia     "due to CVA 06/08/2009"  . Stroke 03/06/2001; 05/30/2009    "light stroke"  . Stroke 06/08/2009    "probable small TIA"  . Stroke 01/06/2002  . Angina 12/22/2003  . Ventilator dependent 06/08/2009    respiratory failure  . H/O: rheumatic fever     "childhood"  . Coronary artery disease   . Left atrial enlargement   . H/O cardiomegaly     diastolic  . Pulmonary hypertension   . Anemia     "iron transfusions"  . Blood transfusion   . History of lower GI bleeding     "multiple; required blood transfusions"  . COPD (chronic obstructive pulmonary disease)   . GERD (gastroesophageal reflux disease)   . H/O  hiatal hernia   . H/O chronic bronchitis   . Asthma   . Arthritis   . Anxiety   . Depression   . Dysphagia as late effect of stroke   . Autoimmune hepatitis 07/12/1999  . Bilateral renal cysts     "2 large on right off the lower pole"  . Chronic renal insufficiency   . Pleural effusion     "moderate right & small left"  . Bacterial pneumonia 11/06/2008; 10/23/2009; 03/30/2010  . Avascular necrosis of femoral head     right  . A-fib 01/04/2013    Past Surgical History  Procedure Laterality Date  . Insert / replace / remove pacemaker  09/11/2000  . Av node ablation  "06/06/2005"  . Percutaneous tracheostomy  06/15/2009  . Electroencephalogram  01/28/2002; 11/26/2005; 10/23/2009  . Cardiac catheterization  06/14/1999; 12/26/2003; 05/30/2009  . Transcranial doppler & carotid ultrasound  11/28/2005  . Cardiac valve replacement  08/20/1999    "mitral valve replacement; [employer] Jude"  . Cardiac valve replacement  06/05/2009     "redo sternotomy & aortic valve replacement; tissue valve"  . Cauterized cecal av malformation    . Abdominal hysterectomy  1960's  . Dental implants removed  03/04/2001    lower  . Knee arthroscopy  12/26/2000    left  . Knee arthroscopy  07/29/2001    right  . Pacemaker generator change N/A 05/24/2013    Procedure: PACEMAKER GENERATOR CHANGE;  Surgeon: Thurmon FairMihai Anastaisa Wooding, MD;  Location: [facility] CATH [facility];  Service: Cardiovascular;  Laterality: N/A;    No family history on file.  History   Social History  . Marital Status: Widowed    Spouse Name: N/A    Number of Children: N/A  . Years of Education: N/A   Occupational History  . Not on file.   Social History Main Topics  . Smoking status: Former Smoker -- 1.00 packs/day for 30 years    Types: Cigarettes    Quit date: 10/14/1970  . Smokeless tobacco: Never Used  . Alcohol Use: No  . Drug Use: No  . Sexual Activity: No   Other Topics Concern  . Not on file   Social History Narrative    Review of systems: She has  a limited ability to communicate but denies problems with chest pain, dyspnea, syncope, recent seizures, new focal neurological deficits, lower extremity edema or other cardiovascular complaints. She is essentially wheelchair-bound. No bleeding problems  PHYSICAL EXAM BP 149/87 mmHg  Pulse 72  Resp 16  Ht 5' (1.524 m)  Wt   General: Alert, oriented to person, no distress Head: no evidence of trauma, PERRL, EOMI,  no exophtalmos or lid lag, no myxedema, no xanthelasma; normal ears, nose and oropharynx Neck: normal jugular venous pulsations and no hepatojugular reflux; brisk carotid pulses without delay and no carotid bruits Chest: clear to auscultation, no signs of consolidation by percussion or palpation, normal fremitus, symmetrical and full respiratory excursions, healthy subclavian pacemaker site Cardiovascular: normal position and quality of the apical impulse, regular rhythm, normal first and second heart sounds with crisp prosthetic valve clicks, 2/6 early peaking systolic ejection murmur in the aortic focus, no diastolic murmurs, rubs or gallops Abdomen: no tenderness or distention, no masses by palpation, no abnormal pulsatility or arterial bruits, normal bowel sounds, no hepatosplenomegaly Extremities: no clubbing, cyanosis or edema; 2+ radial, ulnar and brachial pulses bilaterally; 2+ right femoral, posterior tibial and dorsalis pedis pulses; 2+ left femoral, posterior tibial and dorsalis pedis pulses; no subclavian or femoral bruits Neurological: grossly nonfocal   EKG: Ventricular paced, background atrial fibrillation  Lipid Panel     Component Value Date/Time   CHOL 200 02/05/2012 0550   TRIG 81 02/05/2012 0550   HDL 55 02/05/2012 0550   CHOLHDL 3.6 02/05/2012 0550   VLDL 16 02/05/2012 0550   LDLCALC 129* 02/05/2012 0550    BMET    Component Value Date/Time   NA 138 04/26/2014   NA 141 05/21/2013 1100   K 3.9 04/26/2014   CL 102 05/21/2013 1100   CO2 32 05/21/2013  1100   GLUCOSE 87 05/21/2013 1100   BUN 27* 04/26/2014   BUN 28* 05/21/2013 1100   CREATININE 1.37* 05/21/2013 1100   CALCIUM 10.3 05/21/2013 1100   GFRNONAA 34* 05/21/2013 1100   GFRAA 39* 05/21/2013 1100     ASSESSMENT AND PLAN  Elderly nursing home patient with difficult transportation and normally functioning single chamber permanent pacemaker in the setting of AV node ablation for atrial fibrillation.  She has been very compliant with remote pacemaker follow-up and there have been no problems detected with her device. Since transportation is a major issue will continue with remote pacemaker checks only. She has regular follow-up with Dr. Tresa Endo and if necessary in office pacemaker checks are reprogrammed can be performed at those visits. I would not like to burden her with more doctor's appointments if possible.  Junious Silk, MD, [facility] [phone] office [phone] pager

## 2014-09-29 ENCOUNTER — Encounter: Payer: PRIVATE HEALTH INSURANCE | Admitting: Cardiovascular Disease

## 2014-10-03 ENCOUNTER — Non-Acute Institutional Stay (SKILLED_NURSING_FACILITY): Payer: PRIVATE HEALTH INSURANCE | Admitting: Internal Medicine

## 2014-10-03 DIAGNOSIS — IMO0002 Reserved for concepts with insufficient information to code with codable children: Secondary | ICD-10-CM | POA: Insufficient documentation

## 2014-10-03 DIAGNOSIS — M179 Osteoarthritis of knee, unspecified: Secondary | ICD-10-CM

## 2014-10-03 DIAGNOSIS — M1710 Unilateral primary osteoarthritis, unspecified knee: Secondary | ICD-10-CM | POA: Insufficient documentation

## 2014-10-03 DIAGNOSIS — F0393 Unspecified dementia, unspecified severity, with mood disturbance: Secondary | ICD-10-CM

## 2014-10-03 DIAGNOSIS — G8104 Flaccid hemiplegia affecting left nondominant side: Secondary | ICD-10-CM | POA: Insufficient documentation

## 2014-10-03 DIAGNOSIS — F0280 Dementia in other diseases classified elsewhere without behavioral disturbance: Secondary | ICD-10-CM

## 2014-10-03 DIAGNOSIS — I739 Peripheral vascular disease, unspecified: Secondary | ICD-10-CM | POA: Insufficient documentation

## 2014-10-03 DIAGNOSIS — E785 Hyperlipidemia, unspecified: Secondary | ICD-10-CM | POA: Insufficient documentation

## 2014-10-03 DIAGNOSIS — K219 Gastro-esophageal reflux disease without esophagitis: Secondary | ICD-10-CM | POA: Insufficient documentation

## 2014-10-03 DIAGNOSIS — F329 Major depressive disorder, single episode, unspecified: Secondary | ICD-10-CM

## 2014-10-03 NOTE — Progress Notes (Signed)
Patient ID: Savannah LyonsMary L Aplin, female   DOB: [birth year], 80 y.o.   MRN: [id number]    Facility: [facility]  Chief complaint- medical management of chronic issues  Allergies reviewed  HPI 80 y/o female patient is seen for routine visit. She is lying in her bed, is on o2, asks for some water and then denies any concerns. No new concern from staff. She was seen by cardiology on 09/27/14 for pacemaker check.   Review of Systems   Constitutional: Negative for fever, chills and diaphoresis.   HENT: Negative for congestion.     Respiratory: Negative for cough, shortness of breath and wheezing.    Cardiovascular: Negative for chest pain and palpitations.   Gastrointestinal: Negative for heartburn, nausea and vomiting.   Genitourinary: Urinary incontinence. Denies dysuria Musculoskeletal: Negative for falls. Left sided weakness.   Skin: Negative for rash.   Neurological: Negative for dizziness and headaches.    Past Medical History  Diagnosis Date  . Hypertension   . Hyperlipidemia   . Hypothyroidism   . Atrial fibrillation   . Pacemaker   . Shortness of breath   . CHF (congestive heart failure) 05/27/2006; 05/30/2006; 11/06/2008  . Vascular dementia     "due to CVA 06/08/2009"  . Stroke 03/06/2001; 05/30/2009    "light stroke"  . Stroke 06/08/2009    "probable small TIA"  . Stroke 01/06/2002  . Angina 12/22/2003  . Ventilator dependent 06/08/2009    respiratory failure  . H/O: rheumatic fever     "childhood"  . Coronary artery disease   . Left atrial enlargement   . H/O cardiomegaly     diastolic  . Pulmonary hypertension   . Anemia     "iron transfusions"  . Blood transfusion   . History of lower GI bleeding     "multiple; required blood transfusions"  . COPD (chronic obstructive pulmonary disease)   . GERD (gastroesophageal reflux disease)   . H/O hiatal hernia   . H/O chronic bronchitis   . Asthma   . Arthritis   . Anxiety   .  Depression   . Dysphagia as late effect of stroke   . Autoimmune hepatitis 07/12/1999  . Bilateral renal cysts     "2 large on right off the lower pole"  . Chronic renal insufficiency   . Pleural effusion     "moderate right & small left"  . Bacterial pneumonia 11/06/2008; 10/23/2009; 03/30/2010  . Avascular necrosis of femoral head     right  . A-fib 01/04/2013  . Hx of atrioventricular node ablation 09/27/2014   [facility]. [facility]  Physical exam BP 134/78 mmHg  Pulse 68  Temp(Src) 98 F (36.7 C)  Resp 18  constitutional: Thin, in no distress, comfortable Neck: Neck supple. No tracheal deviation present. No thyromegaly present.   Cardiovascular: Normal rate, regular rhythm and intact distal pulses.  pacemaker in right upper chest. Scar from previous sternotomy. Systolic ejection murmur present Respiratory: Effort normal and breath sounds normal. No respiratory distress. She has no wheezes. On o2   GI: Soft. Bowel sounds are normal. She exhibits no distension. There is no tenderness.  Musculoskeletal: She exhibits no edema. 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 Mild left hand contracture. Good distal pulses Neurological: She is alert and can make her needs known, baseline mild confusion  Skin: Skin is warm and dry.   Labs reviewed: 07-19-13 wbc 4.9, hb 10.5, hct  33.5, plt 201, na 140, k 3.8, glu 88, bun 20, cr 1.1, lft wnl, tsh 0.5610 09-07-13 iron 42, tibc 263, ferritin 167, wbc 6, hb 10, hct 33.7, plt 207 04-26-14 wbc 5.6, hb 10.7, hct 35.5, plt 187, na 138, k 3.9, bun 20, cr 1.3, glu 82, ca 9.9 06-23-14 wbc 5, hb 10.7, hct 35.5, plt 201 09-02-14 wbc 5.4, hb 10.4, hct 33.6, plt 177, na 144, k 3.9, bun 22, cr 1.3, glu 83, ca 10.2, t.chol 142, ldl 83, hdl 48, tg 54  Assessment/Plan  gerd Stable, continue prilosec for now  OA Under control with tylenol and ultram. Monitor clinically  Depression Tolerating current regimen of celexa well, continue  this, stable

## 2014-10-18 ENCOUNTER — Encounter: Payer: Self-pay | Admitting: Cardiovascular Disease

## 2014-10-18 ENCOUNTER — Other Ambulatory Visit: Payer: Self-pay | Admitting: *Deleted

## 2014-10-18 MED ORDER — TRAMADOL HCL 50 MG PO TABS: ORAL_TABLET | ORAL | Status: DC

## 2014-10-18 NOTE — Telephone Encounter (Signed)
[facility] 

## 2014-10-21 ENCOUNTER — Other Ambulatory Visit: Payer: Self-pay | Admitting: *Deleted

## 2014-10-21 MED ORDER — TRAMADOL HCL 50 MG PO TABS: ORAL_TABLET | ORAL | Status: DC

## 2014-10-21 NOTE — Telephone Encounter (Signed)
[facility] 

## 2014-10-27 ENCOUNTER — Encounter (HOSPITAL_COMMUNITY): Payer: Self-pay | Admitting: Cardiovascular Disease

## 2014-10-27 ENCOUNTER — Non-Acute Institutional Stay (SKILLED_NURSING_FACILITY): Payer: Medicare Other | Admitting: Internal Medicine

## 2014-10-27 DIAGNOSIS — G8191 Hemiplegia, unspecified affecting right dominant side: Secondary | ICD-10-CM

## 2014-10-27 DIAGNOSIS — I480 Paroxysmal atrial fibrillation: Secondary | ICD-10-CM

## 2014-10-27 DIAGNOSIS — G8190 Hemiplegia, unspecified affecting unspecified side: Secondary | ICD-10-CM

## 2014-10-27 DIAGNOSIS — F0150 Vascular dementia without behavioral disturbance: Secondary | ICD-10-CM

## 2014-10-27 DIAGNOSIS — F0280 Dementia in other diseases classified elsewhere without behavioral disturbance: Secondary | ICD-10-CM

## 2014-10-27 DIAGNOSIS — F329 Major depressive disorder, single episode, unspecified: Secondary | ICD-10-CM

## 2014-10-27 DIAGNOSIS — F0393 Unspecified dementia, unspecified severity, with mood disturbance: Secondary | ICD-10-CM

## 2014-11-05 NOTE — Progress Notes (Signed)
Patient ID: Savannah LyonsMary L Waites, female   DOB: 25-Feb-1923, 81 y.o.   MRN: [id number]    [facility]ility[facility]  Chief complaint- medical management of chronic issues  Allergies reviewed  HPI 81 y/o female patient is seen for routine visit. she denies any concerns. No new concern from staff. She has a pacemaker in place and is on coumadin. Her celexa dosing was recently adjusted with her depression. She is in the bed all the time and is under total care and had been complaining of pain in her bottom area. Her pain medication was adjusted with ultram dosing increased.  Review of Systems   Constitutional: Negative for fever, chills and diaphoresis.   HENT: Negative for congestion.     Respiratory: Negative for cough, shortness of breath and wheezing. on o2 Cardiovascular: Negative for chest pain and palpitations.   Gastrointestinal: Negative for heartburn, nausea and vomiting.   Genitourinary: Urinary incontinence. Denies dysuria Musculoskeletal: Negative for falls. Left sided weakness.   Skin: Negative for rash.   Neurological: Negative for dizziness and headaches.   Past Medical History  Diagnosis Date  . Hypertension   . Hyperlipidemia   . Hypothyroidism   . Atrial fibrillation   . Pacemaker   . Shortness of breath   . CHF (congestive heart failure) 05/27/2006; 05/30/2006; 11/06/2008  . Vascular dementia     "due to CVA 06/08/2009"  . Stroke 03/06/2001; 05/30/2009    "light stroke"  . Stroke 06/08/2009    "probable small TIA"  . Stroke 01/06/2002  . Angina 12/22/2003  . Ventilator dependent 06/08/2009    respiratory failure  . H/O: rheumatic fever     "childhood"  . Coronary artery disease   . Left atrial enlargement   . H/O cardiomegaly     diastolic  . Pulmonary hypertension   . Anemia     "iron transfusions"  . Blood transfusion   . History of lower GI bleeding     "multiple; required blood transfusions"  . COPD (chronic obstructive  pulmonary disease)   . GERD (gastroesophageal reflux disease)   . H/O hiatal hernia   . H/O chronic bronchitis   . Asthma   . Arthritis   . Anxiety   . Depression   . Dysphagia as late effect of stroke   . Autoimmune hepatitis 07/12/1999  . Bilateral renal cysts     "2 large on right off the lower pole"  . Chronic renal insufficiency   . Pleural effusion     "moderate right & small left"  . Bacterial pneumonia 11/06/2008; 10/23/2009; 03/30/2010  . Avascular necrosis of femoral head     right  . A-fib 01/04/2013  . Hx of atrioventricular node ablation 09/27/2014   Medication [facility]. [facility]  Physical exam BP 132/78 mmHg  Pulse 82  Temp(Src) 97.6 F (36.4 C)  Resp 18  SpO2 95%'  Constitutional: Thin, in no distress, comfortable Neck: Neck supple. No tracheal deviation present. No thyromegaly present.   Cardiovascular: Normal rate, regular rhythm and intact distal pulses.  pacemaker in right upper chest. Scar from previous sternotomy. Systolic ejection murmur present Respiratory: Effort normal and breath sounds normal. No respiratory distress. She has no wheezes. On o2   GI: Soft. Bowel sounds are normal. She exhibits no distension. There is no tenderness.  Musculoskeletal: She exhibits no edema. 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 Mild left hand contracture. Good distal pulses Neurological: She is  alert and can make her needs known, baseline mild confusion  Skin: Skin is warm and dry.   Labs reviewed: 07-19-13 wbc 4.9, hb 10.5, hct 33.5, plt 201, na 140, k 3.8, glu 88, bun 20, cr 1.1, lft wnl, tsh 0.5610 09-07-13 iron 42, tibc 263, ferritin 167, wbc 6, hb 10, hct 33.7, plt 207 04-26-14 wbc 5.6, hb 10.7, hct 35.5, plt 187, na 138, k 3.9, bun 20, cr 1.3, glu 82, ca 9.9 06-23-14 wbc 5, hb 10.7, hct 35.5, plt 201 09-02-14 wbc 5.4, hb 10.4, hct 33.6, plt 177, na 144, k 3.9, bun 22, cr 1.3, glu 83, ca 10.2, t.chol 142, ldl 83, hdl 48, tg  54  Assessment/Plan  Depression with dementia Tolerating recently increased dosing of celexa 20 mg daily well. Monitor clinically  afib Rate controlled, has a pacemaker. Continue coumadin with inr check on 11/01/14  Hemiplegia  Post old cva, continue ultram 50 mg bid and bengay to her joints for pain  Vascular dementia Continue aricept for now, is bed bound and under total care. Continue skin care and assistance with her ADLs. Decline anticipated. Monitor weight.

## 2014-12-05 ENCOUNTER — Non-Acute Institutional Stay (SKILLED_NURSING_FACILITY): Payer: Medicare Other | Admitting: Internal Medicine

## 2014-12-05 ENCOUNTER — Encounter: Payer: Self-pay | Admitting: Internal Medicine

## 2014-12-05 DIAGNOSIS — M1712 Unilateral primary osteoarthritis, left knee: Secondary | ICD-10-CM | POA: Diagnosis not present

## 2014-12-05 DIAGNOSIS — E440 Moderate protein-calorie malnutrition: Secondary | ICD-10-CM | POA: Diagnosis not present

## 2014-12-05 DIAGNOSIS — E890 Postprocedural hypothyroidism: Secondary | ICD-10-CM | POA: Diagnosis not present

## 2014-12-05 DIAGNOSIS — E785 Hyperlipidemia, unspecified: Secondary | ICD-10-CM

## 2014-12-05 DIAGNOSIS — I739 Peripheral vascular disease, unspecified: Secondary | ICD-10-CM | POA: Diagnosis not present

## 2014-12-05 NOTE — Progress Notes (Signed)
Patient ID: [name], female   DOB: 07-Mar-1923, 81 y.o.   MRN: [id number]     [facility]  Chief complaint- medical management of chronic issues  Allergies reviewed  HPI 81 y/o female patient is seen for routine visit. she complaints of left knee pain. Her ultram dosing was adjusted to help with this and it has been helpful. Her weight has been stable. No wounds. Is on chronic anticoagulation. Is on continuous oxygen  Review of Systems   Constitutional: Negative for fever, chills and diaphoresis.   HENT: Negative for congestion.     Respiratory: Negative for cough, shortness of breath and wheezing. on o2 Cardiovascular: Negative for chest pain and palpitations.   Gastrointestinal: Negative for heartburn, nausea and vomiting.   Genitourinary: Urinary incontinence. Denies dysuria Musculoskeletal: Negative for falls. Left sided weakness.   Skin: Negative for rash.   Neurological: Negative for dizziness and headaches.   Past medical history reviewed  Medication reviewed. [facility]  Physical exam BP 112/80 mmHg  Pulse 72  Temp(Src) 98.4 F (36.9 C)  Resp 18  SpO2 96%  Constitutional: Thin, frail, in no distress, comfortable Neck: Neck supple. No tracheal deviation present. No thyromegaly present.   Cardiovascular: Normal rate, regular rhythm and intact distal pulses.  pacemaker in right upper chest. Scar from previous sternotomy. Systolic ejection murmur present Respiratory: Effort normal and breath sounds normal. No respiratory distress. She has no wheezes. On o2   GI: Soft. Bowel sounds are normal. She exhibits no distension. There is no tenderness.  Musculoskeletal: She exhibits no edema. 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 Mild left hand contracture. Good distal pulses. Left knee no swelling or redness noted Neurological: She is alert and can make her needs known, baseline mild confusion  Skin: Skin is  warm and dry.   Labs reviewed: 07-19-13 wbc 4.9, hb 10.5, hct 33.5, plt 201, na 140, k 3.8, glu 88, bun 20, cr 1.1, lft wnl, tsh 0.5610 09-07-13 iron 42, tibc 263, ferritin 167, wbc 6, hb 10, hct 33.7, plt 207 04-26-14 wbc 5.6, hb 10.7, hct 35.5, plt 187, na 138, k 3.9, bun 20, cr 1.3, glu 82, ca 9.9 06-23-14 wbc 5, hb 10.7, hct 35.5, plt 201 09-02-14 wbc 5.4, hb 10.4, hct 33.6, plt 177, na 144, k 3.9, bun 22, cr 1.3, glu 83, ca 10.2, t.chol 142, ldl 83, hdl 48, tg 54 11-16-14 wbc 4.8, hb 10.6, hct 31.8, plt 198, na 139, k 3.8, bun 22, cr 1.27, glu 82, ca 9.6, t.chol 126, tg 80, ldl 62, hdl 48, tsh 2.164  Assessment/Plan  PVD Continue baby aspirin, stable, no wounds, pressure ulcer prophylaxis  Hyperlipidemia Lipid panel reviewed, normal. Continue lipitor 10 mg daily  PCM Improved and stable weight. Continue supplements for now  Knee OA On ultram 50 mg bid with oscal and vitamin d, add tylenol 650 mg q8h prn for breakthrough pain  Hypothyroidism Stable tsh, continue levothyroxine 50 mcg daily

## 2014-12-05 NOTE — Progress Notes (Signed)
This encounter was created in error - please disregard.

## 2014-12-22 ENCOUNTER — Ambulatory Visit: Payer: PRIVATE HEALTH INSURANCE | Admitting: Cardiovascular Disease

## 2014-12-28 ENCOUNTER — Ambulatory Visit (INDEPENDENT_AMBULATORY_CARE_PROVIDER_SITE_OTHER): Payer: Medicare Other | Admitting: *Deleted

## 2014-12-28 DIAGNOSIS — I442 Atrioventricular block, complete: Secondary | ICD-10-CM | POA: Diagnosis not present

## 2014-12-28 LAB — MDC_IDC_ENUM_SESS_TYPE_REMOTE
Battery Remaining Longevity: 142 mo
Battery Remaining Percentage: 95.5 %
Brady Statistic RV Percent Paced: 95 %
Date Time Interrogation Session: 20160316060017
Implantable Pulse Generator Model: 2240
Implantable Pulse Generator Serial Number: 7529661
Lead Channel Pacing Threshold Amplitude: 0.75 V
Lead Channel Setting Pacing Amplitude: 1 V
Lead Channel Setting Pacing Pulse Width: 0.4 ms
Lead Channel Setting Sensing Sensitivity: 2 mV
MDC IDC MSMT BATTERY VOLTAGE: 3.01 V
MDC IDC MSMT LEADCHNL RV IMPEDANCE VALUE: 680 Ohm
MDC IDC MSMT LEADCHNL RV PACING THRESHOLD PULSEWIDTH: 0.4 ms
MDC IDC MSMT LEADCHNL RV SENSING INTR AMPL: 12 mV

## 2014-12-28 NOTE — Progress Notes (Signed)
Remote pacemaker transmission.   

## 2015-01-02 ENCOUNTER — Non-Acute Institutional Stay (SKILLED_NURSING_FACILITY): Payer: Medicare Other | Admitting: Internal Medicine

## 2015-01-02 ENCOUNTER — Encounter: Payer: Self-pay | Admitting: Internal Medicine

## 2015-01-02 DIAGNOSIS — N183 Chronic kidney disease, stage 3 (moderate): Secondary | ICD-10-CM

## 2015-01-02 DIAGNOSIS — M2550 Pain in unspecified joint: Secondary | ICD-10-CM

## 2015-01-02 DIAGNOSIS — N189 Chronic kidney disease, unspecified: Secondary | ICD-10-CM | POA: Insufficient documentation

## 2015-01-02 DIAGNOSIS — K219 Gastro-esophageal reflux disease without esophagitis: Secondary | ICD-10-CM

## 2015-01-02 DIAGNOSIS — I5022 Chronic systolic (congestive) heart failure: Secondary | ICD-10-CM

## 2015-01-02 NOTE — Progress Notes (Signed)
This encounter was created in error - please disregard.

## 2015-01-02 NOTE — Progress Notes (Signed)
Patient ID: [name], female   DOB: 09-25-1923, 81 y.o.   MRN: [id number]    [facility]  Chief complaint- medical management of chronic issues  Allergies reviewed- sulfa, xanax, advil, toprol, novocain  Code status- full code  HPI 81 y/o female patient is seen for routine visit. she complaints of left knee and ankle pain. Denies any other concerns. Out of bed in dayroom for meals. bp reading and weight has been stable. She can make her needs known. Is on continuous oxygen  Review of Systems   Constitutional: Negative for fever, chills and diaphoresis.   HENT: Negative for congestion.     Respiratory: Negative for cough, shortness of breath and wheezing. on o2 Cardiovascular: Negative for chest pain and palpitations.   Gastrointestinal: Negative for heartburn, nausea and vomiting.   Genitourinary: Urinary incontinence. Denies dysuria Musculoskeletal: Negative for falls. Left sided weakness.   Skin: Negative for rash.   Neurological: Negative for dizziness and headaches.   Past medical history reviewed  Medication [facility]. [facility]  Physical exam BP 113/77 mmHg  Pulse 72  Temp(Src) 98 F (36.7 C)  Resp 18  SpO2 99%  Constitutional: Thin, frail, in no distress, comfortable Neck: Neck supple. No tracheal deviation present. No thyromegaly present.   Cardiovascular: Normal rate, regular rhythm and intact distal pulses.  pacemaker in right upper chest. Scar from previous sternotomy. Systolic ejection murmur present Respiratory: Effort normal and breath sounds normal. No respiratory distress. She has no wheezes. On o2   GI: Soft. Bowel sounds are normal. She exhibits no distension. There is no tenderness.  Musculoskeletal: She exhibits no edema. 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 Mild left hand contracture. Good distal pulses. Left knee and ankle: no swelling or redness noted, normal skin  temperature Neurological: She is alert and can make her needs known, baseline mild confusion  Skin: Skin is warm and dry.   Labs reviewed: 07-19-13 wbc 4.9, hb 10.5, hct 33.5, plt 201, na 140, k 3.8, glu 88, bun 20, cr 1.1, lft wnl, tsh 0.5610 09-07-13 iron 42, tibc 263, ferritin 167, wbc 6, hb 10, hct 33.7, plt 207 04-26-14 wbc 5.6, hb 10.7, hct 35.5, plt 187, na 138, k 3.9, bun 20, cr 1.3, glu 82, ca 9.9 06-23-14 wbc 5, hb 10.7, hct 35.5, plt 201 09-02-14 wbc 5.4, hb 10.4, hct 33.6, plt 177, na 144, k 3.9, bun 22, cr 1.3, glu 83, ca 10.2, t.chol 142, ldl 83, hdl 48, tg 54 11-16-14 wbc 4.8, hb 10.6, hct 31.8, plt 198, na 139, k 3.8, bun 22, cr 1.27, glu 82, ca 9.6, t.chol 126, tg 80, ldl 62, hdl 48, tsh 2.164  Assessment/Plan  Joint pain No signs of active inflammation or acute flare on exam. Check uric acid level. Likely from her DJD. On tylenol 650 mg bid, tramadol 50 mg bid, tylenol 650 mg q8h prn with bengay bid prn. Start meloxicam 15 mg daily to see if anti-inflammmatory drug would provide relief. Stop tylenol. Has impaired renal function, thus avoid other NSAIDS. reassess  ckd stage 3 Stable, continue bp medications cozaar 50 mg daily and monitor renal function  gerd Stable on prilosec 20 mg daily  chf Stable, continue her lasix and losartan with o2

## 2015-01-04 ENCOUNTER — Encounter: Payer: Self-pay | Admitting: *Deleted

## 2015-01-12 ENCOUNTER — Encounter: Payer: Self-pay | Admitting: Cardiovascular Disease

## 2015-01-24 ENCOUNTER — Ambulatory Visit (INDEPENDENT_AMBULATORY_CARE_PROVIDER_SITE_OTHER): Payer: Medicare Other | Admitting: Cardiovascular Disease

## 2015-01-24 ENCOUNTER — Ambulatory Visit: Payer: Medicaid Other | Admitting: Cardiovascular Disease

## 2015-01-24 VITALS — BP 106/84 | HR 74 | Ht 61.0 in | Wt 121.0 lb

## 2015-01-24 DIAGNOSIS — G40909 Epilepsy, unspecified, not intractable, without status epilepticus: Secondary | ICD-10-CM

## 2015-01-24 DIAGNOSIS — I482 Chronic atrial fibrillation, unspecified: Secondary | ICD-10-CM

## 2015-01-24 DIAGNOSIS — F0150 Vascular dementia without behavioral disturbance: Secondary | ICD-10-CM | POA: Diagnosis not present

## 2015-01-24 DIAGNOSIS — Z954 Presence of other heart-valve replacement: Secondary | ICD-10-CM

## 2015-01-24 DIAGNOSIS — Z952 Presence of prosthetic heart valve: Secondary | ICD-10-CM

## 2015-01-24 NOTE — Progress Notes (Signed)
Patient ID: [name], female   DOB: 03-14-1923, 81 y.o.   MRN: [id number]     HPI: [name] is a 81 y.o. female who presented to the office for an 8 month cardiology follow up evaluation.  [name] has remote history of rheumatic fever during childhood. In 2001 she underwent St. Jude's mitral valve replacement and also at that time underwent permanent transvenous pacemaker for symptomatically bradycardic., She has undergone AV node ablation for atrial fibrillation with associated left atrial flutter and difficult to control ventricular response. Because of progressive aortic valve stenosis she underwent bioprosthetic valve replacement in August 2010. She initially tolerated surgery well, but unfortunately postoperatively developed a significant stroke. Subsequently, she has been in a nursing home and more recently at [facility].  In August 2014, she underwent permanent pacemaker generator change when the pacemaker was found to be end-of-life.  This was done by Dr. Sallyanne Kuster.  [name] is  wheelchair bound and  never fully recovered from her significant right temporal infarct  following her aortic valve replacement surgery.. She does have some dementia as well some chronic depression. She  reestablish care with me in January 2015 after Dr. Lowella Fairy retirement.  [name] has a history of seizure disorder.  Apparently, last weekend, she had 2 short-lived seizures.  Apparently, she was started on Keppra 250 mg 2 times daily.  She denies chest pain.  She is unaware of palpitations.  She denies presyncope or syncope.  She is here today with her daughter, who remains very active in her care and also works in urgent care with Dr. Everlene Farrier.  Since I last saw her, she saw Dr. Sallyanne Kuster for pacemaker follow-up evaluation.  She continues to live at [facility].  Laboratory has been checked by the nurse practitioner [facility].  Patient denies recent chest pain.  She denies significant leg swelling.   She is unaware of her heart racing.  Past Medical History  Diagnosis Date  . Hypertension   . Hyperlipidemia   . Hypothyroidism   . Atrial fibrillation   . Pacemaker   . Shortness of breath   . CHF (congestive heart failure) 05/27/2006; 05/30/2006; 11/06/2008  . Vascular dementia     "due to CVA 06/08/2009"  . Stroke 03/06/2001; 05/30/2009    "light stroke"  . Stroke 06/08/2009    "probable small TIA"  . Stroke 01/06/2002  . Angina 12/22/2003  . Ventilator dependent 06/08/2009    respiratory failure  . H/O: rheumatic fever     "childhood"  . Coronary artery disease   . Left atrial enlargement   . H/O cardiomegaly     diastolic  . Pulmonary hypertension   . Anemia     "iron transfusions"  . Blood transfusion   . History of lower GI bleeding     "multiple; required blood transfusions"  . COPD (chronic obstructive pulmonary disease)   . GERD (gastroesophageal reflux disease)   . H/O hiatal hernia   . H/O chronic bronchitis   . Asthma   . Arthritis   . Anxiety   . Depression   . Dysphagia as late effect of stroke   . Autoimmune hepatitis 07/12/1999  . Bilateral renal cysts     "2 large on right off the lower pole"  . Chronic renal insufficiency   . Pleural effusion     "moderate right & small left"  . Bacterial pneumonia 11/06/2008; 10/23/2009; 03/30/2010  . Avascular necrosis of femoral head  right  . A-fib 01/04/2013  . Hx of atrioventricular node ablation 09/27/2014    Past Surgical History  Procedure Laterality Date  . Insert / replace / remove pacemaker  09/11/2000  . Av node ablation  "06/06/2005"  . Percutaneous tracheostomy  06/15/2009  . Electroencephalogram  01/28/2002; 11/26/2005; 10/23/2009  . Cardiac catheterization  06/14/1999; 12/26/2003; 05/30/2009  . Transcranial doppler & carotid ultrasound  11/28/2005  . Cardiac valve replacement  08/20/1999    "mitral valve replacement; St. Jude"  . Cardiac valve replacement  06/05/2009     "redo sternotomy & aortic valve  replacement; tissue valve"  . Cauterized cecal av malformation    . Abdominal hysterectomy  1960's  . Dental implants removed  03/04/2001    lower  . Knee arthroscopy  12/26/2000    left  . Knee arthroscopy  07/29/2001    right  . Pacemaker generator change N/A 05/24/2013    Procedure: PACEMAKER GENERATOR CHANGE;  Surgeon: Sanda Klein, MD;  Location: [facility];  Service: Cardiovascular;  Laterality: N/A;    Allergies  Allergen Reactions  . Advil [Ibuprofen] Other (See Comments)    "think it just doesn't work for her"  . Alprazolam Other (See Comments)    "don't remember what happens"  . Relafen [Nabumetone] Other (See Comments)    "don't remember what happens"  . Sulfa Antibiotics Other (See Comments)    "don't remember what happens"  . Toprol Xl [Metoprolol Succinate] Other (See Comments)    65m; "don't remember what happens"    Current Outpatient Prescriptions  Medication Sig Dispense Refill  . acetaminophen (TYLENOL) 325 MG tablet Take 650 mg by mouth 2 (two) times daily.    .Marland Kitchenartificial tears (LACRILUBE) OINT ophthalmic ointment Place 1 application into both eyes at bedtime.    .Marland Kitchenaspirin 81 MG tablet Take 81 mg by mouth daily.    .Marland Kitchenatorvastatin (LIPITOR) 10 MG tablet Take 10 mg by mouth daily.    . Cholecalciferol 2000 UNITS CAPS Take by mouth.    . citalopram (CELEXA) 10 MG tablet Take 20 mg by mouth daily.     .Marland Kitchendonepezil (ARICEPT) 10 MG tablet Take 10 mg by mouth at bedtime.    . furosemide (LASIX) 20 MG tablet Take 20 mg by mouth 2 (two) times daily.    .Marland Kitchenipratropium-albuterol (DUONEB) 0.5-2.5 (3) MG/3ML SOLN Take 3 mLs by nebulization every 6 (six) hours as needed. For shortness of breath    . levETIRAcetam (KEPPRA) 250 MG tablet Take 500 mg by mouth 2 (two) times daily.     .Marland Kitchenlevothyroxine (SYNTHROID, LEVOTHROID) 50 MCG tablet Take 50 mcg by mouth daily before breakfast.    . losartan (COZAAR) 25 MG tablet Take 50 mg by mouth daily.     .Marland Kitchenomeprazole (PRILOSEC) 20  MG capsule Take 20 mg by mouth daily.    .Loma [place](OYSTER CALCIUM) 500 MG TABS Take 500 mg of elemental calcium by mouth 3 (three) times daily.    .Marland Kitchensenna (SENOKOT) 8.6 MG TABS Take 2 tablets by mouth at bedtime.    .Marland Kitchentiotropium (SPIRIVA) 18 MCG inhalation capsule Place 18 mcg into inhaler and inhale daily.    . traMADol (ULTRAM) 50 MG tablet Take one tablet by mouth twice daily for OA/Pain 60 tablet 5  . warfarin (COUMADIN) 2.5 MG tablet Take 3.5 mg by mouth. Sun/Mon/Fri    . warfarin (COUMADIN) 4 MG tablet Take 4 mg by mouth. Tues/Wed/Thurs  No current facility-administered medications for this visit.    History   Social History  . Marital Status: Widowed    Spouse Name: N/A  . Number of Children: N/A  . Years of Education: N/A   Occupational History  . Not on file.   Social History Main Topics  . Smoking status: Former Smoker -- 1.00 packs/day for 30 years    Types: Cigarettes    Quit date: 10/14/1970  . Smokeless tobacco: Never Used  . Alcohol Use: No  . Drug Use: No  . Sexual Activity: No   Other Topics Concern  . Not on file   Social History Narrative    History reviewed. No pertinent family history.  ROS General: Negative; No fevers, chills, or night sweats; positive for chronic weakness HEENT: Negative; No changes in vision or hearing, sinus congestion, difficulty swallowing Pulmonary: Negative; No cough, wheezing, shortness of breath, hemoptysis Cardiovascular: Negative; No chest pain, presyncope, syncope, palpitations GI: Negative; No nausea, vomiting, diarrhea, or abdominal pain GU: Negative; No dysuria, hematuria, or difficulty voiding Musculoskeletal: Negative; no myalgias, joint pain, or weakness Hematologic/Oncology: Negative; no easy bruising, bleeding Endocrine: Positive for hypothyroidism no heat/cold intolerance; no diabetes Neuro: Positive for stroke with residual weakness; positive for seizures; positive for dementia Skin: Negative; No  rashes or skin lesions Psychiatric: Negative; No behavioral problems, depression Sleep: Negative; No snoring, daytime sleepiness, hypersomnolence, bruxism, restless legs, hypnogognic hallucinations, no cataplexy Other comprehensive 14 point system review is negative.  PE BP 106/84 mmHg  Pulse 74  Ht _0  (1.549 m)  Wt 121 lb (54.885 kg)  BMI 22.87 kg/m2  General: Alert, oriented, no distress; appears frail with significant thoracic kyphosis. Skin: normal turgor, no rashes HEENT: Normocephalic, atraumatic. Pupils round and reactive; sclera anicteric;no lid lag. Extraocular muscles intact. No xanthelasmas Nose without nasal septal hypertrophy Mouth/Parynx benign; Mallinpatti scale 3 Neck: No JVD, no carotid bruits; normal carotid upstroke Lungs: clear to ausculatation and percussion; no wheezing or rales Chest wall: no tenderness to palpitation Heart: RRR, s1 s2 normal 2/6 systolic murmur with crisp valve clicks. No S3 gallop. No diastolic murmur of aortic insufficiency.  Abdomen: soft, nontender; no hepatosplenomehaly, BS+; abdominal aorta nontender and not dilated by palpation. Back: no CVA tenderness; significant thoracic kyphosis. Pulses 2+ Extremities: no clubbing cyanosis or edema, Homan's sign negative  Neurologic: Residual weakness. Psychologic: depressed affect  ECG (independently read by me): V paced rhythm at 74 bpm  ECG (independently read by me): Ventricular paced rhythm at 76 beats per minute with occasional PVC  LABS:  BMET  BMP Latest Ref Rng 04/26/2014 05/21/2013 06/09/2012  Glucose 70 - 99 mg/dL - 87 81  BUN 4 - 21 mg/dL 27(A) 28(H) 12  Creatinine 0.50 - 1.10 mg/dL - 1.37(H) 0.99  Sodium 137 - 147 mmol/L 138 141 143  Potassium 3.4 - 5.3 mmol/L 3.9 3.9 3.4(L)  Chloride 96 - 112 mEq/L - 102 104  CO2 19 - 32 mEq/L - 32 27  Calcium 8.4 - 10.5 mg/dL - 10.3 10.6(H)     Hepatic Function Panel    Hepatic Function Latest Ref Rng 01/17/2014 06/09/2012 06/08/2012    Total Protein 6.0 - 8.3 g/dL - 8.8(H) 8.5(H)  Albumin 3.5 - 5.2 g/dL - 3.8 3.5  AST 13 - 35 U/L _1 ALT 7 - 35 U/L _2 Alk Phosphatase 39 - 117 U/L - 87 84  Total Bilirubin 0.3 - 1.2 mg/dL - 1.0 0.7   CBC  CBC Latest Ref Rng 04/26/2014 05/28/2013 05/27/2013  WBC - 5.6 5.5 5.9  Hemoglobin 12.0 - 16.0 g/dL 10.7(A) 11.5(L) 11.6(L)  Hematocrit 36 - 46 % 36 35.2(L) 35.2(L)  Platelets 150 - 399 K/L 187 173 193   Lab Results  Component Value Date   TSH 0.454 06/09/2012    BNP    Component Value Date/Time   PROBNP 143.0* 02/17/2010 1311    Lipid Panel     Component Value Date/Time   CHOL 200 02/05/2012 0550   TRIG 81 02/05/2012 0550   HDL 55 02/05/2012 0550   CHOLHDL 3.6 02/05/2012 0550   VLDL 16 02/05/2012 0550   LDLCALC 129* 02/05/2012 0550     RADIOLOGY: No results found.    ASSESSMENT AND PLAN:  [name] is an 81 year old, African American female who had been very active prior to her disability secondary to her extensive stroke and continue to work at [employer]  prior to her last surgery.  She did suffer rheumatic fever during childhood. She is 15years status post St. Jude mitral valve replacement and 6 years status post bioprosthetic aortic valve replacement for development of severe aortic valvular stenosis. She had her initial pacemaker implanted in 2001. In August 2014 her pacemaker was end-of-life and essentially failed.  Presently, she denies any chest pain.  She denies any recent seizure disorder and is tolerating her Keppra.  She denies CHF symptoms.  She continues to have hemiparesis.  She was seen in December 2015 at which time her pacemaker was interrogated and she was seen by Dr. Sallyanne Kuster.  She had normal device function.  Battery longevity was estimated at greater than 10 years.  Her daughter states that laboratory is followed pretty closely by her nurse practitioner.  I will try to obtain these results for my review.  Clinically, she is  fairly well compensated.  We discussed following her clinically versus repeating echo assessment.  Since she appears clinically stable and is not a candidate for repeat valve operation,  I will not schedule her for follow-up echo Doppler study unless her clinical symptoms change.  She is on chronic anticoagulation with Coumadin.  Her blood pressure today is stable on losartan 50 mg furosemide 20 mg.  She continues to be on atorvastatin 10 mg for hyperlipidemia.  She remains on levothyroxine 50 g for hypothyroidism.  As long as she remains stable, I will see her in one year for reevaluation.  Time spent: 25 minutes Troy Sine, MD, [facility]  01/26/2015 7:50 PM

## 2015-01-24 NOTE — Patient Instructions (Signed)
Your physician wants you to follow-up in: 1 Year. You will receive a reminder letter in the mail two months in advance. If you don't receive a letter, please call our office to schedule the follow-up appointment.  

## 2015-01-26 ENCOUNTER — Encounter: Payer: Self-pay | Admitting: Cardiovascular Disease

## 2015-02-02 ENCOUNTER — Non-Acute Institutional Stay (SKILLED_NURSING_FACILITY): Payer: Medicare Other | Admitting: Internal Medicine

## 2015-02-02 DIAGNOSIS — E785 Hyperlipidemia, unspecified: Secondary | ICD-10-CM

## 2015-02-02 DIAGNOSIS — I739 Peripheral vascular disease, unspecified: Secondary | ICD-10-CM

## 2015-02-02 DIAGNOSIS — M150 Primary generalized (osteo)arthritis: Secondary | ICD-10-CM

## 2015-02-02 DIAGNOSIS — K219 Gastro-esophageal reflux disease without esophagitis: Secondary | ICD-10-CM

## 2015-02-02 DIAGNOSIS — R1013 Epigastric pain: Secondary | ICD-10-CM

## 2015-02-02 DIAGNOSIS — M159 Polyosteoarthritis, unspecified: Secondary | ICD-10-CM

## 2015-02-02 NOTE — Progress Notes (Signed)
Patient ID: Geoffery LyonsMary L Alridge, female   DOB: Aug 29, 1923, 81 y.o.   MRN: [id number]    [facility]   Chief complaint- medical management of chronic issues  Allergies reviewed- sulfa, xanax, advil, toprol, novocain  Code status- full code  HPI 81 y/o female patient is seen for routine visit. she complaints of abdominal discomfort in her epigastric area. Denies any nausea or vomiting. Denies abdominal cramp. It is like vague discomfort, on and off. Has been off meloxicam and tolerating scheduled tylenol for pain. Denies joint pain today. She can make her needs known. Is on continuous oxygen- repositioned during her visit.  Review of Systems   Constitutional: Negative for fever, chills and diaphoresis.   HENT: Negative for congestion.     Respiratory: Negative for cough, shortness of breath and wheezing. on o2 Cardiovascular: Negative for chest pain and palpitations.   Gastrointestinal: Negative for heartburn, nausea and vomiting.   Genitourinary: Urinary incontinence. Denies dysuria Musculoskeletal: Negative for falls. Left sided weakness.   Skin: Negative for rash.   Neurological: Negative for dizziness and headaches.   Past medical history reviewed  [facility]  Physical exam BP 127/69 mmHg  Pulse 62  Temp(Src) 98 F (36.7 C)  Resp 16  SpO2 96% on o2 by nasal canula  Constitutional: Thin, frail, in no distress, comfortable Neck: Neck supple. No tracheal deviation present. No thyromegaly present.   Cardiovascular: Normal rate, regular rhythm and intact distal pulses.  pacemaker in right upper chest. Scar from previous sternotomy. Systolic ejection murmur present Respiratory: Effort normal and breath sounds normal. No respiratory distress. She has no wheezes. On o2   GI: Soft. Bowel sounds are normal. No epigastric tenderness. She exhibits no distension. There is no tenderness.  Musculoskeletal: She exhibits no edema.  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 Mild left hand contracture. Good distal pulses. Left knee and ankle: no swelling or redness noted, normal skin temperature Neurological: She is alert and can make her needs known, baseline mild confusion  Skin: Skin is warm and dry.   Labs reviewed: 07-19-13 wbc 4.9, hb 10.5, hct 33.5, plt 201, na 140, k 3.8, glu 88, bun 20, cr 1.1, lft wnl, tsh 0.5610 09-07-13 iron 42, tibc 263, ferritin 167, wbc 6, hb 10, hct 33.7, plt 207 04-26-14 wbc 5.6, hb 10.7, hct 35.5, plt 187, na 138, k 3.9, bun 20, cr 1.3, glu 82, ca 9.9 06-23-14 wbc 5, hb 10.7, hct 35.5, plt 201 09-02-14 wbc 5.4, hb 10.4, hct 33.6, plt 177, na 144, k 3.9, bun 22, cr 1.3, glu 83, ca 10.2, t.chol 142, ldl 83, hdl 48, tg 54 11-16-14 wbc 4.8, hb 10.6, hct 31.8, plt 198, na 139, k 3.8, bun 22, cr 1.27, glu 82, ca 9.6, t.chol 126, tg 80, ldl 62, hdl 48, tsh 2.164  Assessment/Plan  Epigastric discomfort With her being on meloxicam recently, concern of gastritis which could be causing the pain/discomfort. On omeprazole 20 mg daily. Add ranitidine 150 mg bid x 1 week, then 150 mg daily and reassess her symptom. If no improvement, consider increasing her PPI. aricpet is another med in her medlist which can cause gi upset. Check cbc next lab  Joint pain No signs of active inflammation or acute flare on exam. Tolerating tylenol and tramadol well. Check lft next lab  gerd Continue prilosec 20 mg daily  HLD Continue lipitor 10 mg daily  PVD Continue baby  aspirin daily for now

## 2015-02-03 LAB — LIPID PANEL
Cholesterol: 131 mg/dL (ref 0–200)
HDL: 54 mg/dL (ref 35–70)
LDL Cholesterol: 64 mg/dL
Triglycerides: 66 mg/dL (ref 40–160)

## 2015-02-03 LAB — CBC AND DIFFERENTIAL
HEMATOCRIT: 30 % — AB (ref 36–46)
Hemoglobin: 10.1 g/dL — AB (ref 12.0–16.0)
PLATELETS: 186 10*3/uL (ref 150–399)
WBC: 4.4 10*3/mL

## 2015-02-07 ENCOUNTER — Ambulatory Visit (INDEPENDENT_AMBULATORY_CARE_PROVIDER_SITE_OTHER): Payer: Medicare Other | Admitting: Nurse Practitioner

## 2015-02-07 ENCOUNTER — Encounter: Payer: Self-pay | Admitting: Nurse Practitioner

## 2015-02-07 VITALS — BP 124/82 | HR 72 | Wt 121.0 lb

## 2015-02-07 DIAGNOSIS — G8104 Flaccid hemiplegia affecting left nondominant side: Secondary | ICD-10-CM | POA: Diagnosis not present

## 2015-02-07 DIAGNOSIS — G40909 Epilepsy, unspecified, not intractable, without status epilepticus: Secondary | ICD-10-CM | POA: Diagnosis not present

## 2015-02-07 DIAGNOSIS — I639 Cerebral infarction, unspecified: Secondary | ICD-10-CM | POA: Diagnosis not present

## 2015-02-07 DIAGNOSIS — F0150 Vascular dementia without behavioral disturbance: Secondary | ICD-10-CM | POA: Diagnosis not present

## 2015-02-07 NOTE — Progress Notes (Signed)
I have reviewed and agreed above plan. 

## 2015-02-07 NOTE — Progress Notes (Signed)
[facility]  PATIENT: [name] DOB: 28-Jul-1923   REASON FOR VISIT: Follow-up for vascular dementia and history of CVA , seizure disorder, hemiparesis left and gait abnormality HISTORY FROM: Daughter    HISTORY OF PRESENT ILLNESS:[name], 81 year old female returns for followup. She has a history of large right MCA stroke which occurred in August 2010 after an aortic valve replacement thought cardioembolic due to atrial fibrillation. She is on warfarin without further stroke or TIA symptoms She has a vascular dementia and is currently on Aricept without side effects. She also has seizure disorder with last event occurring on 06/27/2014. She is currently on Keppra without side effects. She is residing in a skilled facility. She is nonambulatory. She is dependent for all activities of daily living. Her appetite is good and she is sleeping well, there have been no behavioral problems. She has no new neurologic complaints per her daughter who accompanies her today .   REVIEW OF SYSTEMS: Full 14 system review of systems performed and notable only for those listed, all others are neg:  Constitutional: neg  Cardiovascular: neg Ear/Nose/Throat: neg  Skin: neg Eyes: neg Respiratory: neg Gastroitestinal: Incontinence of bladder Hematology/Lymphatic: neg  Endocrine: neg Musculoskeletal: Joint pain walking difficulty Allergy/Immunology: neg Neurological: Memory loss, speech difficulty, left-sided weakness Psychiatric: Depression Sleep ALLERGIES: Allergies  Allergen Reactions  . Advil [Ibuprofen] Other (See Comments)    "think it just doesn't work for her"  . Alprazolam Other (See Comments)    "don't remember what happens"  . Relafen [Nabumetone] Other (See Comments)    "don't remember what happens"  . Sulfa Antibiotics Other (See Comments)    "don't remember what happens"  . Toprol Xl [Metoprolol Succinate] Other (See Comments)    ; "don't remember what  happens"    HOME MEDICATIONS: Outpatient Prescriptions Prior to Visit  Medication Sig Dispense Refill  . acetaminophen (TYLENOL) 325 MG tablet Take 650 mg by mouth 2 (two) times daily.    Marland Kitchen artificial tears (LACRILUBE) OINT ophthalmic ointment Place 1 application into both eyes at bedtime.    Marland Kitchen aspirin 81 MG tablet Take 81 mg by mouth daily.    Marland Kitchen atorvastatin (LIPITOR) 10 MG tablet Take 10 mg by mouth daily.    . Cholecalciferol 2000 UNITS CAPS Take by mouth.    . citalopram (CELEXA) 10 MG tablet Take 20 mg by mouth daily.     Marland Kitchen donepezil (ARICEPT) 10 MG tablet Take 10 mg by mouth at bedtime.    . furosemide (LASIX) 20 MG tablet Take 20 mg by mouth 2 (two) times daily.    Marland Kitchen ipratropium-albuterol (DUONEB) 0.5-2.5 (3) MG/3ML SOLN Take 3 mLs by nebulization every 6 (six) hours as needed. For shortness of breath    . levETIRAcetam (KEPPRA) 250 MG tablet Take 500 mg by mouth 2 (two) times daily.     Marland Kitchen levothyroxine (SYNTHROID, LEVOTHROID) 50 MCG tablet Take 50 mcg by mouth daily before breakfast.    . losartan (COZAAR) 25 MG tablet Take 50 mg by mouth daily.     Marland Kitchen omeprazole (PRILOSEC) 20 MG capsule Take 20 mg by mouth daily.    Ethelda Chick (OYSTER CALCIUM) 500 MG TABS Take 500 mg of elemental calcium by mouth 2 (two) times daily.     Marland Kitchen senna (SENOKOT) 8.6 MG TABS Take 2 tablets by mouth at bedtime.    Marland Kitchen tiotropium (SPIRIVA) 18 MCG inhalation capsule Place 18 mcg into inhaler and inhale daily.    Marland Kitchen  traMADol (ULTRAM) 50 MG tablet Take one tablet by mouth twice daily for OA/Pain 60 tablet 5  . warfarin (COUMADIN) 4 MG tablet Take 4 mg by mouth daily. Tues/Wed/Thurs    . warfarin (COUMADIN) 2.5 MG tablet Take 3.5 mg by mouth. Sun/Mon/Fri     No facility-administered medications prior to visit.    PAST MEDICAL HISTORY: Past Medical History  Diagnosis Date  . Hypertension   . Hyperlipidemia   . Hypothyroidism   . Atrial fibrillation   . Pacemaker   . Shortness of breath   . CHF  (congestive heart failure) 05/27/2006; 05/30/2006; 11/06/2008  . Vascular dementia     "due to CVA 06/08/2009"  . Stroke 03/06/2001; 05/30/2009    "light stroke"  . Stroke 06/08/2009    "probable small TIA"  . Stroke 01/06/2002  . Angina 12/22/2003  . Ventilator dependent 06/08/2009    respiratory failure  . H/O: rheumatic fever     "childhood"  . Coronary artery disease   . Left atrial enlargement   . H/O cardiomegaly     diastolic  . Pulmonary hypertension   . Anemia     "iron transfusions"  . Blood transfusion   . History of lower GI bleeding     "multiple; required blood transfusions"  . COPD (chronic obstructive pulmonary disease)   . GERD (gastroesophageal reflux disease)   . H/O hiatal hernia   . H/O chronic bronchitis   . Asthma   . Arthritis   . Anxiety   . Depression   . Dysphagia as late effect of stroke   . Autoimmune hepatitis 07/12/1999  . Bilateral renal cysts     "2 large on right off the lower pole"  . Chronic renal insufficiency   . Pleural effusion     "moderate right & small left"  . Bacterial pneumonia 11/06/2008; 10/23/2009; 03/30/2010  . Avascular necrosis of femoral head     right  . A-fib 01/04/2013  . Hx of atrioventricular node ablation 09/27/2014    PAST SURGICAL HISTORY: Past Surgical History  Procedure Laterality Date  . Insert / replace / remove pacemaker  09/11/2000  . Av node ablation  "06/06/2005"  . Percutaneous tracheostomy  06/15/2009  . Electroencephalogram  01/28/2002; 11/26/2005; 10/23/2009  . Cardiac catheterization  06/14/1999; 12/26/2003; 05/30/2009  . Transcranial doppler & carotid ultrasound  11/28/2005  . Cardiac valve replacement  08/20/1999    "mitral valve replacement; St. Jude"  . Cardiac valve replacement  06/05/2009     "redo sternotomy & aortic valve replacement; tissue valve"  . Cauterized cecal av malformation    . Abdominal hysterectomy  1960's  . Dental implants removed  03/04/2001    lower  . Knee arthroscopy  12/26/2000     left  . Knee arthroscopy  07/29/2001    right  . Pacemaker generator change N/A 05/24/2013    Procedure: PACEMAKER GENERATOR CHANGE;  Surgeon: Thurmon Fair, MD;  Location: [facility] CATH [facility];  Service: Cardiovascular;  Laterality: N/A;    FAMILY HISTORY: History reviewed. No pertinent family history.  SOCIAL HISTORY: History   Social History  . Marital Status: Widowed    Spouse Name: N/A  . Number of Children: N/A  . Years of Education: N/A   Occupational History  . Not on file.   Social History Main Topics  . Smoking status: Former Smoker -- 1.00 packs/day for 30 years    Types: Cigarettes    Quit date: 10/14/1970  . Smokeless tobacco: Never Used  .  Alcohol Use: No  . Drug Use: No  . Sexual Activity: No   Other Topics Concern  . Not on file   Social History Narrative   Resides [facility] since 2010 Stroke.      PHYSICAL EXAM  Filed Vitals:   02/07/15 0820  BP: 124/82  Pulse: 72  Weight: 121 lb (54.885 kg)  SpO2: 2%   Body mass index is 22.87 kg/(m^2). Generalized: Well developed, in no acute distress O2 dependent Head: normocephalic and atraumatic,. Oropharynx benign  Musculoskeletal: No deformity   Neurological examination   Mentation: Alert not oriented to time, place, Follows 1 step commands,little speech . Unable to perform MMSE Cranial nerve II-XII: Pupils irregular but reactive to light extraocular movements were full, visual field were full on confrontational test. Left facial droop Hearing was intact to finger rubbing bilaterally. Uvula tongue midline. head turning and shoulder shrug were normal and symmetric.Tongue protrusion into cheek strength was normal. Motor: Moderate left-sided weakness in the upper and lower extremity increased tone on the left side with spasticity, tolerates passive movements, good strength on the right. Sensory: Withdraws to pain  Coordination: Unable to perform Reflexes: 2+ and symmetric on the right, brisker on the  left, plantar responses were flexor bilaterally. Gait and Station: Nonambulatory , in a wheelchair DIAGNOSTIC DATA (LABS, IMAGING, TESTING) - I reviewed patient records, labs, notes, testing and imaging myself where available.  Lab Results  Component Value Date   WBC 5.6 04/26/2014   HGB 10.7* 04/26/2014   HCT 36 04/26/2014   MCV 92.6 05/28/2013   PLT 187 04/26/2014      Component Value Date/Time   NA 138 04/26/2014   NA 141 05/21/2013 1100   K 3.9 04/26/2014   CL 102 05/21/2013 1100   CO2 32 05/21/2013 1100   GLUCOSE 87 05/21/2013 1100   BUN 27* 04/26/2014   BUN 28* 05/21/2013 1100   CREATININE 1.37* 05/21/2013 1100   CALCIUM 10.3 05/21/2013 1100   PROT 8.8* 06/09/2012 0558   ALBUMIN 3.8 06/09/2012 0558   AST 24 01/17/2014   ALT 11 01/17/2014   ALKPHOS 87 06/09/2012 0558   BILITOT 1.0 06/09/2012 0558   GFRNONAA 34* 05/21/2013 1100   GFRAA 39* 05/21/2013 1100    ASSESSMENT AND PLAN  81 y.o. year old female  has a past medical history of Hypertension; Hyperlipidemia;Atrial fibrillation; Pacemaker; Vascular dementia;  Stroke (06/08/2009);  COPD (chronic obstructive pulmonary disease); Dysphagia as late effect of stroke; seizure disorder ,gait abnormality and left-sided hemiparesis here to follow-up.   Continue Aricept at 10 mg daily Continue warfarin for secondary stroke prevention Continue Keppra for seizure disorder Follow-up yearly and when necessary Nilda RiggsNancy Carolyn Martin, [facility] LLCGNP, [facility]BC, APRN  [facility] [address], [address] [place], [place] [address] [phone]

## 2015-02-07 NOTE — Patient Instructions (Signed)
PER SKILLED FACILITY

## 2015-03-01 ENCOUNTER — Non-Acute Institutional Stay (SKILLED_NURSING_FACILITY): Payer: Medicare Other | Admitting: Internal Medicine

## 2015-03-01 DIAGNOSIS — Z950 Presence of cardiac pacemaker: Secondary | ICD-10-CM | POA: Diagnosis not present

## 2015-03-01 DIAGNOSIS — K219 Gastro-esophageal reflux disease without esophagitis: Secondary | ICD-10-CM | POA: Diagnosis not present

## 2015-03-01 DIAGNOSIS — M150 Primary generalized (osteo)arthritis: Secondary | ICD-10-CM

## 2015-03-01 DIAGNOSIS — M159 Polyosteoarthritis, unspecified: Secondary | ICD-10-CM

## 2015-03-01 NOTE — Progress Notes (Signed)
Patient ID: Savannah LyonsMary L [name], female   DOB: 05-26-1923, 81 y.o.   MRN: [id number]    Facility[facility]   Chief complaint- medical management of chronic issues  Allergies reviewed- sulfa, xanax, advil, toprol, novocain  Code status- full code  HPI 81 y/o female patient is seen for routine visit. she denies any concerns today. She is lying in her bed in no distress. She can make her needs known. Is on continuous oxygen. Has lost 1 lbs since last visit.  Review of Systems  : limited Constitutional: Negative for fever, chills and diaphoresis.   HENT: Negative for congestion.     Respiratory: Negative for cough, shortness of breath and wheezing. on o2 Cardiovascular: Negative for chest pain and palpitations.   Gastrointestinal: Negative for heartburn, nausea and vomiting.   Genitourinary: Urinary incontinence.  Musculoskeletal: Negative for falls. Left sided weakness.   Skin: Negative for rash.   Neurological: Negative for dizziness and headaches.   Past medical history reviewed  Medication reviewed. [facility]  Physical exam BP 133/78 mmHg  Pulse 72  Temp(Src) 97.6 F (36.4 C)  Resp 18  Wt 119 lb 1.6 oz (54.023 kg)  SpO2 98%  Constitutional: Thin, frail, in no distress, comfortable Neck: Neck supple. No tracheal deviation present. No thyromegaly present.   Cardiovascular: Normal rate, regular rhythm and intact distal pulses.  pacemaker in right upper chest. Scar from previous sternotomy. Systolic ejection murmur present Respiratory: Effort normal and breath sounds normal. No respiratory distress. She has no wheezes. On o2   GI: Soft. Bowel sounds are normal. No epigastric tenderness. She exhibits no distension. There is no tenderness.  Musculoskeletal: She exhibits no edema. 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 Mild left hand contracture. Good distal pulses. Left knee and ankle: no swelling or redness noted, normal skin  temperature Neurological: She is alert and can make her needs known, baseline mild confusion  Skin: Skin is warm and dry.   Labs reviewed: 04-26-14 wbc 5.6, hb 10.7, hct 35.5, plt 187, na 138, k 3.9, bun 20, cr 1.3, glu 82, ca 9.9 06-23-14 wbc 5, hb 10.7, hct 35.5, plt 201 09-02-14 wbc 5.4, hb 10.4, hct 33.6, plt 177, na 144, k 3.9, bun 22, cr 1.3, glu 83, ca 10.2, t.chol 142, ldl 83, hdl 48, tg 54 11-16-14 wbc 4.8, hb 10.6, hct 31.8, plt 198, na 139, k 3.8, bun 22, cr 1.27, glu 82, ca 9.6, t.chol 126, tg 80, ldl 62, hdl 48, tsh 2.164 [id number] wbc 4.4, hb 10.1, hct 30.4, mcv 91.3, plt 186, t.chol 131, tg 66, ldl 64, hdl 54  Assessment/Plan  gerd Continue prilosec 20 mg every other day and zantac 150 mg daily  OA continue scheduled tylenol with tramadol 50 mg bid.   Cardiac pacemaker Since 2001, uptodate on follow up with cardiology. Stable   Oneal GroutMAHIMA Tilda Samudio, MD  [facility] [phone] (Monday-Friday 8 am - 5 pm) [phone] (afterhours)

## 2015-03-22 ENCOUNTER — Non-Acute Institutional Stay (SKILLED_NURSING_FACILITY): Payer: Medicare Other | Admitting: Internal Medicine

## 2015-03-22 DIAGNOSIS — I130 Hypertensive heart and chronic kidney disease with heart failure and stage 1 through stage 4 chronic kidney disease, or unspecified chronic kidney disease: Secondary | ICD-10-CM

## 2015-03-22 DIAGNOSIS — N183 Chronic kidney disease, stage 3 unspecified: Secondary | ICD-10-CM

## 2015-03-22 DIAGNOSIS — I5022 Chronic systolic (congestive) heart failure: Secondary | ICD-10-CM | POA: Diagnosis not present

## 2015-03-22 NOTE — Progress Notes (Signed)
Patient ID: Geoffery LyonsMary L Amini, female   DOB: Jul 14, 1923, 81 y.o.   MRN: [id number]    [facility]   Chief complaint- medical management of chronic issues  Allergies reviewed- sulfa, xanax, advil, toprol, novocain  Code status- full code  HPI 81 y/o female patient is seen for routine visit. She has been at her baseline. Her bowel movement is regulated with colace. She denies dizziness or lightheadedness. Weight is stable.   Review of Systems Constitutional: Negative for fever, chills and diaphoresis.   HENT: Negative for congestion.     Respiratory: Negative for cough, shortness of breath and wheezing. on o2 Cardiovascular: Negative for chest pain and palpitations.   Gastrointestinal: Negative for heartburn, nausea and vomiting.   Genitourinary: Urinary incontinence.  Musculoskeletal: Negative for falls. Left sided weakness.   Skin: Negative for rash.   Neurological: Negative for dizziness and headaches.   Past medical history reviewed  Medication reviewed. See [facility]  Physical exam BP 140/78 mmHg  Pulse 75  Temp(Src) 97 F (36.1 C)  Resp 16  Wt 117 lb 12.8 oz (53.434 kg)  SpO2 98%  Constitutional: Thin, frail, in no distress, comfortable Neck: Neck supple. No tracheal deviation present. No thyromegaly present.   Cardiovascular: Normal rate, regular rhythm and intact distal pulses.  pacemaker in right upper chest. Scar from previous sternotomy. Systolic ejection murmur present Respiratory: Effort normal and breath sounds normal. No respiratory distress. She has no wheezes. On o2   GI: Soft. Bowel sounds are normal. No epigastric tenderness. She exhibits no distension. There is no tenderness.  Musculoskeletal: She exhibits no edema. 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 Mild left hand contracture. Good distal pulses. Left knee and ankle: no swelling or redness noted, normal skin temperature Neurological: She is  alert and can make her needs known, baseline mild confusion  Skin: Skin is warm and dry.   Labs reviewed: 04-26-14 wbc 5.6, hb 10.7, hct 35.5, plt 187, na 138, k 3.9, bun 20, cr 1.3, glu 82, ca 9.9 06-23-14 wbc 5, hb 10.7, hct 35.5, plt 201 09-02-14 wbc 5.4, hb 10.4, hct 33.6, plt 177, na 144, k 3.9, bun 22, cr 1.3, glu 83, ca 10.2, t.chol 142, ldl 83, hdl 48, tg 54 11-16-14 wbc 4.8, hb 10.6, hct 31.8, plt 198, na 139, k 3.8, bun 22, cr 1.27, glu 82, ca 9.6, t.chol 126, tg 80, ldl 62, hdl 48, tsh 2.164 [id number] wbc 4.4, hb 10.1, hct 30.4, mcv 91.3, plt 186, t.chol 131, tg 66, ldl 64, hdl 54  Assessment/Plan  Hypertensive heart and renal disease Stable bp reading, tolerating cozaar 50 mg daily well. Continue lsix, monitor bp  ckd stage 3 Tolerating cozaar well, monitor renal function with her on lasix  chf euvolemic on exam. Continue o2, lasix and cozaar for now.    Oneal GroutMAHIMA Mervil Wacker, MD  [facility] [phone] (Monday-Friday 8 am - 5 pm) [phone] (afterhours)

## 2015-03-29 ENCOUNTER — Ambulatory Visit (INDEPENDENT_AMBULATORY_CARE_PROVIDER_SITE_OTHER): Payer: Medicare Other | Admitting: *Deleted

## 2015-03-29 DIAGNOSIS — I442 Atrioventricular block, complete: Secondary | ICD-10-CM

## 2015-03-29 NOTE — Progress Notes (Signed)
Remote pacemaker transmission.   

## 2015-04-02 LAB — CUP PACEART REMOTE DEVICE CHECK
Brady Statistic RV Percent Paced: 94 %
Lead Channel Impedance Value: 690 Ohm
Lead Channel Pacing Threshold Amplitude: 0.75 V
Lead Channel Setting Sensing Sensitivity: 2 mV
MDC IDC MSMT BATTERY REMAINING LONGEVITY: 142 mo
MDC IDC MSMT BATTERY REMAINING PERCENTAGE: 95.5 %
MDC IDC MSMT BATTERY VOLTAGE: 3.01 V
MDC IDC MSMT LEADCHNL RV PACING THRESHOLD PULSEWIDTH: 0.4 ms
MDC IDC MSMT LEADCHNL RV SENSING INTR AMPL: 12 mV
MDC IDC SESS DTM: 20160615060018
MDC IDC SET LEADCHNL RV PACING AMPLITUDE: 1 V
MDC IDC SET LEADCHNL RV PACING PULSEWIDTH: 0.4 ms
Pulse Gen Serial Number: 7529661

## 2015-04-06 ENCOUNTER — Encounter: Payer: Self-pay | Admitting: Cardiology

## 2015-04-10 ENCOUNTER — Other Ambulatory Visit: Payer: Self-pay

## 2015-04-10 LAB — TSH: TSH: 1.34 u[IU]/mL (ref <=5.90)

## 2015-04-12 ENCOUNTER — Encounter: Payer: Self-pay | Admitting: Cardiovascular Disease

## 2015-04-27 ENCOUNTER — Non-Acute Institutional Stay (SKILLED_NURSING_FACILITY): Payer: Medicare Other | Admitting: Internal Medicine

## 2015-04-27 DIAGNOSIS — I1310 Hypertensive heart and chronic kidney disease without heart failure, with stage 1 through stage 4 chronic kidney disease, or unspecified chronic kidney disease: Secondary | ICD-10-CM | POA: Insufficient documentation

## 2015-04-27 DIAGNOSIS — I10 Essential (primary) hypertension: Secondary | ICD-10-CM

## 2015-04-27 DIAGNOSIS — J9611 Chronic respiratory failure with hypoxia: Secondary | ICD-10-CM | POA: Diagnosis not present

## 2015-04-27 DIAGNOSIS — E890 Postprocedural hypothyroidism: Secondary | ICD-10-CM

## 2015-04-27 DIAGNOSIS — G40909 Epilepsy, unspecified, not intractable, without status epilepticus: Secondary | ICD-10-CM

## 2015-04-27 NOTE — Progress Notes (Signed)
Patient ID: [name], female   DOB: Oct 22, 1922, 81 y.o.   MRN: [id number]     [facility]   Chief complaint- medical management of chronic issues  Allergies reviewed- sulfa, xanax, advil, toprol, novocain  Code status- full code  HPI 81 y/o female patient is seen for routine visit. she denies any concerns today. She is lying in her bed in no distress. She can make her needs known. Is on continuous oxygen. Has ongoing memory decline. No recent seizure reported. Breathing stable on o2. No concerns from staff.  Review of Systems: pt unable to participate with dementia  Past medical history reviewed  Medication reviewed. See [facility]   Medication List       This list is accurate as of: 04/27/15  2:44 PM.  Always use your most recent med list.               acetaminophen 325 MG tablet  Commonly known as:  TYLENOL  Take 650 mg by mouth 2 (two) times daily.     artificial tears Oint ophthalmic ointment  Place 1 application into both eyes at bedtime.     aspirin 81 MG tablet  Take 81 mg by mouth daily.     atorvastatin 10 MG tablet  Commonly known as:  LIPITOR  Take 10 mg by mouth daily.     BEN GAY GREASELESS 10-15 % greaseless cream  Apply topically at bedtime. To L foot and ankle.     Cholecalciferol 2000 UNITS Caps  Take by mouth.     citalopram 10 MG tablet  Commonly known as:  CELEXA  Take 20 mg by mouth daily.     donepezil 10 MG tablet  Commonly known as:  ARICEPT  Take 10 mg by mouth at bedtime.     furosemide 20 MG tablet  Commonly known as:  LASIX  Take 20 mg by mouth 2 (two) times daily.     ipratropium-albuterol 0.5-2.5 (3) MG/3ML Soln  Commonly known as:  DUONEB  Take 3 mLs by nebulization every 6 (six) hours as needed. For shortness of breath     levETIRAcetam 250 MG tablet  Commonly known as:  KEPPRA  Take 500 mg by mouth 2 (two) times daily.     levothyroxine 50 MCG tablet  Commonly known as:   SYNTHROID, LEVOTHROID  Take 50 mcg by mouth daily before breakfast.     losartan 25 MG tablet  Commonly known as:  COZAAR  Take 50 mg by mouth daily.     omeprazole 20 MG capsule  Commonly known as:  PRILOSEC  Take 20 mg by mouth daily.     oyster calcium 500 MG Tabs tablet  Take 500 mg of elemental calcium by mouth 2 (two) times daily.     ranitidine 150 MG capsule  Commonly known as:  ZANTAC  Take 150 mg by mouth 2 (two) times daily. Taking BID for 7 days then will go to once daily.     senna 8.6 MG Tabs tablet  Commonly known as:  SENOKOT  Take 2 tablets by mouth at bedtime.     tiotropium 18 MCG inhalation capsule  Commonly known as:  SPIRIVA  Place 18 mcg into inhaler and inhale daily.     traMADol 50 MG tablet  Commonly known as:  ULTRAM  Take one tablet by mouth twice daily for OA/Pain     warfarin 4 MG tablet  Commonly known  as:  COUMADIN  Take 4 mg by mouth daily. Tues/Wed/Thurs        Physical exam BP 119/78 mmHg  Pulse 72  Temp(Src) 97 F (36.1 C)  Resp 18  SpO2 98%  Constitutional: Thin, frail, in no distress, comfortable Neck: Neck supple. No tracheal deviation present. No thyromegaly present.   Cardiovascular: Normal rate, regular rhythm and intact distal pulses.  pacemaker in right upper chest. Scar from previous sternotomy. Systolic ejection murmur present Respiratory: Effort normal and breath sounds normal. No respiratory distress. She has no wheezes. On o2   GI: Soft. Bowel sounds are normal. No epigastric tenderness. She exhibits no distension. There is no tenderness.  Musculoskeletal: She exhibits no edema. 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 Mild left hand contracture. Good distal pulses. Left knee and ankle: no swelling or redness noted, normal skin temperature Neurological: She is alert and can make her needs known, baseline confusion  Skin: Skin is warm and dry.   Labs reviewed: 04-26-14 wbc 5.6, hb 10.7, hct 35.5,  plt 187, na 138, k 3.9, bun 20, cr 1.3, glu 82, ca 9.9 06-23-14 wbc 5, hb 10.7, hct 35.5, plt 201 09-02-14 wbc 5.4, hb 10.4, hct 33.6, plt 177, na 144, k 3.9, bun 22, cr 1.3, glu 83, ca 10.2, t.chol 142, ldl 83, hdl 48, tg 54 11-16-14 wbc 4.8, hb 10.6, hct 31.8, plt 198, na 139, k 3.8, bun 22, cr 1.27, glu 82, ca 9.6, t.chol 126, tg 80, ldl 62, hdl 48, tsh 2.164 9-62-95 wbc 4.4, hb 10.1, hct 30.4, mcv 91.3, plt 186, t.chol 131, tg 66, ldl 64, hdl 54 04-07-15 tsh 1.342, vit d 58  Assessment/Plan  Hypothyroidism tsh normal. Continue levothyroxine 50 mcg daily, check tsh q6 months  HTN bp stable, continue cozaar 50 mg daily  Seizure disorder Remains seizure free. Continue keppra 500 mg bid  Copd Breathing stable, continue o2 by nasal canula with duoneb and monitor   Oneal Grout, MD  [facility] [phone] (Monday-Friday 8 am - 5 pm) [phone] (afterhours)

## 2015-05-16 LAB — HM DIABETES FOOT EXAM

## 2015-05-29 ENCOUNTER — Encounter: Payer: Self-pay | Admitting: Internal Medicine

## 2015-05-29 ENCOUNTER — Non-Acute Institutional Stay (SKILLED_NURSING_FACILITY): Payer: Medicare Other | Admitting: Internal Medicine

## 2015-05-29 DIAGNOSIS — H04123 Dry eye syndrome of bilateral lacrimal glands: Secondary | ICD-10-CM | POA: Diagnosis not present

## 2015-05-29 DIAGNOSIS — N183 Chronic kidney disease, stage 3 unspecified: Secondary | ICD-10-CM

## 2015-05-29 DIAGNOSIS — K219 Gastro-esophageal reflux disease without esophagitis: Secondary | ICD-10-CM

## 2015-05-29 NOTE — Progress Notes (Signed)
Patient ID: [name], female   DOB: 1922-11-11, 81 y.o.   MRN: [id number]    [facility]  Code Status: Full Code    Chief Complaint  Patient presents with  . Medical Management of Chronic Issues    Routine Visit     Allergies  Allergen Reactions  . Advil [Ibuprofen] Other (See Comments)    "think it just doesn't work for her"  . Alprazolam Other (See Comments)    "don't remember what happens"  . Relafen [Nabumetone] Other (See Comments)    "don't remember what happens"  . Sulfa Antibiotics Other (See Comments)    "don't remember what happens"  . Toprol Xl [Metoprolol Succinate] Other (See Comments)    ; "don't remember what happens"    HPI 81 y/o female patient is seen for routine visit. She has been at her baseline, is on o2, denies any concerns. unable to obtain hpi and ros. No new concern from staff. Has ongoing memory decline.   Review of Systems: pt unable to participate with dementia  Past medical history reviewed  Medication reviewed. See [facility]   Medication List       This list is accurate as of: 05/29/15 11:59 PM.  Always use your most recent med list.               acetaminophen 325 MG tablet  Commonly known as:  TYLENOL  Take 650 mg by mouth 2 (two) times daily.     artificial tears Oint ophthalmic ointment  Place 1 application into both eyes at bedtime.     aspirin 81 MG tablet  Take 81 mg by mouth daily.     atorvastatin 10 MG tablet  Commonly known as:  LIPITOR  Take 10 mg by mouth daily.     Cholecalciferol 2000 UNITS Caps  Take by mouth.     citalopram 10 MG tablet  Commonly known as:  CELEXA  Take 20 mg by mouth daily.     docusate sodium 100 MG capsule  Commonly known as:  COLACE  Take 100 mg by mouth daily.     donepezil 10 MG tablet  Commonly known as:  ARICEPT  Take 10 mg by mouth at bedtime.     furosemide 20 MG tablet  Commonly known as:  LASIX  Take 20 mg by mouth 2 (two) times daily.     ipratropium-albuterol 0.5-2.5 (3) MG/3ML Soln  Commonly known as:  DUONEB  Take 3 mLs by nebulization every 6 (six) hours as needed. For shortness of breath     levETIRAcetam 250 MG tablet  Commonly known as:  KEPPRA  Take 500 mg by mouth 2 (two) times daily.     levothyroxine 50 MCG tablet  Commonly known as:  SYNTHROID, LEVOTHROID  Take 50 mcg by mouth daily before breakfast.     losartan 25 MG tablet  Commonly known as:  COZAAR  Take 50 mg by mouth daily.     omeprazole 20 MG capsule  Commonly known as:  PRILOSEC  Take 20 mg by mouth daily.     oyster calcium 500 MG Tabs tablet  Take 500 mg of elemental calcium by mouth 2 (two) times daily.     ranitidine 150 MG capsule  Commonly known as:  ZANTAC  Take 150 mg by mouth 2 (two) times daily. Taking BID for 7 days then will go to once daily.     senna 8.6 MG Tabs tablet  Commonly  known as:  SENOKOT  Take 2 tablets by mouth at bedtime.     tiotropium 18 MCG inhalation capsule  Commonly known as:  SPIRIVA  Place 18 mcg into inhaler and inhale daily.     traMADol 50 MG tablet  Commonly known as:  ULTRAM  Take one tablet by mouth twice daily for OA/Pain     warfarin 4 MG tablet  Commonly known as:  COUMADIN  4 mg. T/TH/Sat, 3.5 mg on M/W/F/Sun        Physical exam BP 107/67 mmHg  Pulse 70  Temp(Src) 98.1 F (36.7 C) (Oral)  Resp 18  Ht 5' (1.524 m)  Wt 117 lb (53.071 kg)  BMI 22.85 kg/m2  SpO2 99%  Constitutional: Thin, frail, in no distress, comfortable Neck: Neck supple. No tracheal deviation present. No thyromegaly present.   Cardiovascular: Normal rate, regular rhythm and intact distal pulses.  pacemaker in right upper chest. Scar from previous sternotomy. Systolic ejection murmur present Respiratory: Effort normal and breath sounds normal. No respiratory distress. She has no wheezes. On o2   GI: Soft. Bowel sounds are normal. No epigastric tenderness. She exhibits no distension. There is no tenderness.    Musculoskeletal: She exhibits no edema. 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 Mild left hand contracture. Good distal pulses. Left knee and ankle: no swelling or redness noted, normal skin temperature Neurological: She is alert and can make her needs known, baseline confusion  Skin: Skin is warm and dry.   Labs reviewed: 04-26-14 wbc 5.6, hb 10.7, hct 35.5, plt 187, na 138, k 3.9, bun 20, cr 1.3, glu 82, ca 9.9 06-23-14 wbc 5, hb 10.7, hct 35.5, plt 201 09-02-14 wbc 5.4, hb 10.4, hct 33.6, plt 177, na 144, k 3.9, bun 22, cr 1.3, glu 83, ca 10.2, t.chol 142, ldl 83, hdl 48, tg 54 11-16-14 wbc 4.8, hb 10.6, hct 31.8, plt 198, na 139, k 3.8, bun 22, cr 1.27, glu 82, ca 9.6, t.chol 126, tg 80, ldl 62, hdl 48, tsh 2.164 1-61-09 wbc 4.4, hb 10.1, hct 30.4, mcv 91.3, plt 186, t.chol 131, tg 66, ldl 64, hdl 54 04-07-15 tsh 1.342, vit d 58  Assessment/Plan  Dry eye syndrome Continue lacrilube for now and monitor  ckd stage 3 Tolerating cozaar well, monitor renal function with her on lasix  gerd Continue prilosec and zantac 150 mg daily   Oneal Grout, MD  [facility] [phone] (Monday-Friday 8 am - 5 pm) [phone] (afterhours)

## 2015-06-25 DIAGNOSIS — H04129 Dry eye syndrome of unspecified lacrimal gland: Secondary | ICD-10-CM | POA: Insufficient documentation

## 2015-06-26 LAB — BASIC METABOLIC PANEL
BUN: 33 mg/dL — AB (ref 4–21)
Creatinine: 1.6 mg/dL — AB (ref 0.5–1.1)
Glucose: 79 mg/dL
POTASSIUM: 4.3 mmol/L (ref 3.4–5.3)
SODIUM: 143 mmol/L (ref 137–147)

## 2015-06-26 LAB — HEPATIC FUNCTION PANEL
ALT: 11 U/L (ref 7–35)
AST: 27 U/L (ref 13–35)
Alkaline Phosphatase: 72 U/L (ref 25–125)
Bilirubin, Total: 0.3 mg/dL

## 2015-06-26 LAB — LIPID PANEL
CHOLESTEROL: 151 mg/dL (ref 0–200)
HDL: 56 mg/dL (ref 35–70)
LDL Cholesterol: 86 mg/dL
Triglycerides: 43 mg/dL (ref 40–160)

## 2015-06-29 ENCOUNTER — Encounter: Payer: Self-pay | Admitting: Cardiovascular Disease

## 2015-06-29 ENCOUNTER — Ambulatory Visit (INDEPENDENT_AMBULATORY_CARE_PROVIDER_SITE_OTHER): Payer: Medicare Other | Admitting: *Deleted

## 2015-06-29 ENCOUNTER — Non-Acute Institutional Stay (SKILLED_NURSING_FACILITY): Payer: Medicare Other | Admitting: Internal Medicine

## 2015-06-29 DIAGNOSIS — J9611 Chronic respiratory failure with hypoxia: Secondary | ICD-10-CM

## 2015-06-29 DIAGNOSIS — I25119 Atherosclerotic heart disease of native coronary artery with unspecified angina pectoris: Secondary | ICD-10-CM

## 2015-06-29 DIAGNOSIS — I442 Atrioventricular block, complete: Secondary | ICD-10-CM | POA: Diagnosis not present

## 2015-06-29 DIAGNOSIS — J449 Chronic obstructive pulmonary disease, unspecified: Secondary | ICD-10-CM | POA: Diagnosis not present

## 2015-06-29 DIAGNOSIS — E785 Hyperlipidemia, unspecified: Secondary | ICD-10-CM | POA: Diagnosis not present

## 2015-06-29 DIAGNOSIS — D638 Anemia in other chronic diseases classified elsewhere: Secondary | ICD-10-CM | POA: Diagnosis not present

## 2015-06-29 NOTE — Progress Notes (Signed)
Patient ID: TIYAH ZELENAK, female   DOB: 1923/04/17, 81 y.o.   MRN: [id number]      [facility]  Code Status: Full Code    Chief Complaint  Patient presents with  . Medical Management of Chronic Issues    Allergies  Allergen Reactions  . Advil [Ibuprofen] Other (See Comments)    "think it just doesn't work for her"  . Alprazolam Other (See Comments)    "don't remember what happens"  . Relafen [Nabumetone] Other (See Comments)    "don't remember what happens"  . Sulfa Antibiotics Other (See Comments)    "don't remember what happens"  . Toprol Xl [Metoprolol Succinate] Other (See Comments)    25mg ; "don't remember what happens"    HPI 81 y/o female patient is seen for routine visit. Her breathing is stable with the o2. She is out of bed and in gerichair today. No recent chf exacerbation reported. Her pain is under control and bp reading are stable. She has been at her baseline, denies any concerns. unable to obtain hpi and ros. No new concern from staff. Has ongoing memory decline. Her lipid panel on review are normal.  Review of Systems: pt unable to participate with dementia  Past medical history reviewed  Medication reviewed. See [facility]   Medication List       This list is accurate as of: 06/29/15  4:12 PM.  Always use your most recent med list.               acetaminophen 325 MG tablet  Commonly known as:  TYLENOL  Take 650 mg by mouth 2 (two) times daily.     artificial tears Oint ophthalmic ointment  Place 1 application into both eyes at bedtime.     aspirin 81 MG tablet  Take 81 mg by mouth daily.     atorvastatin 10 MG tablet  Commonly known as:  LIPITOR  Take 10 mg by mouth daily.     Cholecalciferol 2000 UNITS Caps  Take by mouth.     citalopram 10 MG tablet  Commonly known as:  CELEXA  Take 20 mg by mouth daily.     docusate sodium 100 MG capsule  Commonly known as:  COLACE  Take 100 mg by mouth daily.     donepezil 10 MG  tablet  Commonly known as:  ARICEPT  Take 10 mg by mouth at bedtime.     furosemide 20 MG tablet  Commonly known as:  LASIX  Take 20 mg by mouth 2 (two) times daily.     ipratropium-albuterol 0.5-2.5 (3) MG/3ML Soln  Commonly known as:  DUONEB  Take 3 mLs by nebulization every 6 (six) hours as needed. For shortness of breath     levETIRAcetam 250 MG tablet  Commonly known as:  KEPPRA  Take 500 mg by mouth 2 (two) times daily.     levothyroxine 50 MCG tablet  Commonly known as:  SYNTHROID, LEVOTHROID  Take 50 mcg by mouth daily before breakfast.     losartan 25 MG tablet  Commonly known as:  COZAAR  Take 50 mg by mouth daily.     omeprazole 20 MG capsule  Commonly known as:  PRILOSEC  Take 20 mg by mouth daily.     oyster calcium 500 MG Tabs tablet  Take 500 mg of elemental calcium by mouth 2 (two) times daily.     ranitidine 150 MG capsule  Commonly known as:  ZANTAC  Take 150 mg by mouth 2 (two) times daily. Taking BID for 7 days then will go to once daily.     senna 8.6 MG Tabs tablet  Commonly known as:  SENOKOT  Take 2 tablets by mouth at bedtime.     tiotropium 18 MCG inhalation capsule  Commonly known as:  SPIRIVA  Place 18 mcg into inhaler and inhale daily.     traMADol 50 MG tablet  Commonly known as:  ULTRAM  Take one tablet by mouth twice daily for OA/Pain     warfarin 4 MG tablet  Commonly known as:  COUMADIN  4 mg. T/TH/Sat, 3.5 mg on M/W/F/Sun        Physical exam BP 118/78 mmHg  Pulse 74  Temp(Src) 97.1 F (36.2 C)  Resp 16  SpO2 96%  Constitutional: Thin, frail, in no distress, comfortable Neck: Neck supple. No tracheal deviation present. No thyromegaly present.   Cardiovascular: Normal rate, regular rhythm and intact distal pulses.  pacemaker in right upper chest. Scar from previous sternotomy. Systolic ejection murmur present Respiratory: Effort normal and breath sounds normal. No respiratory distress. She has no wheezes. On o2   GI:  Soft. Bowel sounds are normal. No epigastric tenderness. She exhibits no distension. There is no tenderness.  Musculoskeletal: She exhibits no edema. 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 Mild left hand contracture. Good distal pulses. Left knee and ankle: no swelling or redness noted, normal skin temperature Neurological: She is alert and can make her needs known, baseline confusion  Skin: Skin is warm and dry.   Labs reviewed: 04-26-14 wbc 5.6, hb 10.7, hct 35.5, plt 187, na 138, k 3.9, bun 20, cr 1.3, glu 82, ca 9.9 06-23-14 wbc 5, hb 10.7, hct 35.5, plt 201 09-02-14 wbc 5.4, hb 10.4, hct 33.6, plt 177, na 144, k 3.9, bun 22, cr 1.3, glu 83, ca 10.2, t.chol 142, ldl 83, hdl 48, tg 54 11-16-14 wbc 4.8, hb 10.6, hct 31.8, plt 198, na 139, k 3.8, bun 22, cr 1.27, glu 82, ca 9.6, t.chol 126, tg 80, ldl 62, hdl 48, tsh 2.164 1-61-09 wbc 4.4, hb 10.1, hct 30.4, mcv 91.3, plt 186, t.chol 131, tg 66, ldl 64, hdl 54 04-07-15 tsh 1.342, vit d 58 06-26-15 t.chol 151, hdl 56, ldl 86, tg 43, b12 770, wbc 4.9, hb 9.1, hct 29.7, plt 213, na 143, k 4.3, glu 79, bun 33, cr 1.6, lft wnl   Assessment/Plan  Hyperlipidemia Stable lipid panel. D/c lipitor  Copd Breathing, stable, continue o2, duonebs and spiriva and monitor clinically  Chronic respiratory failure With copd and chf, currently stable, continue o2 and breathing treatment  CAD Remains chest pain free. Continue aspirin, losartan and monitor. Off statin with normal lab on review  Anemia of chronic disease Change aspirin to EC aspirin,  Drop in hb from before. Check ferritin and erythropoetin level next lab. Start ferrous sulfate 325 mg bid and monitor cbc in 2 weeks   Oneal Grout, MD  [facility] [phone] (Monday-Friday 8 am - 5 pm) [phone] (afterhours)

## 2015-06-29 NOTE — Progress Notes (Signed)
Remote pacemaker transmission.   

## 2015-07-03 LAB — HM DIABETES EYE EXAM

## 2015-07-07 LAB — CUP PACEART REMOTE DEVICE CHECK
Lead Channel Impedance Value: 680 Ohm
Lead Channel Pacing Threshold Amplitude: 0.75 V
Lead Channel Pacing Threshold Pulse Width: 0.4 ms
Lead Channel Sensing Intrinsic Amplitude: 12 mV
MDC IDC MSMT BATTERY REMAINING LONGEVITY: 139 mo
MDC IDC MSMT BATTERY REMAINING PERCENTAGE: 95.5 %
MDC IDC MSMT BATTERY VOLTAGE: 3.01 V
MDC IDC SESS DTM: 20160915060016
MDC IDC SET LEADCHNL RV PACING AMPLITUDE: 1 V
MDC IDC SET LEADCHNL RV PACING PULSEWIDTH: 0.4 ms
MDC IDC SET LEADCHNL RV SENSING SENSITIVITY: 2 mV
MDC IDC STAT BRADY RV PERCENT PACED: 92 %
Pulse Gen Model: 2240
Pulse Gen Serial Number: 7529661

## 2015-07-13 LAB — CBC AND DIFFERENTIAL
HCT: 34 % — AB (ref 36–46)
HEMOGLOBIN: 10.3 g/dL — AB (ref 12.0–16.0)
Platelets: 171 10*3/uL (ref 150–399)
WBC: 4.2 10^3/mL

## 2015-07-14 LAB — BASIC METABOLIC PANEL
BUN: 25 mg/dL — AB (ref 4–21)
CREATININE: 1.2 mg/dL — AB (ref 0.5–1.1)
GLUCOSE: 80 mg/dL
POTASSIUM: 4 mmol/L (ref 3.4–5.3)
Sodium: 141 mmol/L (ref 137–147)

## 2015-07-14 LAB — HEPATIC FUNCTION PANEL
ALT: 10 U/L (ref 7–35)
AST: 26 U/L (ref 13–35)
BILIRUBIN, TOTAL: 0.6 mg/dL

## 2015-07-14 LAB — CBC AND DIFFERENTIAL
HCT: 33 % — AB (ref 36–46)
Hemoglobin: 10.2 g/dL — AB (ref 12.0–16.0)
Platelets: 164 10*3/uL (ref 150–399)
WBC: 4.4 10*3/mL

## 2015-07-14 LAB — LIPID PANEL
Cholesterol: 190 mg/dL (ref 0–200)
HDL: 54 mg/dL (ref 35–70)
LDL Cholesterol: 119 mg/dL
LDl/HDL Ratio: 3.5

## 2015-07-14 LAB — TSH: TSH: 1.1 u[IU]/mL (ref 0.41–5.90)

## 2015-07-19 ENCOUNTER — Encounter: Payer: Self-pay | Admitting: Cardiology

## 2015-07-28 ENCOUNTER — Other Ambulatory Visit: Payer: Self-pay | Admitting: *Deleted

## 2015-07-28 MED ORDER — TRAMADOL HCL 50 MG PO TABS: ORAL_TABLET | ORAL | Status: DC

## 2015-07-28 NOTE — Telephone Encounter (Signed)
[facility] 

## 2015-08-17 ENCOUNTER — Non-Acute Institutional Stay (SKILLED_NURSING_FACILITY): Payer: Medicare Other | Admitting: Internal Medicine

## 2015-08-17 DIAGNOSIS — N183 Chronic kidney disease, stage 3 (moderate): Secondary | ICD-10-CM | POA: Diagnosis not present

## 2015-08-17 DIAGNOSIS — I209 Angina pectoris, unspecified: Secondary | ICD-10-CM

## 2015-08-17 DIAGNOSIS — I5022 Chronic systolic (congestive) heart failure: Secondary | ICD-10-CM

## 2015-08-17 NOTE — Progress Notes (Signed)
Patient ID: Savannah LyonsMary L [name], female   DOB: 01/21/1923, 81 y.o.   MRN: [id number]      [facility]  Code Status: Full Code    Chief Complaint  Patient presents with  . Medical Management of Chronic Issues    Allergies  Allergen Reactions  . Advil [Ibuprofen] Other (See Comments)    "think it just doesn't work for her"  . Alprazolam Other (See Comments)    "don't remember what happens"  . Relafen [Nabumetone] Other (See Comments)    "don't remember what happens"  . Sulfa Antibiotics Other (See Comments)    "don't remember what happens"  . Toprol Xl [Metoprolol Succinate] Other (See Comments)    25mg ; "don't remember what happens"    HPI 81 y/o female patient is seen for routine visit. She has been at her baseline, denies any concerns. unable to obtain hpi and ros. No new concern from staff. She has PMH of dementia, HLD, CKD, CHF, OA among others.   Review of Systems: pt unable to participate with dementia  Past medical history reviewed  [facility]   Physical exam BP 140/85 mmHg  Pulse 76  Temp(Src) 98.3 F (36.8 C)  Resp 18  SpO2 99%  Constitutional: Thin, frail, in no distress, comfortable Neck: Neck supple. No tracheal deviation present. No thyromegaly present.   Cardiovascular: Normal rate, regular rhythm and intact distal pulses.  pacemaker in right upper chest. Scar from previous sternotomy. Systolic ejection murmur present Respiratory: Effort normal and breath sounds normal. No respiratory distress. She has no wheezes. On o2   GI: Soft. Bowel sounds are normal. No epigastric tenderness. She exhibits no distension. There is no tenderness.  Musculoskeletal: She exhibits no edema. Weakness in all 4 extremities with left hemiparesis. Mild left hand contracture. Has muscle wasting Neurological: She is alert and can make her needs known, baseline confusion  Skin: Skin is warm and dry.   Labs reviewed: 04-26-14 wbc 5.6, hb 10.7, hct  35.5, plt 187, na 138, k 3.9, bun 20, cr 1.3, glu 82, ca 9.9 06-23-14 wbc 5, hb 10.7, hct 35.5, plt 201 09-02-14 wbc 5.4, hb 10.4, hct 33.6, plt 177, na 144, k 3.9, bun 22, cr 1.3, glu 83, ca 10.2, t.chol 142, ldl 83, hdl 48, tg 54 11-16-14 wbc 4.8, hb 10.6, hct 31.8, plt 198, na 139, k 3.8, bun 22, cr 1.27, glu 82, ca 9.6, t.chol 126, tg 80, ldl 62, hdl 48, tsh 2.164 [id number] wbc 4.4, hb 10.1, hct 30.4, mcv 91.3, plt 186, t.chol 131, tg 66, ldl 64, hdl 54 04-07-15 tsh 1.342, vit d 58 06-26-15 t.chol 151, hdl 56, ldl 86, tg 43, b12 770, wbc 4.9, hb 9.1, hct 29.7, plt 213, na 143, k 4.3, glu 79, bun 33, cr 1.6, lft wnl   Assessment/Plan  ckd stage 3 Controlled BP and reviewed recent renal function. Continue cozaar 50 mg daily  Angina pectoris Remains chest pain free. Continue aspirin 81 mg daily and cozaar for now  Chronic systolic CHF Stable, continue cozaar 50 mg daily and lasix 20 mg bid and o2. Breathing currently stable   Oneal GroutMAHIMA Mineola Duan, MD  [facility] [phone] (Monday-Friday 8 am - 5 pm) [phone] (afterhours)

## 2015-08-20 DIAGNOSIS — I209 Angina pectoris, unspecified: Secondary | ICD-10-CM | POA: Insufficient documentation

## 2015-09-28 ENCOUNTER — Ambulatory Visit (INDEPENDENT_AMBULATORY_CARE_PROVIDER_SITE_OTHER): Payer: Medicare Other | Admitting: *Deleted

## 2015-09-28 ENCOUNTER — Telehealth: Payer: Self-pay | Admitting: Cardiology

## 2015-09-28 DIAGNOSIS — I442 Atrioventricular block, complete: Secondary | ICD-10-CM

## 2015-09-28 NOTE — Telephone Encounter (Signed)
LMOVM reminding pt to send remote transmission.   

## 2015-09-29 NOTE — Progress Notes (Signed)
Remote pacemaker transmission.   

## 2015-10-02 LAB — CUP PACEART REMOTE DEVICE CHECK
Battery Remaining Percentage: 95.5 %
Battery Voltage: 3.01 V
Date Time Interrogation Session: 20161215070010
Implantable Lead Implant Date: 20011129
Implantable Lead Location: 753860
Lead Channel Impedance Value: 630 Ohm
Lead Channel Setting Pacing Amplitude: 1.125
Lead Channel Setting Pacing Pulse Width: 0.4 ms
MDC IDC LEAD IMPLANT DT: 20011129
MDC IDC LEAD LOCATION: 753859
MDC IDC MSMT BATTERY REMAINING LONGEVITY: 137 mo
MDC IDC MSMT LEADCHNL RV PACING THRESHOLD AMPLITUDE: 0.875 V
MDC IDC MSMT LEADCHNL RV PACING THRESHOLD PULSEWIDTH: 0.4 ms
MDC IDC MSMT LEADCHNL RV SENSING INTR AMPL: 12 mV
MDC IDC SET LEADCHNL RV SENSING SENSITIVITY: 2 mV
MDC IDC STAT BRADY RV PERCENT PACED: 92 %
Pulse Gen Model: 2240
Pulse Gen Serial Number: 7529661

## 2015-10-05 ENCOUNTER — Encounter: Payer: Self-pay | Admitting: Cardiology

## 2015-10-19 ENCOUNTER — Encounter: Payer: Self-pay | Admitting: Cardiology

## 2015-10-25 LAB — POCT INR: INR: 2.6 — AB (ref <=1.1)

## 2015-10-27 LAB — BASIC METABOLIC PANEL
BUN: 24 mg/dL — AB (ref 4–21)
CREATININE: 1.2 mg/dL — AB (ref 0.5–1.1)
GLUCOSE: 76 mg/dL
Potassium: 4.2 mmol/L (ref 3.4–5.3)
Sodium: 141 mmol/L (ref 137–147)

## 2015-10-27 LAB — CBC AND DIFFERENTIAL
HCT: 37 % (ref 36–46)
Hemoglobin: 11.1 g/dL — AB (ref 12.0–16.0)
PLATELETS: 142 10*3/uL — AB (ref 150–399)
WBC: 3.3 10*3/mL

## 2015-10-31 LAB — POCT INR: INR: 2.9 — AB (ref <=1.1)

## 2015-11-01 LAB — POCT INR: INR: 3 — AB (ref <=1.1)

## 2015-11-02 ENCOUNTER — Encounter: Payer: Self-pay | Admitting: Internal Medicine

## 2015-11-02 ENCOUNTER — Non-Acute Institutional Stay (SKILLED_NURSING_FACILITY): Payer: Medicare Other | Admitting: Internal Medicine

## 2015-11-02 DIAGNOSIS — F329 Major depressive disorder, single episode, unspecified: Secondary | ICD-10-CM | POA: Diagnosis not present

## 2015-11-02 DIAGNOSIS — F0390 Unspecified dementia without behavioral disturbance: Secondary | ICD-10-CM

## 2015-11-02 DIAGNOSIS — J209 Acute bronchitis, unspecified: Secondary | ICD-10-CM | POA: Diagnosis not present

## 2015-11-02 DIAGNOSIS — N183 Chronic kidney disease, stage 3 unspecified: Secondary | ICD-10-CM

## 2015-11-02 DIAGNOSIS — K219 Gastro-esophageal reflux disease without esophagitis: Secondary | ICD-10-CM

## 2015-11-02 DIAGNOSIS — H04123 Dry eye syndrome of bilateral lacrimal glands: Secondary | ICD-10-CM

## 2015-11-02 DIAGNOSIS — F03C0 Unspecified dementia, severe, without behavioral disturbance, psychotic disturbance, mood disturbance, and anxiety: Secondary | ICD-10-CM

## 2015-11-02 NOTE — Progress Notes (Signed)
Patient ID: [name], female   DOB: 01/19/1923, 82 y.o.   MRN: [id number]      [facility]  Code Status: Full Code    Chief Complaint  Patient presents with  . Medical Management of Chronic Issues    Routine Visit    Allergies  Allergen Reactions  . Advil [Ibuprofen] Other (See Comments)    "think it just doesn't work for her"  . Alprazolam Other (See Comments)    "don't remember what happens"  . Novocain [Procaine]   . Relafen [Nabumetone] Other (See Comments)    "don't remember what happens"  . Sulfa Antibiotics Other (See Comments)    "don't remember what happens"  . Toprol Xl [Metoprolol Succinate] Other (See Comments)    ; "don't remember what happens"    HPI 82 y/o female patient is seen for routine visit. She has been at her baseline. She just completed course of azitrhomycin for cough and congestion on 10/31/15. CXR reviewed which is negative for bronchitis and pneumonia changes. unable to obtain hpi and ros. No new concern from staff. She has PMH of dementia, HLD, CKD, CHF, OA among others.   Review of Systems: pt unable to participate with dementia  Past medical history reviewed  Medication reviewed.    Medication List       This list is accurate as of: 11/02/15  3:49 PM.  Always use your most recent med list.               acetaminophen 325 MG tablet  Commonly known as:  TYLENOL  Take 650 mg by mouth 2 (two) times daily.     artificial tears Oint ophthalmic ointment  Place 1 application into both eyes at bedtime.     aspirin 81 MG tablet  Take 81 mg by mouth daily.     Cholecalciferol 2000 units Caps  Take by mouth.     citalopram 10 MG tablet  Commonly known as:  CELEXA  Take 20 mg by mouth daily.     docusate sodium 100 MG capsule  Commonly known as:  COLACE  Take 100 mg by mouth daily.     donepezil 10 MG tablet  Commonly known as:  ARICEPT  Take 10 mg by mouth at bedtime.     ferrous sulfate 325 (65 FE) MG  tablet  Take 325 mg by mouth 2 (two) times daily with a meal.     furosemide 20 MG tablet  Commonly known as:  LASIX  Take 20 mg by mouth 2 (two) times daily.     latanoprost 0.005 % ophthalmic solution  Commonly known as:  XALATAN  Place 1 drop into both eyes at bedtime.     levETIRAcetam 250 MG tablet  Commonly known as:  KEPPRA  Take 500 mg by mouth 2 (two) times daily.     levothyroxine 50 MCG tablet  Commonly known as:  SYNTHROID, LEVOTHROID  Take 50 mcg by mouth daily before breakfast.     losartan 25 MG tablet  Commonly known as:  COZAAR  Take 50 mg by mouth daily.     omeprazole 20 MG capsule  Commonly known as:  PRILOSEC  Take 20 mg by mouth daily.     oyster calcium 500 MG Tabs tablet  Take 500 mg of elemental calcium by mouth 2 (two) times daily.     ranitidine 150 MG capsule  Commonly known as:  ZANTAC  Take 150 mg by mouth  2 (two) times daily. Taking BID for 7 days then will go to once daily.     saccharomyces boulardii 250 MG capsule  Commonly known as:  FLORASTOR  Take 250 mg by mouth 2 (two) times daily. For 21 days starting on 10/26/15     senna 8.6 MG Tabs tablet  Commonly known as:  SENOKOT  Take 2 tablets by mouth at bedtime.     tiotropium 18 MCG inhalation capsule  Commonly known as:  SPIRIVA  Place 18 mcg into inhaler and inhale daily.     traMADol 50 MG tablet  Commonly known as:  ULTRAM  Take one tablet by mouth twice daily for OA/Pain     warfarin 4 MG tablet  Commonly known as:  COUMADIN  4 mg. T/TH/Sat, 3.5 mg on M/W/F/Sun         Physical exam BP 112/58 mmHg  Pulse 77  Temp(Src) 97.2 F (36.2 C)  Resp 16  Ht 5' (1.524 m)  Wt 121 lb (54.885 kg)  BMI 23.63 kg/m2  SpO2 99%  Wt Readings from Last 3 Encounters:  11/02/15 121 lb (54.885 kg)  05/29/15 117 lb (53.071 kg)  03/22/15 117 lb 12.8 oz (53.434 kg)   Constitutional: Thin, frail, in no distress, comfortable Neck: Neck supple. No thyromegaly present.     Cardiovascular: Normal rate, regular rhythm and intact distal pulses.  pacemaker in right upper chest. Scar from previous sternotomy. Systolic ejection murmur present Respiratory: Effort normal but poor air entry. No respiratory distress. She has no wheezes. On o2.    GI: Soft. Bowel sounds are normal. No epigastric tenderness. She exhibits no distension. There is no tenderness.  Musculoskeletal: She exhibits no edema. Weakness in all 4 extremities with left hemiparesis. Mild left hand contracture. Has muscle wasting Neurological: She is alert and can make her needs known, baseline confusion    Labs reviewed: CBC Latest Ref Rng 07/14/2015 07/13/2015 02/03/2015  WBC - 4.4 4.2 4.4  Hemoglobin 12.0 - 16.0 g/dL 10.2(A) 10.3(A) 10.1(A)  Hematocrit 36 - 46 % 33(A) 34(A) 30(A)  Platelets 150 - 399 K/L 164 171 186   CMP Latest Ref Rng 07/14/2015 06/26/2015 06/19/2014  Glucose 65-99 mg/dL - - 161(W)  BUN 4 - 21 mg/dL 96(E) 45(W) 14  Creatinine 0.5 - 1.1 mg/dL 1.2(A) 1.6(A) 1.31(H)  Sodium 137 - 147 mmol/L 141 143 138  Potassium 3.4 - 5.3 mmol/L 4.0 4.3 4.1  Chloride 98-107 mmol/L - - 102  CO2 21-32 mmol/L - - 31  Calcium 8.5-10.1 mg/dL - - 9.6  Total Protein 6.0 - 8.3 g/dL - - -  Total Bilirubin 0.3 - 1.2 mg/dL - - -  Alkaline Phos 25 - 125 U/L - 72 -  AST 13 - 35 U/L 26 27 -  ALT 7 - 35 U/L 10 11 -   02-03-15 wbc 4.4, hb 10.1, hct 30.4, mcv 91.3, plt 186, t.chol 131, tg 66, ldl 64, hdl 54 04-07-15 tsh 1.342, vit d 58 06-26-15 t.chol 151, hdl 56, ldl 86, tg 43, b12 770, wbc 4.9, hb 9.1, hct 29.7, plt 213, na 143, k 4.3, glu 79, bun 33, cr 1.6, lft wnl   Assessment/Plan  Tear film insufficiency Continue artificial tears  Chronic depression With her dementia playing major role in this. Continue celexa but attempt GDR with decrease to 10 mg daily.   Dementia Continue aricpet for now, high aspiration risk, assistance with ADLs, fall precautions and pressure ulcer prophylaxis  Acute  URI Completed  her azithromycin 7 days course and mucinex. Continue oxygen for now, poor air entry to bases, has high aspiration risk with her dementia, aspiration precautions and get SLP consult  ckd stage 3 Controlled BP and reviewed recent renal function. Continue cozaar 50 mg daily  gerd Without esophagitis, on prilosec and ranitidine. Discontinue ranitidine for now and continue prilosec   Oneal Grout, MD  [facility] [phone] (Monday-Friday 8 am - 5 pm) [phone] (afterhours)

## 2015-11-02 NOTE — Progress Notes (Deleted)
Patient ID: [name], female   DOB: Jun 09, 1923, 82 y.o.   MRN: [id number]      [facility]  Code Status: Full Code    Chief Complaint  Patient presents with  . Medical Management of Chronic Issues    Routine Visit    Allergies  Allergen Reactions  . Advil [Ibuprofen] Other (See Comments)    "think it just doesn't work for her"  . Alprazolam Other (See Comments)    "don't remember what happens"  . Novocain [Procaine]   . Relafen [Nabumetone] Other (See Comments)    "don't remember what happens"  . Sulfa Antibiotics Other (See Comments)    "don't remember what happens"  . Toprol Xl [Metoprolol Succinate] Other (See Comments)    ; "don't remember what happens"    HPI 82 y/o female patient is seen for routine visit. She has been at her baseline, denies any concerns. unable to obtain hpi and ros. No new concern from staff. She has PMH of dementia, HLD, CKD, CHF, OA among others.   Review of Systems: pt unable to participate with dementia  Past medical history reviewed  Medication reviewed. See [facility]   Physical exam BP 112/58 mmHg  Pulse 77  Temp(Src) 97.2 F (36.2 C)  Resp 16  Ht 5' (1.524 m)  Wt 121 lb (54.885 kg)  BMI 23.63 kg/m2  SpO2 99%  Constitutional: Thin, frail, in no distress, comfortable Neck: Neck supple. No tracheal deviation present. No thyromegaly present.   Cardiovascular: Normal rate, regular rhythm and intact distal pulses.  pacemaker in right upper chest. Scar from previous sternotomy. Systolic ejection murmur present Respiratory: Effort normal and breath sounds normal. No respiratory distress. She has no wheezes. On o2   GI: Soft. Bowel sounds are normal. No epigastric tenderness. She exhibits no distension. There is no tenderness.  Musculoskeletal: She exhibits no edema. Weakness in all 4 extremities with left hemiparesis. Mild left hand contracture. Has muscle wasting Neurological: She is alert and can make her needs  known, baseline confusion  Skin: Skin is warm and dry.   Labs reviewed: 04-26-14 wbc 5.6, hb 10.7, hct 35.5, plt 187, na 138, k 3.9, bun 20, cr 1.3, glu 82, ca 9.9 06-23-14 wbc 5, hb 10.7, hct 35.5, plt 201 09-02-14 wbc 5.4, hb 10.4, hct 33.6, plt 177, na 144, k 3.9, bun 22, cr 1.3, glu 83, ca 10.2, t.chol 142, ldl 83, hdl 48, tg 54 11-16-14 wbc 4.8, hb 10.6, hct 31.8, plt 198, na 139, k 3.8, bun 22, cr 1.27, glu 82, ca 9.6, t.chol 126, tg 80, ldl 62, hdl 48, tsh 2.164 01-21-80 wbc 4.4, hb 10.1, hct 30.4, mcv 91.3, plt 186, t.chol 131, tg 66, ldl 64, hdl 54 04-07-15 tsh 1.342, vit d 58 06-26-15 t.chol 151, hdl 56, ldl 86, tg 43, b12 770, wbc 4.9, hb 9.1, hct 29.7, plt 213, na 143, k 4.3, glu 79, bun 33, cr 1.6, lft wnl   Assessment/Plan  ckd stage 3 Controlled BP and reviewed recent renal function. Continue cozaar 50 mg daily  Angina pectoris Remains chest pain free. Continue aspirin 81 mg daily and cozaar for now  Chronic systolic CHF Stable, continue cozaar 50 mg daily and lasix 20 mg bid and o2. Breathing currently stable   Oneal Grout, MD  [facility] [phone] (Monday-Friday 8 am - 5 pm) [phone] (afterhours)

## 2015-11-23 ENCOUNTER — Encounter: Payer: Self-pay | Admitting: Internal Medicine

## 2015-11-23 ENCOUNTER — Non-Acute Institutional Stay (SKILLED_NURSING_FACILITY): Payer: Medicare Other | Admitting: Internal Medicine

## 2015-11-23 DIAGNOSIS — F329 Major depressive disorder, single episode, unspecified: Secondary | ICD-10-CM | POA: Diagnosis not present

## 2015-11-23 DIAGNOSIS — G40909 Epilepsy, unspecified, not intractable, without status epilepticus: Secondary | ICD-10-CM

## 2015-11-23 DIAGNOSIS — I480 Paroxysmal atrial fibrillation: Secondary | ICD-10-CM

## 2015-11-23 DIAGNOSIS — K219 Gastro-esophageal reflux disease without esophagitis: Secondary | ICD-10-CM | POA: Diagnosis not present

## 2015-11-23 DIAGNOSIS — N183 Chronic kidney disease, stage 3 unspecified: Secondary | ICD-10-CM

## 2015-11-23 DIAGNOSIS — F0150 Vascular dementia without behavioral disturbance: Secondary | ICD-10-CM | POA: Diagnosis not present

## 2015-11-23 DIAGNOSIS — I5022 Chronic systolic (congestive) heart failure: Secondary | ICD-10-CM | POA: Diagnosis not present

## 2015-11-23 DIAGNOSIS — F0393 Unspecified dementia, unspecified severity, with mood disturbance: Secondary | ICD-10-CM

## 2015-11-23 DIAGNOSIS — E038 Other specified hypothyroidism: Secondary | ICD-10-CM

## 2015-11-23 DIAGNOSIS — Z0000 Encounter for general adult medical examination without abnormal findings: Secondary | ICD-10-CM

## 2015-11-23 DIAGNOSIS — G8190 Hemiplegia, unspecified affecting unspecified side: Secondary | ICD-10-CM | POA: Diagnosis not present

## 2015-11-23 DIAGNOSIS — Z7901 Long term (current) use of anticoagulants: Secondary | ICD-10-CM

## 2015-11-23 DIAGNOSIS — J449 Chronic obstructive pulmonary disease, unspecified: Secondary | ICD-10-CM

## 2015-11-23 DIAGNOSIS — I6930 Unspecified sequelae of cerebral infarction: Secondary | ICD-10-CM | POA: Insufficient documentation

## 2015-11-23 DIAGNOSIS — I10 Essential (primary) hypertension: Secondary | ICD-10-CM

## 2015-11-23 DIAGNOSIS — H04129 Dry eye syndrome of unspecified lacrimal gland: Secondary | ICD-10-CM | POA: Insufficient documentation

## 2015-11-23 DIAGNOSIS — E559 Vitamin D deficiency, unspecified: Secondary | ICD-10-CM | POA: Insufficient documentation

## 2015-11-23 DIAGNOSIS — F0280 Dementia in other diseases classified elsewhere without behavioral disturbance: Secondary | ICD-10-CM

## 2015-11-23 DIAGNOSIS — H04123 Dry eye syndrome of bilateral lacrimal glands: Secondary | ICD-10-CM

## 2015-11-23 DIAGNOSIS — D509 Iron deficiency anemia, unspecified: Secondary | ICD-10-CM

## 2015-11-23 NOTE — Progress Notes (Signed)
Patient ID: [name], female   DOB: Mar 06, 1923, 82 y.o.   MRN: [id number]      [facility]  Code Status: Full Code    Chief Complaint  Patient presents with  . Annual Exam    annual exam    Allergies  Allergen Reactions  . Advil [Ibuprofen] Other (See Comments)    "think it just doesn't work for her"  . Alprazolam Other (See Comments)    "don't remember what happens"  . Novocain [Procaine]   . Relafen [Nabumetone] Other (See Comments)    "don't remember what happens"  . Sulfa Antibiotics Other (See Comments)    "don't remember what happens"  . Toprol Xl [Metoprolol Succinate] Other (See Comments)    ; "don't remember what happens"   Advanced Directives 11/02/2015  Does patient have an advance directive? Yes  Type of Estate agent of [place];Living will  Does patient want to make changes to advanced directive? No - Patient declined  Copy of advanced directive(s) in chart? Yes  Pre-existing out of facility DNR order (yellow form or pink MOST form) -   Extended Emergency Contact Information Primary Emergency Contact: [name],Savannah Address: [address]           [place], [place] [address] [place] Home Phone: [phone] Work Phone: [phone] Mobile Phone: [phone] Relation: Daughter    HPI 82 y/o female patient is seen for annual visit. She has been at her baseline. She is off ranitidine and doing fine. No recent infection reported. No new concern from staff. She feeds herself at times but needs help otherwise with her ADL and is under total care. She has been getting out of bed daily. no pressure ulcer reported. No falls reported. With her advanced dementia, unable to obtain HPI and ROS. She has medical history of advanced dementia, afib on coumadin, HTN, old CVA, HLD, CKD, CHF, OA, PVD among others. Weight has been stable.   Review of Systems: pt unable to participate with dementia  Past Medical  History  Diagnosis Date  . Hypertension   . Hyperlipidemia   . Hypothyroidism   . Atrial fibrillation (HCC)   . Pacemaker   . Shortness of breath   . CHF (congestive heart failure) (HCC) 05/27/2006; 05/30/2006; 11/06/2008  . Vascular dementia     "due to CVA 06/08/2009"  . Stroke (HCC) 03/06/2001; 05/30/2009    "light stroke"  . Stroke (HCC) 06/08/2009    "probable small TIA"  . Stroke (HCC) 01/06/2002  . Angina 12/22/2003  . Ventilator dependent (HCC) 06/08/2009    respiratory failure  . H/O: rheumatic fever     "childhood"  . Coronary artery disease   . Left atrial enlargement   . H/O cardiomegaly     diastolic  . Pulmonary hypertension (HCC)   . Anemia     "iron transfusions"  . Blood transfusion   . History of lower GI bleeding     "multiple; required blood transfusions"  . COPD (chronic obstructive pulmonary disease) (HCC)   . GERD (gastroesophageal reflux disease)   . H/O hiatal hernia   . H/O chronic bronchitis   . Asthma   . Arthritis   . Anxiety   . Depression   . Dysphagia as late effect of stroke   . Autoimmune hepatitis (HCC) 07/12/1999  . Bilateral renal cysts     "2 large on right off the lower pole"  . Chronic renal insufficiency   . Pleural  effusion     "moderate right & small left"  . Bacterial pneumonia 11/06/2008; 10/23/2009; 03/30/2010  . Avascular necrosis of femoral head (HCC)     right  . A-fib (HCC) 01/04/2013  . Hx of atrioventricular node ablation 09/27/2014   Past Surgical History  Procedure Laterality Date  . Insert / replace / remove pacemaker  09/11/2000  . Av node ablation  "06/06/2005"  . Percutaneous tracheostomy  06/15/2009  . Electroencephalogram  01/28/2002; 11/26/2005; 10/23/2009  . Cardiac catheterization  06/14/1999; 12/26/2003; 05/30/2009  . Transcranial doppler & carotid ultrasound  11/28/2005  . Cardiac valve replacement  08/20/1999    "mitral valve replacement; St. Jude"  . Cardiac valve replacement  06/05/2009     "redo sternotomy &  aortic valve replacement; tissue valve"  . Cauterized cecal av malformation    . Abdominal hysterectomy  1960's  . Dental implants removed  03/04/2001    lower  . Knee arthroscopy  12/26/2000    left  . Knee arthroscopy  07/29/2001    right  . Pacemaker generator change N/A 05/24/2013    Procedure: PACEMAKER GENERATOR CHANGE;  Surgeon: Thurmon Fair, MD;  Location: [facility];  Service: Cardiovascular;  Laterality: N/A;   History reviewed. No pertinent family history.   Social History   Social History  . Marital Status: Widowed    Spouse Name: N/A  . Number of Children: N/A  . Years of Education: N/A   Occupational History  . Not on file.   Social History Main Topics  . Smoking status: Former Smoker -- 1.00 packs/day for 30 years    Types: Cigarettes    Quit date: 10/14/1970  . Smokeless tobacco: Never Used  . Alcohol Use: No  . Drug Use: No  . Sexual Activity: No   Other Topics Concern  . Not on file   Social History Narrative   Resides [facility] since 2010 Stroke.     Medication reviewed.    Medication List       This list is accurate as of: 11/23/15  5:20 PM.  Always use your most recent med list.               acetaminophen 325 MG tablet  Commonly known as:  TYLENOL  Take 650 mg by mouth 3 (three) times daily.     aspirin 81 MG tablet  Take 81 mg by mouth daily.     Cholecalciferol 2000 units Caps  Take 2,000 Units by mouth daily.     citalopram 10 MG tablet  Commonly known as:  CELEXA  Take 10 mg by mouth daily.     docusate sodium 100 MG capsule  Commonly known as:  COLACE  Take 100 mg by mouth 2 (two) times daily.     donepezil 10 MG tablet  Commonly known as:  ARICEPT  Take 10 mg by mouth at bedtime.     ferrous sulfate 325 (65 FE) MG tablet  Take 325 mg by mouth 2 (two) times daily with a meal.     furosemide 20 MG tablet  Commonly known as:  LASIX  Take 20 mg by mouth 2 (two) times daily.     latanoprost 0.005 % ophthalmic  solution  Commonly known as:  XALATAN  Place 1 drop into both eyes at bedtime.     levETIRAcetam 250 MG tablet  Commonly known as:  KEPPRA  Take 500 mg by mouth 2 (two) times daily.     levothyroxine 50  MCG tablet  Commonly known as:  SYNTHROID, LEVOTHROID  Take 50 mcg by mouth daily before breakfast. Check pulse weekly on Wednesday     losartan 25 MG tablet  Commonly known as:  COZAAR  Take 50 mg by mouth daily.     omeprazole 20 MG capsule  Commonly known as:  PRILOSEC  Take 20 mg by mouth. Take every other day for GERD. Take on an empty stomach     OXYGEN  Inhale 2 L into the lungs.     oyster calcium 500 MG Tabs tablet  Take 500 mg of elemental calcium by mouth 2 (two) times daily.     senna 8.6 MG Tabs tablet  Commonly known as:  SENOKOT  Take 2 tablets by mouth at bedtime.     tiotropium 18 MCG inhalation capsule  Commonly known as:  SPIRIVA  Place 18 mcg into inhaler and inhale daily. Inhale 1 capsule contents ( by taking 2 separate inhalations via handinhaler device)     traMADol 50 MG tablet  Commonly known as:  ULTRAM  Take one tablet by mouth twice daily for OA/Pain     UNABLE TO FIND  Med Name: Magic cup twice daily with lunch and dinner     warfarin 2.5 MG tablet  Commonly known as:  COUMADIN  Take 2.5 mg by mouth daily. Take along with 2 mg tablet (4.5 mg total dose)     warfarin 2 MG tablet  Commonly known as:  COUMADIN  Take 2 mg by mouth daily. Take along with 2.5 mg tablet ( 4.5 mg total dose)     WHITE PETROLATUM-MINERAL OIL OP  Apply 1 application to eye 2 (two) times daily. Wait 3-5 minutes between 2 eye medications       Immunization History  Administered Date(s) Administered  . Influenza Split 07/19/2014, 08/14/2014  . Influenza-Unspecified 08/03/2015  . PPD Test 12/20/2012, 07/01/2014, 06/21/2015  . Pneumococcal-Unspecified 07/19/2011    Physical exam BP 132/73 mmHg  Pulse 75  Temp(Src) 97.5 F (36.4 C) (Oral)  Resp 18  Ht 5'  (1.524 m)  Wt 126 lb 9.6 oz (57.425 kg)  BMI 24.72 kg/m2  SpO2 99%  Wt Readings from Last 3 Encounters:  11/23/15 126 lb 9.6 oz (57.425 kg)  11/02/15 121 lb (54.885 kg)  05/29/15 117 lb (53.071 kg)   Constitutional: Thin, frail, elderly female in no distress Head- atraumatic, normocephalic Eyes- PERRLA, EOMI, no pallor, no icterus, no discharge Ears- left ear normal tympanic membrane and normal external ear canal , right ear normal tympanic membrane and normal external ear canal Neck- no lymphadenopathy, no thyromegaly, no jugular vein distension Nose- normal nasal mucosa, no maxillary sinus tenderness, no frontal sinus tenderness Mouth- normal mucus membrane Cardiovascular- normal s1,s2, + systolic ejection murmur, pacemaker to right upper chest, scar from previous sternotomy.  Respiratory- bilateral clear to auscultation, no wheeze, no rhonchi, no crackles, on o2 by nasal canula Abdomen- bowel sounds present, soft, non tender Musculoskeletal- left sided hemiparesis, left hand contracture, temporal muscle wasting, generalized weakness Neurological- pleasantly confused, can give one word answer at times or nods her head Skin- warm and dry   Labs reviewed: CBC Latest Ref Rng 10/27/2015 07/14/2015 07/13/2015  WBC - 3.3 4.4 4.2  Hemoglobin 12.0 - 16.0 g/dL 11.1(A) 10.2(A) 10.3(A)  Hematocrit 36 - 46 % 37 33(A) 34(A)  Platelets 150 - 399 K/L 142(A) 164 171   CMP Latest Ref Rng 10/27/2015 07/14/2015 06/26/2015  Glucose 65-99 mg/dL - - -  BUN 4 - 21 mg/dL 16(X) 09(U) 04(V)  Creatinine 0.5 - 1.1 mg/dL 1.2(A) 1.2(A) 1.6(A)  Sodium 137 - 147 mmol/L 141 141 143  Potassium 3.4 - 5.3 mmol/L 4.2 4.0 4.3  Chloride 98-107 mmol/L - - -  CO2 21-32 mmol/L - - -  Calcium 8.5-10.1 mg/dL - - -  Total Protein 6.0 - 8.3 g/dL - - -  Total Bilirubin 0.3 - 1.2 mg/dL - - -  Alkaline Phos 25 - 125 U/L - - 72  AST 13 - 35 U/L - 26 27  ALT 7 - 35 U/L - 10 11   Lipid Panel     Component Value Date/Time     CHOL 190 07/14/2015   TRIG 43 06/26/2015   HDL 54 07/14/2015   CHOLHDL 3.6 02/05/2012 0550   VLDL 16 02/05/2012 0550   LDLCALC 119 07/14/2015   Lab Results  Component Value Date   TSH 1.10 07/14/2015   Lab Results  Component Value Date   HGBA1C 5.2 06/09/2012   04-07-15 vit d 58  Assessment/Plan  Vascular dementia Advanced with decline anticipated. Continue aricept for now. Continue assistance with ADLs, fall precautions and pressure ulcer prophylaxis  History of CVA Continue baby aspirin and BP medications.   Vitamin d def Reviewed vitamin d level. Continue vitamin d supple,ent  Annual exam Reviewed medication and labs. uptodate with immunization and eye exam. Tolerating GDR attempt for her psychotropic medication. Weight stable. Getting assistance with her ADLs. Goals of care reviewed in chart. Reviewed chronic medical illness.  Chronic depression Stable. Tolerating lowered dose of celexa well. Continue celexa 10 mg daily  Left hemiplegia post cva Continue assistance with ADLs, fall precautions  Tear film insufficiency Continue artificial tears  Constipation Continue colace and senokot  Iron def anemia Continue ferrous sulfate, reviewed h&h, stable  CHF Appears euvolemic, continue losartan and lasix, monitor bmp  Hypothyroidism Reviewed tsh, continue levothyroxine current regimen  gerd Stable on prilosec, monitor  ckd stage 3 Controlled BP and reviewed recent renal function. Continue cozaar and lasix  afib Rate controlled. Continue coumadin and monitor inr  Long term anticoagulation With hx of afib and AVR and MVR. With coumadin, monitor inr  Copd Continue o2 and spiriva, stable breathing  Seizure disorder Remains seizure free. Continue keppra, no dosing changes made this visit.     Oneal Grout, MD  [facility] [phone] (Monday-Friday 8 am - 5 pm) [phone] (afterhours)

## 2015-12-13 LAB — CBC AND DIFFERENTIAL
HEMATOCRIT: 32 % — AB (ref 36–46)
Hemoglobin: 10.4 g/dL — AB (ref 12.0–16.0)
PLATELETS: 186 10*3/uL (ref 150–399)
WBC: 5.2 10*3/mL

## 2015-12-13 LAB — HEPATIC FUNCTION PANEL
ALK PHOS: 79 U/L (ref 25–125)
ALT: 17 U/L (ref 7–35)
AST: 33 U/L (ref 13–35)
Bilirubin, Total: 0.7 mg/dL

## 2015-12-13 LAB — LIPID PANEL
CHOLESTEROL: 214 mg/dL — AB (ref 0–200)
HDL: 60 mg/dL (ref 35–70)
LDL CALC: 143 mg/dL
TRIGLYCERIDES: 57 mg/dL (ref 40–160)

## 2015-12-13 LAB — BASIC METABOLIC PANEL
BUN: 27 mg/dL — AB (ref 4–21)
Creatinine: 1.1 mg/dL (ref 0.5–1.1)
Glucose: 85 mg/dL
Potassium: 4.6 mmol/L (ref 3.4–5.3)
Sodium: 137 mmol/L (ref 137–147)

## 2015-12-13 LAB — TSH: TSH: 1.58 u[IU]/mL (ref 0.41–5.90)

## 2015-12-27 ENCOUNTER — Non-Acute Institutional Stay (SKILLED_NURSING_FACILITY): Payer: Medicare Other | Admitting: Internal Medicine

## 2015-12-27 ENCOUNTER — Encounter: Payer: Self-pay | Admitting: Internal Medicine

## 2015-12-27 DIAGNOSIS — K5909 Other constipation: Secondary | ICD-10-CM | POA: Insufficient documentation

## 2015-12-27 DIAGNOSIS — I6930 Unspecified sequelae of cerebral infarction: Secondary | ICD-10-CM

## 2015-12-27 DIAGNOSIS — K5900 Constipation, unspecified: Secondary | ICD-10-CM | POA: Diagnosis not present

## 2015-12-27 DIAGNOSIS — G40909 Epilepsy, unspecified, not intractable, without status epilepticus: Secondary | ICD-10-CM

## 2015-12-27 NOTE — Progress Notes (Signed)
Patient ID: [name], female   DOB: 1922-10-24, 81 y.o.   MRN: [id number]      [facility]  Code Status: Full Code    Chief Complaint  Patient presents with  . Medical Management of Chronic Issues    Routine Visit    Allergies  Allergen Reactions  . Advil [Ibuprofen] Other (See Comments)    "think it just doesn't work for her"  . Alprazolam Other (See Comments)    "don't remember what happens"  . Novocain [Procaine]   . Relafen [Nabumetone] Other (See Comments)    "don't remember what happens"  . Sulfa Antibiotics Other (See Comments)    "don't remember what happens"  . Toprol Xl [Metoprolol Succinate] Other (See Comments)    ; "don't remember what happens"   Advanced Directives 11/02/2015  Does patient have an advance directive? Yes  Type of Estate agent of [place];Living will  Does patient want to make changes to advanced directive? No - Patient declined  Copy of advanced directive(s) in chart? Yes  Pre-existing out of facility DNR order (yellow form or pink MOST form) -   Extended Emergency Contact Information Primary Emergency Contact: Chapman,Vincenzina Address: [address]           [place], [place] [address] [place] Home Phone: [phone] Work Phone: [phone] Mobile Phone: [phone] Relation: Daughter    HPI 82 y/o female patient is seen for routine visit. She has been at her baseline. No new concern from staff. Weight has been stable and has gained weight.   Review of Systems: pt unable to participate with dementia  Past Medical History  Diagnosis Date  . Hypertension   . Hyperlipidemia   . Hypothyroidism   . Atrial fibrillation (HCC)   . Pacemaker   . Shortness of breath   . CHF (congestive heart failure) (HCC) 05/27/2006; 05/30/2006; 11/06/2008  . Vascular dementia     "due to CVA 06/08/2009"  . Stroke (HCC) 03/06/2001; 05/30/2009    "light stroke"  . Stroke (HCC) 06/08/2009   "probable small TIA"  . Stroke (HCC) 01/06/2002  . Angina 12/22/2003  . Ventilator dependent (HCC) 06/08/2009    respiratory failure  . H/O: rheumatic fever     "childhood"  . Coronary artery disease   . Left atrial enlargement   . H/O cardiomegaly     diastolic  . Pulmonary hypertension (HCC)   . Anemia     "iron transfusions"  . Blood transfusion   . History of lower GI bleeding     "multiple; required blood transfusions"  . COPD (chronic obstructive pulmonary disease) (HCC)   . GERD (gastroesophageal reflux disease)   . H/O hiatal hernia   . H/O chronic bronchitis   . Asthma   . Arthritis   . Anxiety   . Depression   . Dysphagia as late effect of stroke   . Autoimmune hepatitis (HCC) 07/12/1999  . Bilateral renal cysts     "2 large on right off the lower pole"  . Chronic renal insufficiency   . Pleural effusion     "moderate right & small left"  . Bacterial pneumonia 11/06/2008; 10/23/2009; 03/30/2010  . Avascular necrosis of femoral head (HCC)     right  . A-fib (HCC) 01/04/2013  . Hx of atrioventricular node ablation 09/27/2014   Past Surgical History  Procedure Laterality Date  . Insert / replace / remove pacemaker  09/11/2000  . Av node ablation  "  06/06/2005"  . Percutaneous tracheostomy  06/15/2009  . Electroencephalogram  01/28/2002; 11/26/2005; 10/23/2009  . Cardiac catheterization  06/14/1999; 12/26/2003; 05/30/2009  . Transcranial doppler & carotid ultrasound  11/28/2005  . Cardiac valve replacement  08/20/1999    "mitral valve replacement; St. Jude"  . Cardiac valve replacement  06/05/2009     "redo sternotomy & aortic valve replacement; tissue valve"  . Cauterized cecal av malformation    . Abdominal hysterectomy  1960's  . Dental implants removed  03/04/2001    lower  . Knee arthroscopy  12/26/2000    left  . Knee arthroscopy  07/29/2001    right  . Pacemaker generator change N/A 05/24/2013    Procedure: PACEMAKER GENERATOR CHANGE;  Surgeon: Thurmon FairMihai Croitoru, MD;   Location: [facility] CATH [facility];  Service: Cardiovascular;  Laterality: N/A;    Medication reviewed.    Medication List       This list is accurate as of: 12/27/15  4:05 PM.  Always use your most recent med list.               acetaminophen 325 MG tablet  Commonly known as:  TYLENOL  Take 650 mg by mouth 3 (three) times daily.     aspirin 81 MG tablet  Take 81 mg by mouth daily.     Cholecalciferol 2000 units Caps  Take 2,000 Units by mouth daily.     citalopram 10 MG tablet  Commonly known as:  CELEXA  Take 10 mg by mouth daily.     docusate sodium 100 MG capsule  Commonly known as:  COLACE  Take 100 mg by mouth 2 (two) times daily.     donepezil 10 MG tablet  Commonly known as:  ARICEPT  Take 10 mg by mouth at bedtime.     ferrous sulfate 325 (65 FE) MG tablet  Take 325 mg by mouth 2 (two) times daily with a meal.     furosemide 20 MG tablet  Commonly known as:  LASIX  Take 20 mg by mouth 2 (two) times daily.     latanoprost 0.005 % ophthalmic solution  Commonly known as:  XALATAN  Place 1 drop into both eyes at bedtime.     levETIRAcetam 250 MG tablet  Commonly known as:  KEPPRA  Take 500 mg by mouth 2 (two) times daily.     levothyroxine 50 MCG tablet  Commonly known as:  SYNTHROID, LEVOTHROID  Take 50 mcg by mouth daily before breakfast. Check pulse weekly on Wednesday     losartan 25 MG tablet  Commonly known as:  COZAAR  Take 50 mg by mouth daily.     omeprazole 20 MG capsule  Commonly known as:  PRILOSEC  Take 20 mg by mouth. Take every other day for GERD. Take on an empty stomach     OXYGEN  Inhale 2 L into the lungs.     oyster calcium 500 MG Tabs tablet  Take 500 mg of elemental calcium by mouth 2 (two) times daily.     senna 8.6 MG Tabs tablet  Commonly known as:  SENOKOT  Take 2 tablets by mouth at bedtime.     tiotropium 18 MCG inhalation capsule  Commonly known as:  SPIRIVA  Place 18 mcg into inhaler and inhale daily. Inhale 1 capsule  contents ( by taking 2 separate inhalations via handinhaler device)     traMADol 50 MG tablet  Commonly known as:  ULTRAM  Take one tablet by mouth  twice daily for OA/Pain     warfarin 2.5 MG tablet  Commonly known as:  COUMADIN  Take 2.5 mg by mouth daily. Take along with 2 mg tablet (4.5 mg total dose)     warfarin 2 MG tablet  Commonly known as:  COUMADIN  Take 2 mg by mouth daily. Take along with 2.5 mg tablet ( 4.5 mg total dose)     WHITE PETROLATUM-MINERAL OIL OP  Apply 1 application to eye 2 (two) times daily. Wait 3-5 minutes between 2 eye medications       Immunization History  Administered Date(s) Administered  . Influenza Split 07/19/2014, 08/14/2014  . Influenza-Unspecified 08/03/2015  . PPD Test 12/20/2012, 07/01/2014, 06/21/2015  . Pneumococcal-Unspecified 07/19/2011    Physical exam BP 150/83 mmHg  Pulse 80  Temp(Src) 99.3 F (37.4 C) (Oral)  Resp 20  Ht 5' (1.524 m)  Wt 126 lb (57.153 kg)  BMI 24.61 kg/m2  SpO2 96%  Wt Readings from Last 3 Encounters:  12/27/15 126 lb (57.153 kg)  11/23/15 126 lb 9.6 oz (57.425 kg)  11/02/15 121 lb (54.885 kg)   Constitutional: Thin, frail, elderly female in no distress Head- atraumatic, normocephalic Eyes- PERRLA, EOMI, no pallor, no icterus, no discharge Mouth- normal mucus membrane Cardiovascular- normal s1,s2, + systolic ejection murmur, pacemaker to right upper chest, scar from previous sternotomy.  Respiratory- bilateral clear to auscultation, no wheeze, no rhonchi, no crackles, on o2 by nasal canula Abdomen- bowel sounds present, soft, non tender Musculoskeletal- left sided hemiparesis, left hand contracture, temporal muscle wasting, generalized weakness Neurological- pleasantly confused, can give one word answer at times or nods her head Skin- warm and dry   Labs reviewed: CBC Latest Ref Rng 12/13/2015 10/27/2015 07/14/2015  WBC - 5.2 3.3 4.4  Hemoglobin 12.0 - 16.0 g/dL 10.4(A) 11.1(A) 10.2(A)    Hematocrit 36 - 46 % 32(A) 37 33(A)  Platelets 150 - 399 K/L 186 142(A) 164   CMP Latest Ref Rng 12/13/2015 10/27/2015 07/14/2015  Glucose 65-99 mg/dL - - -  BUN 4 - 21 mg/dL 47(W) 29(F) 62(Z)  Creatinine 0.5 - 1.1 mg/dL 1.1 3.0(Q) 6.5(H)  Sodium 137 - 147 mmol/L 137 141 141  Potassium 3.4 - 5.3 mmol/L 4.6 4.2 4.0  Chloride 98-107 mmol/L - - -  CO2 21-32 mmol/L - - -  Calcium 8.5-10.1 mg/dL - - -  Alkaline Phos 25 - 125 U/L 79 - -  AST 13 - 35 U/L 33 - 26  ALT 7 - 35 U/L 17 - 10   Lipid Panel     Component Value Date/Time   CHOL 214* 12/13/2015   TRIG 57 12/13/2015   HDL 60 12/13/2015   CHOLHDL 3.6 02/05/2012 0550   VLDL 16 02/05/2012 0550   LDLCALC 143 12/13/2015   Lab Results  Component Value Date   TSH 1.58 12/13/2015   Lab Results  Component Value Date   HGBA1C 5.2 06/09/2012   04-07-15 vit d 58  Assessment/Plan  History of CVA Continue baby aspirin and BP medications.   Constipation Stable. On iron supplement and pain meds. Continue colace and senokot  Seizure disorder Remains seizure free. Continue keppra    Oneal Grout, MD  [facility] [phone] (Monday-Friday 8 am - 5 pm) [phone] (afterhours)

## 2015-12-28 ENCOUNTER — Ambulatory Visit (INDEPENDENT_AMBULATORY_CARE_PROVIDER_SITE_OTHER): Payer: Medicare Other | Admitting: *Deleted

## 2015-12-28 DIAGNOSIS — I442 Atrioventricular block, complete: Secondary | ICD-10-CM | POA: Diagnosis not present

## 2015-12-28 NOTE — Progress Notes (Signed)
Remote pacemaker transmission.   

## 2015-12-29 LAB — HEMOGLOBIN A1C: Hemoglobin A1C: 5.2

## 2016-01-03 LAB — BASIC METABOLIC PANEL
BUN: 39 mg/dL — AB (ref 4–21)
Creatinine: 1.5 mg/dL — AB (ref 0.5–1.1)
GLUCOSE: 88 mg/dL
Potassium: 4.1 mmol/L (ref 3.4–5.3)
SODIUM: 144 mmol/L (ref 137–147)

## 2016-01-03 LAB — HEPATIC FUNCTION PANEL
ALT: 14 U/L (ref 7–35)
AST: 35 U/L (ref 13–35)
Alkaline Phosphatase: 83 U/L (ref 25–125)
BILIRUBIN, TOTAL: 0.5 mg/dL

## 2016-01-17 ENCOUNTER — Encounter: Payer: Self-pay | Admitting: Nurse Practitioner

## 2016-01-17 ENCOUNTER — Ambulatory Visit (INDEPENDENT_AMBULATORY_CARE_PROVIDER_SITE_OTHER): Payer: Medicare Other | Admitting: Nurse Practitioner

## 2016-01-17 VITALS — BP 124/80 | HR 72

## 2016-01-17 DIAGNOSIS — I6350 Cerebral infarction due to unspecified occlusion or stenosis of unspecified cerebral artery: Secondary | ICD-10-CM

## 2016-01-17 DIAGNOSIS — G8190 Hemiplegia, unspecified affecting unspecified side: Secondary | ICD-10-CM | POA: Diagnosis not present

## 2016-01-17 DIAGNOSIS — G40909 Epilepsy, unspecified, not intractable, without status epilepticus: Secondary | ICD-10-CM | POA: Diagnosis not present

## 2016-01-17 DIAGNOSIS — F0150 Vascular dementia without behavioral disturbance: Secondary | ICD-10-CM

## 2016-01-17 NOTE — Progress Notes (Signed)
I have reviewed and agreed above plan. 

## 2016-01-17 NOTE — Progress Notes (Signed)
[facility]  PATIENT: [name] DOB: 02/22/1923   REASON FOR VISIT: Follow-up for vascular dementia, history of stroke with left-sided hemiparesis,  Seizure disorder HISTORY FROM: Daughter    HISTORY OF PRESENT ILLNESS:Ms. Weight, 82year-old female returns for followup. She has a history of large right MCA stroke which occurred in August 2010 after an aortic valve replacement thought cardioembolic due to atrial fibrillation. She is on warfarin without further stroke or TIA symptoms She has a vascular dementia and is currently on Aricept without side effects. She also has seizure disorder with last event occurring on 06/27/2014. She is currently on Keppra without side effects. She is residing in a skilled facility. She is nonambulatory. She is dependent for all activities of daily living. She has been less talkative in the last month and has been evaluated by speech therapy. Her diet was changed to pure from soft mechanical. There have been no behavioral problems. There is no skin breakdown. The daughter has multiple questions about vascular dementia. Patient returns for reevaluation. REVIEW OF SYSTEMS: Full 14 system review of systems performed and notable only for those listed, all others are neg:  Constitutional: neg  Cardiovascular: neg Ear/Nose/Throat:  trouble swallowing  Skin: neg Eyes: neg Respiratory: neg Gastroitestinal: neg  Hematology/Lymphatic: neg  Endocrine: neg Musculoskeletal:neg Allergy/Immunology: neg Neurological: neg Psychiatric: neg Sleep : neg   ALLERGIES: Allergies  Allergen Reactions  . Advil [Ibuprofen] Other (See Comments)    "think it just doesn't work for her"  . Alprazolam Other (See Comments)    "don't remember what happens"  . Novocain [Procaine]   . Relafen [Nabumetone] Other (See Comments)    "don't remember what happens"  . Sulfa Antibiotics Other (See Comments)    "don't remember what happens"  . Toprol Xl  [Metoprolol Succinate] Other (See Comments)    ; "don't remember what happens"    HOME MEDICATIONS: Outpatient Prescriptions Prior to Visit  Medication Sig Dispense Refill  . donepezil (ARICEPT) 10 MG tablet Take 10 mg by mouth at bedtime.    . ferrous sulfate 325 (65 FE) MG tablet Take 325 mg by mouth 2 (two) times daily with a meal.    . furosemide (LASIX) 20 MG tablet Take 20 mg by mouth 2 (two) times daily.    Marland Kitchen levETIRAcetam (KEPPRA) 250 MG tablet Take 500 mg by mouth 2 (two) times daily.     . OXYGEN Inhale 2 L into the lungs.    Ethelda Chick (OYSTER CALCIUM) 500 MG TABS Take 500 mg of elemental calcium by mouth 2 (two) times daily.     Marland Kitchen senna (SENOKOT) 8.6 MG TABS Take 2 tablets by mouth at bedtime.    Marland Kitchen tiotropium (SPIRIVA) 18 MCG inhalation capsule Place 18 mcg into inhaler and inhale daily. Inhale 1 capsule contents ( by taking 2 separate inhalations via handinhaler device)    . traMADol (ULTRAM) 50 MG tablet Take one tablet by mouth twice daily for OA/Pain 60 tablet 5  . acetaminophen (TYLENOL) 325 MG tablet Take 650 mg by mouth 3 (three) times daily.     Marland Kitchen aspirin 81 MG tablet Take 81 mg by mouth daily.    . Cholecalciferol 2000 UNITS CAPS Take 2,000 Units by mouth daily.     . citalopram (CELEXA) 10 MG tablet Take 10 mg by mouth daily.     Marland Kitchen docusate sodium (COLACE) 100 MG capsule Take 100 mg by mouth 2 (two) times daily.     Marland Kitchen  latanoprost (XALATAN) 0.005 % ophthalmic solution Place 1 drop into both eyes at bedtime.    Marland Kitchen. levothyroxine (SYNTHROID, LEVOTHROID) 50 MCG tablet Take 50 mcg by mouth daily before breakfast. Check pulse weekly on Wednesday    . losartan (COZAAR) 25 MG tablet Take 50 mg by mouth daily.     Marland Kitchen. omeprazole (PRILOSEC) 20 MG capsule Take 20 mg by mouth. Take every other day for GERD. Take on an empty stomach    . warfarin (COUMADIN) 2 MG tablet Take 2 mg by mouth daily. Take along with 2.5 mg tablet ( 4.5 mg total dose)    . warfarin (COUMADIN) 2.5 MG  tablet Take 2.5 mg by mouth daily. Take along with 2 mg tablet (4.5 mg total dose)    . WHITE PETROLATUM-MINERAL OIL OP Apply 1 application to eye 2 (two) times daily. Wait 3-5 minutes between 2 eye medications     No facility-administered medications prior to visit.    PAST MEDICAL HISTORY: Past Medical History  Diagnosis Date  . Hypertension   . Hyperlipidemia   . Hypothyroidism   . Atrial fibrillation (HCC)   . Pacemaker   . Shortness of breath   . CHF (congestive heart failure) (HCC) 05/27/2006; 05/30/2006; 11/06/2008  . Vascular dementia     "due to CVA 06/08/2009"  . Stroke (HCC) 03/06/2001; 05/30/2009    "light stroke"  . Stroke (HCC) 06/08/2009    "probable small TIA"  . Stroke (HCC) 01/06/2002  . Angina 12/22/2003  . Ventilator dependent (HCC) 06/08/2009    respiratory failure  . H/O: rheumatic fever     "childhood"  . Coronary artery disease   . Left atrial enlargement   . H/O cardiomegaly     diastolic  . Pulmonary hypertension (HCC)   . Anemia     "iron transfusions"  . Blood transfusion   . History of lower GI bleeding     "multiple; required blood transfusions"  . COPD (chronic obstructive pulmonary disease) (HCC)   . GERD (gastroesophageal reflux disease)   . H/O hiatal hernia   . H/O chronic bronchitis   . Asthma   . Arthritis   . Anxiety   . Depression   . Dysphagia as late effect of stroke   . Autoimmune hepatitis (HCC) 07/12/1999  . Bilateral renal cysts     "2 large on right off the lower pole"  . Chronic renal insufficiency   . Pleural effusion     "moderate right & small left"  . Bacterial pneumonia 11/06/2008; 10/23/2009; 03/30/2010  . Avascular necrosis of femoral head (HCC)     right  . A-fib (HCC) 01/04/2013  . Hx of atrioventricular node ablation 09/27/2014    PAST SURGICAL HISTORY: Past Surgical History  Procedure Laterality Date  . Insert / replace / remove pacemaker  09/11/2000  . Av node ablation  "06/06/2005"  . Percutaneous  tracheostomy  06/15/2009  . Electroencephalogram  01/28/2002; 11/26/2005; 10/23/2009  . Cardiac catheterization  06/14/1999; 12/26/2003; 05/30/2009  . Transcranial doppler & carotid ultrasound  11/28/2005  . Cardiac valve replacement  08/20/1999    "mitral valve replacement; St. Jude"  . Cardiac valve replacement  06/05/2009     "redo sternotomy & aortic valve replacement; tissue valve"  . Cauterized cecal av malformation    . Abdominal hysterectomy  1960's  . Dental implants removed  03/04/2001    lower  . Knee arthroscopy  12/26/2000    left  . Knee arthroscopy  07/29/2001  right  . Pacemaker generator change N/A 05/24/2013    Procedure: PACEMAKER GENERATOR CHANGE;  Surgeon: Thurmon Fair, MD;  Location: [facility];  Service: Cardiovascular;  Laterality: N/A;    FAMILY HISTORY: History reviewed. No pertinent family history.  SOCIAL HISTORY: Social History   Social History  . Marital Status: Widowed    Spouse Name: N/A  . Number of Children: N/A  . Years of Education: N/A   Occupational History  . Not on file.   Social History Main Topics  . Smoking status: Former Smoker -- 1.00 packs/day for 30 years    Types: Cigarettes    Quit date: 10/14/1970  . Smokeless tobacco: Never Used  . Alcohol Use: No  . Drug Use: No  . Sexual Activity: No   Other Topics Concern  . Not on file   Social History Narrative   Resides [facility] since 2010 Stroke.      PHYSICAL EXAM  Filed Vitals:   01/17/16 0819  BP: 124/80  Pulse: 72   There is no weight on file to calculate BMI. Generalized: Well developed, in no acute distress O2 dependent Head: normocephalic and atraumatic,. Oropharynx benign  Neurological examination   Mentation: Alert not oriented to time, place, Does not follow  commands,no  speech . Unable to perform MMSE Cranial nerve II-XII: Pupils irregular but reactive to light extraocular movements were full, unable to perform visual field testing.  Left facial droop  Hearing was intact to finger rubbing bilaterally. Uvula tongue midline.  Motor: Moderate left-sided weakness in the upper and lower extremity increased tone on the left side with spasticity, tolerates passive movements,contractures at elbow and wrist  Generalized weakness on the right. Sensory: Withdraws to pain  Coordination: Unable to perform Reflexes: 2+ and symmetric on the right, brisker on the left, plantar responses were flexor bilaterally. Gait and Station: Nonambulatory , in a wheelchair  DIAGNOSTIC DATA (LABS, IMAGING, TESTING) - I reviewed patient records, labs, notes, testing and imaging myself where available.  Lab Results  Component Value Date   WBC 5.2 12/13/2015   HGB 10.4* 12/13/2015   HCT 32* 12/13/2015   MCV 94 06/19/2014   PLT 186 12/13/2015      Component Value Date/Time   NA 137 12/13/2015   NA 138 06/19/2014 1312   NA 141 05/21/2013 1100   K 4.6 12/13/2015   K 4.1 06/19/2014 1312   CL 102 06/19/2014 1312   CL 102 05/21/2013 1100   CO2 31 06/19/2014 1312   CO2 32 05/21/2013 1100   GLUCOSE 101* 06/19/2014 1312   GLUCOSE 87 05/21/2013 1100   BUN 27* 12/13/2015   BUN 14 06/19/2014 1312   BUN 28* 05/21/2013 1100   CREATININE 1.1 12/13/2015   CREATININE 1.31* 06/19/2014 1312   CREATININE 1.37* 05/21/2013 1100   CALCIUM 9.6 06/19/2014 1312   CALCIUM 10.3 05/21/2013 1100   PROT 8.8* 06/09/2012 0558   ALBUMIN 3.8 06/09/2012 0558   AST 33 12/13/2015   ALT 17 12/13/2015   ALKPHOS 79 12/13/2015   BILITOT 1.0 06/09/2012 0558   GFRNONAA 36* 06/19/2014 1312   GFRNONAA 34* 05/21/2013 1100   GFRAA 42* 06/19/2014 1312   GFRAA 39* 05/21/2013 1100   Lab Results  Component Value Date   CHOL 214* 12/13/2015   HDL 60 12/13/2015   LDLCALC 143 12/13/2015   TRIG 57 12/13/2015   CHOLHDL 3.6 02/05/2012    Lab Results  Component Value Date   TSH 1.58 12/13/2015  ASSESSMENT AND PLAN 82 y.o. year old female has a past medical history of Hypertension;  Hyperlipidemia;Atrial fibrillation; Pacemaker; Vascular dementia; Stroke (06/08/2009); COPD (chronic obstructive pulmonary disease); Dysphagia as late effect of stroke; seizure disorder ,gait abnormality and left-sided hemiparesis here to follow-up.She is totally dependent for all ADLs.    Continue Aricept at 10 mg daily Continue warfarin for secondary stroke prevention Continue Keppra for seizure disorder Discussed her dysphagia  and daughter is adamant that she does not want a feeding tube  Additional 10 minutes spent answering questions about vascular dementia  Follow-up yearly and when necessary Vst time 30 min Nilda Riggs, [facility] [employer] [facility], APRN  [facility] [address] [place], [place] [address] [phone]

## 2016-01-17 NOTE — Patient Instructions (Signed)
Per skilled nsg sheet 

## 2016-01-29 ENCOUNTER — Encounter: Payer: Self-pay | Admitting: Internal Medicine

## 2016-01-29 ENCOUNTER — Non-Acute Institutional Stay (SKILLED_NURSING_FACILITY): Payer: Medicare Other | Admitting: Internal Medicine

## 2016-01-29 DIAGNOSIS — I110 Hypertensive heart disease with heart failure: Secondary | ICD-10-CM | POA: Insufficient documentation

## 2016-01-29 DIAGNOSIS — N183 Chronic kidney disease, stage 3 unspecified: Secondary | ICD-10-CM | POA: Insufficient documentation

## 2016-01-29 DIAGNOSIS — F329 Major depressive disorder, single episode, unspecified: Secondary | ICD-10-CM | POA: Insufficient documentation

## 2016-01-29 DIAGNOSIS — M25531 Pain in right wrist: Secondary | ICD-10-CM

## 2016-01-29 NOTE — Progress Notes (Signed)
Patient ID: [name], female   DOB: 1923-07-20, 82 y.o.   MRN: [id number]      [facility]  Code Status: Full Code    Chief Complaint  Patient presents with  . Medical Management of Chronic Issues    Routine Visit    Allergies  Allergen Reactions  . Advil [Ibuprofen] Other (See Comments)    "think it just doesn't work for her"  . Alprazolam Other (See Comments)    "don't remember what happens"  . Novocain [Procaine]   . Relafen [Nabumetone] Other (See Comments)    "don't remember what happens"  . Sulfa Antibiotics Other (See Comments)    "don't remember what happens"  . Toprol Xl [Metoprolol Succinate] Other (See Comments)    ; "don't remember what happens"   Advanced Directives 11/02/2015  Does patient have an advance directive? Yes  Type of Estate agent of [place];Living will  Does patient want to make changes to advanced directive? No - Patient declined  Copy of advanced directive(s) in chart? Yes  Pre-existing out of facility DNR order (yellow form or pink MOST form) -   Extended Emergency Contact Information Primary Emergency Contact: Chapman,Jeraldine Address: [address]           [place], [place] [address] [place] Home Phone: [phone] Work Phone: [phone] Mobile Phone: [phone] Relation: Daughter    HPI 82 y/o female patient is seen for routine visit. She has been at her baseline. She is now on pureed diet with thin liquids and tolerating it well. She continues to be on coumadin and is tolerating it well. She was recently treated with a dose of fluconazole for vaginitis. Staff has concern about swelling to her right wrist as reported by her daughter. Patient is unable to participate in HPI and ROS with advanced dementia. No other concern from staff. Has preventative dressing to her skin.   Review of Systems: unable to participate with dementia  Past Medical History  Diagnosis Date  .  Hypertension   . Hyperlipidemia   . Hypothyroidism   . Atrial fibrillation (HCC)   . Pacemaker   . Shortness of breath   . CHF (congestive heart failure) (HCC) 05/27/2006; 05/30/2006; 11/06/2008  . Vascular dementia     "due to CVA 06/08/2009"  . Stroke (HCC) 03/06/2001; 05/30/2009    "light stroke"  . Stroke (HCC) 06/08/2009    "probable small TIA"  . Stroke (HCC) 01/06/2002  . Angina 12/22/2003  . Ventilator dependent (HCC) 06/08/2009    respiratory failure  . H/O: rheumatic fever     "childhood"  . Coronary artery disease   . Left atrial enlargement   . H/O cardiomegaly     diastolic  . Pulmonary hypertension (HCC)   . Anemia     "iron transfusions"  . Blood transfusion   . History of lower GI bleeding     "multiple; required blood transfusions"  . COPD (chronic obstructive pulmonary disease) (HCC)   . GERD (gastroesophageal reflux disease)   . H/O hiatal hernia   . H/O chronic bronchitis   . Asthma   . Arthritis   . Anxiety   . Depression   . Dysphagia as late effect of stroke   . Autoimmune hepatitis (HCC) 07/12/1999  . Bilateral renal cysts     "2 large on right off the lower pole"  . Chronic renal insufficiency   . Pleural effusion     "moderate right &  small left"  . Bacterial pneumonia 11/06/2008; 10/23/2009; 03/30/2010  . Avascular necrosis of femoral head (HCC)     right  . A-fib (HCC) 01/04/2013  . Hx of atrioventricular node ablation 09/27/2014   Past Surgical History  Procedure Laterality Date  . Insert / replace / remove pacemaker  09/11/2000  . Av node ablation  "06/06/2005"  . Percutaneous tracheostomy  06/15/2009  . Electroencephalogram  01/28/2002; 11/26/2005; 10/23/2009  . Cardiac catheterization  06/14/1999; 12/26/2003; 05/30/2009  . Transcranial doppler & carotid ultrasound  11/28/2005  . Cardiac valve replacement  08/20/1999    "mitral valve replacement; St. Jude"  . Cardiac valve replacement  06/05/2009     "redo sternotomy & aortic valve replacement; tissue  valve"  . Cauterized cecal av malformation    . Abdominal hysterectomy  1960's  . Dental implants removed  03/04/2001    lower  . Knee arthroscopy  12/26/2000    left  . Knee arthroscopy  07/29/2001    right  . Pacemaker generator change N/A 05/24/2013    Procedure: PACEMAKER GENERATOR CHANGE;  Surgeon: Thurmon Fair, MD;  Location: [facility]ATH [facility];  Service: Cardiovascular;  Laterality: N/A;    Medication reviewed.    Medication List       This list is accurate as of: 01/29/16  9:14 AM.  Always use your most recent med list.               acetaminophen 325 MG tablet  Commonly known as:  TYLENOL  Take 650 mg by mouth 3 (three) times daily.     aspirin 81 MG tablet  Take 81 mg by mouth daily.     Cholecalciferol 2000 units Caps  Take 2,000 Units by mouth daily.     citalopram 10 MG tablet  Commonly known as:  CELEXA  Take 10 mg by mouth daily.     docusate sodium 100 MG capsule  Commonly known as:  COLACE  Take 100 mg by mouth 2 (two) times daily.     donepezil 10 MG tablet  Commonly known as:  ARICEPT  Take 10 mg by mouth at bedtime.     ferrous sulfate 325 (65 FE) MG tablet  Take 325 mg by mouth 2 (two) times daily with a meal.     furosemide 20 MG tablet  Commonly known as:  LASIX  Take 20 mg by mouth 2 (two) times daily.     latanoprost 0.005 % ophthalmic solution  Commonly known as:  XALATAN  Place 1 drop into both eyes at bedtime.     levETIRAcetam 250 MG tablet  Commonly known as:  KEPPRA  Take 500 mg by mouth 2 (two) times daily.     levothyroxine 50 MCG tablet  Commonly known as:  SYNTHROID, LEVOTHROID  Take 50 mcg by mouth daily before breakfast. Check pulse weekly on Wednesday     losartan 25 MG tablet  Commonly known as:  COZAAR  Take 50 mg by mouth daily. Reported on 01/17/2016     omeprazole 20 MG capsule  Commonly known as:  PRILOSEC  Take 20 mg by mouth. Take every other day for GERD. Take on an empty stomach     OXYGEN  Inhale 2 L into  the lungs.     oyster calcium 500 MG Tabs tablet  Take 500 mg of elemental calcium by mouth 2 (two) times daily.     senna 8.6 MG Tabs tablet  Commonly known as:  SENOKOT  Take  2 tablets by mouth at bedtime.     tiotropium 18 MCG inhalation capsule  Commonly known as:  SPIRIVA  Place 18 mcg into inhaler and inhale daily. Inhale 1 capsule contents ( by taking 2 separate inhalations via handinhaler device)     traMADol 50 MG tablet  Commonly known as:  ULTRAM  Take one tablet by mouth twice daily for OA/Pain     warfarin 7.5 MG tablet  Commonly known as:  COUMADIN  Take 7.5 mg by mouth daily.     WHITE PETROLATUM-MINERAL OIL OP  Apply 1 application to eye 2 (two) times daily. Wait 3-5 minutes between 2 eye medications       Immunization History  Administered Date(s) Administered  . Influenza Split 07/19/2014, 08/14/2014  . Influenza-Unspecified 08/03/2015  . PPD Test 12/20/2012, 07/01/2014, 06/21/2015  . Pneumococcal-Unspecified 07/19/2011    Physical exam BP 146/74 mmHg  Pulse 75  Temp(Src) 99.3 F (37.4 C) (Oral)  Resp 18  Ht 5' (1.524 m)  Wt 126 lb 4.8 oz (57.289 kg)  BMI 24.67 kg/m2  SpO2 97%  Wt Readings from Last 3 Encounters:  01/29/16 126 lb 4.8 oz (57.289 kg)  12/27/15 126 lb (57.153 kg)  11/23/15 126 lb 9.6 oz (57.425 kg)   Constitutional: Thin, frail, elderly female in no distress Head- atraumatic, normocephalic Eyes- PERRLA, EOMI, no pallor, no icterus, no discharge Mouth- normal mucus membrane Cardiovascular- normal s1,s2, + systolic ejection murmur, pacemaker to right upper chest, scar from previous sternotomy.  Respiratory- bilateral clear to auscultation, no wheeze, no rhonchi, no crackles, on o2 by nasal canula Abdomen- bowel sounds present, soft, non tender Musculoskeletal- left sided hemiparesis, left hand contracture, temporal muscle wasting, right wrist limited extension with erythema, mild warmth and some swelling present, patient winces on  forceful minimal extension of the wrist Neurological- pleasantly confused, unable to assess Skin- warm and dry   Labs reviewed: CBC Latest Ref Rng 12/13/2015 10/27/2015 07/14/2015  WBC - 5.2 3.3 4.4  Hemoglobin 12.0 - 16.0 g/dL 10.4(A) 11.1(A) 10.2(A)  Hematocrit 36 - 46 % 32(A) 37 33(A)  Platelets 150 - 399 K/L 186 142(A) 164   CMP Latest Ref Rng 01/03/2016 12/13/2015 10/27/2015  Glucose 65-99 mg/dL - - -  BUN 4 - 21 mg/dL 16(X) 09(U) 04(V)  Creatinine 0.5 - 1.1 mg/dL 4.0(J) 1.1 8.1(X)  Sodium 137 - 147 mmol/L 144 137 141  Potassium 3.4 - 5.3 mmol/L 4.1 4.6 4.2  Chloride 98-107 mmol/L - - -  CO2 21-32 mmol/L - - -  Calcium 8.5-10.1 mg/dL - - -  Alkaline Phos 25 - 125 U/L 83 79 -  AST 13 - 35 U/L 35 33 -  ALT 7 - 35 U/L 14 17 -   Lipid Panel     Component Value Date/Time   CHOL 214* 12/13/2015   TRIG 57 12/13/2015   HDL 60 12/13/2015   CHOLHDL 3.6 02/05/2012 0550   VLDL 16 02/05/2012 0550   LDLCALC 143 12/13/2015   Lab Results  Component Value Date   TSH 1.58 12/13/2015   Lab Results  Component Value Date   HGBA1C 5.2 12/29/2015   04-07-15 vit d 58  Assessment/Plan  Right wrist inflammation No signs of cellulitis. Concern for arthritis or gout flare up. No known history of gout. Has history of CKD. Currently on tylenol 650 mg tid. Start naproxen 500 mg bid x 3 days, then 250 mg bid x 3 days with meals. If no improvement, assess further. Check uric  acid level  ckd stage 3 Monitor bmp, currently on lasix and losartan. If her renal function worsens, consider discontinuation of ARB  HTN Stable overall with some elevated readings. Given her age will have bp goal of < 150/90. Continue losartan 50 mg daily and lasix 20 mg bid.  Depression Stable mood. Attempt GDR with celexa 10 mg qod x 1 week and discontinuation and reassess.    Oneal GroutMAHIMA Suhail Peloquin, MD  [facility] [phone] (Monday-Friday 8 am - 5 pm) [phone] (afterhours)

## 2016-01-30 LAB — CUP PACEART REMOTE DEVICE CHECK
Battery Remaining Percentage: 95.5 %
Battery Voltage: 3.01 V
Brady Statistic RV Percent Paced: 92 %
Implantable Lead Implant Date: 20011129
Implantable Lead Location: 753860
Lead Channel Impedance Value: 650 Ohm
Lead Channel Pacing Threshold Amplitude: 1 V
Lead Channel Setting Pacing Amplitude: 1.25 V
Lead Channel Setting Pacing Pulse Width: 0.4 ms
MDC IDC LEAD IMPLANT DT: 20011129
MDC IDC LEAD LOCATION: 753859
MDC IDC MSMT BATTERY REMAINING LONGEVITY: 137 mo
MDC IDC MSMT LEADCHNL RV PACING THRESHOLD PULSEWIDTH: 0.4 ms
MDC IDC MSMT LEADCHNL RV SENSING INTR AMPL: 12 mV
MDC IDC SESS DTM: 20170316060015
MDC IDC SET LEADCHNL RV SENSING SENSITIVITY: 2 mV
Pulse Gen Serial Number: 7529661

## 2016-01-31 ENCOUNTER — Encounter: Payer: Self-pay | Admitting: Cardiology

## 2016-02-02 ENCOUNTER — Encounter: Payer: Self-pay | Admitting: Cardiovascular Disease

## 2016-02-02 ENCOUNTER — Ambulatory Visit (INDEPENDENT_AMBULATORY_CARE_PROVIDER_SITE_OTHER): Payer: Medicare Other | Admitting: Cardiovascular Disease

## 2016-02-02 VITALS — BP 130/86 | HR 74 | Ht 60.0 in | Wt 121.0 lb

## 2016-02-02 DIAGNOSIS — F329 Major depressive disorder, single episode, unspecified: Secondary | ICD-10-CM

## 2016-02-02 DIAGNOSIS — Z7901 Long term (current) use of anticoagulants: Secondary | ICD-10-CM

## 2016-02-02 DIAGNOSIS — I480 Paroxysmal atrial fibrillation: Secondary | ICD-10-CM | POA: Diagnosis not present

## 2016-02-02 DIAGNOSIS — I25119 Atherosclerotic heart disease of native coronary artery with unspecified angina pectoris: Secondary | ICD-10-CM | POA: Diagnosis not present

## 2016-02-02 DIAGNOSIS — F0280 Dementia in other diseases classified elsewhere without behavioral disturbance: Secondary | ICD-10-CM

## 2016-02-02 DIAGNOSIS — I272 Other secondary pulmonary hypertension: Secondary | ICD-10-CM

## 2016-02-02 DIAGNOSIS — K219 Gastro-esophageal reflux disease without esophagitis: Secondary | ICD-10-CM

## 2016-02-02 DIAGNOSIS — Z952 Presence of prosthetic heart valve: Secondary | ICD-10-CM

## 2016-02-02 DIAGNOSIS — F0393 Unspecified dementia, unspecified severity, with mood disturbance: Secondary | ICD-10-CM

## 2016-02-02 DIAGNOSIS — Z954 Presence of other heart-valve replacement: Secondary | ICD-10-CM

## 2016-02-02 NOTE — Patient Instructions (Signed)
Your physician wants you to follow-up in: 1 year or sooner if needed. You will receive a reminder letter in the mail two months in advance. If you don't receive a letter, please call our office to schedule the follow-up appointment.   If you need a refill on your cardiac medications before your next appointment, please call your pharmacy.   

## 2016-02-03 ENCOUNTER — Encounter: Payer: Self-pay | Admitting: Cardiovascular Disease

## 2016-02-03 NOTE — Progress Notes (Signed)
Patient ID: [name], female   DOB: [birth year], 82 y.o.   MRN: [id number]      HPI: [name] is a 82 y.o. female who presented to the office for an one year cardiology follow up evaluation.  She resides at [facility].  [name] has a history of rheumatic fever during childhood. In 2001 she underwent St. Jude's mitral valve replacement and also at that time underwent permanent transvenous pacemaker for symptomatically bradycardic., She underwent AV node ablation for atrial fibrillation with associated left atrial flutter and difficult to control ventricular response. Because of progressive aortic valve stenosis she underwent bioprosthetic valve replacement in August 2010. She initially tolerated surgery well, but unfortunately postoperatively developed a significant stroke. Subsequently, she has been in a nursing home and sides at [facility].  In August 2014, she underwent permanent pacemaker generator change when the pacemaker was found to be end-of-life.  This was done by Dr. Sallyanne Kuster.  [name] is  wheelchair bound and  never fully recovered from her significant right temporal infarct  following her aortic valve replacement surgery.. She does have some dementia as well some chronic depression. She  reestablish care with me in January 2015 after Dr. Lowella Fairy retirement.  [name] has a history of seizure disorder.  When I saw her last year  she had 2 short-lived seizures and was started on Keppra 250 mg 2 times daily.  She denies chest pain.  She is unaware of palpitations.  She denies presyncope or syncope.  She is here today with her daughter, who remains very active in her care and also works in urgent care with Dr. Everlene Farrier.  She last saw Dr. Sallyanne Kuster in December 2015 for his pacemaker interrogation.  Subsequent, she has been undergoing every three-month device monitoring remotely.  Her last device interrogation was 2 days ago.  I reviewed this.  Continue to reveal normal pacemaker  function.  Battery longevity is 137 months.  She had blood work drawn today at [facility].  These results are not yet available.  She denies recent chest pain.  She denies significant leg swelling.  She is unaware of her heart racing.  Past Medical History  Diagnosis Date  . Hypertension   . Hyperlipidemia   . Hypothyroidism   . Atrial fibrillation ([place])   . Pacemaker   . Shortness of breath   . CHF (congestive heart failure) ([place]) 05/27/2006; 05/30/2006; 11/06/2008  . Vascular dementia     "due to CVA 06/08/2009"  . Stroke ([place]) 03/06/2001; 05/30/2009    "light stroke"  . Stroke ([place]) 06/08/2009    "probable small TIA"  . Stroke ([place]) 01/06/2002  . Angina 12/22/2003  . Ventilator dependent ([place]) 06/08/2009    respiratory failure  . H/O: rheumatic fever     "childhood"  . Coronary artery disease   . Left atrial enlargement   . H/O cardiomegaly     diastolic  . Pulmonary hypertension ([place])   . Anemia     "iron transfusions"  . Blood transfusion   . History of lower GI bleeding     "multiple; required blood transfusions"  . COPD (chronic obstructive pulmonary disease) ([place])   . GERD (gastroesophageal reflux disease)   . H/O hiatal hernia   . H/O chronic bronchitis   . Asthma   . Arthritis   . Anxiety   . Depression   . Dysphagia as late effect of stroke   . Autoimmune hepatitis ([place]) 07/12/1999  .  Bilateral renal cysts     "2 large on right off the lower pole"  . Chronic renal insufficiency   . Pleural effusion     "moderate right & small left"  . Bacterial pneumonia 11/06/2008; 10/23/2009; 03/30/2010  . Avascular necrosis of femoral head ([place])     right  . A-fib ([place]) 01/04/2013  . Hx of atrioventricular node ablation 09/27/2014    Past Surgical History  Procedure Laterality Date  . Insert / replace / remove pacemaker  09/11/2000  . Av node ablation  "06/06/2005"  . Percutaneous tracheostomy  06/15/2009  . Electroencephalogram  01/28/2002; 11/26/2005; 10/23/2009  . Cardiac  catheterization  06/14/1999; 12/26/2003; 05/30/2009  . Transcranial doppler & carotid ultrasound  11/28/2005  . Cardiac valve replacement  08/20/1999    "mitral valve replacement; St. Jude"  . Cardiac valve replacement  06/05/2009     "redo sternotomy & aortic valve replacement; tissue valve"  . Cauterized cecal av malformation    . Abdominal hysterectomy  1960's  . Dental implants removed  03/04/2001    lower  . Knee arthroscopy  12/26/2000    left  . Knee arthroscopy  07/29/2001    right  . Pacemaker generator change N/A 05/24/2013    Procedure: PACEMAKER GENERATOR CHANGE;  Surgeon: Sanda Klein, MD;  Location: [facility] CATH [facility];  Service: Cardiovascular;  Laterality: N/A;    Allergies  Allergen Reactions  . Advil [Ibuprofen] Other (See Comments)    "think it just doesn't work for her"  . Alprazolam Other (See Comments)    "don't remember what happens"  . Novocain [Procaine]   . Relafen [Nabumetone] Other (See Comments)    "don't remember what happens"  . Sulfa Antibiotics Other (See Comments)    "don't remember what happens"  . Toprol Xl [Metoprolol Succinate] Other (See Comments)    '25mg'$ ; "don't remember what happens"    Current Outpatient Prescriptions  Medication Sig Dispense Refill  . acetaminophen (TYLENOL) 325 MG tablet Take 650 mg by mouth 3 (three) times daily.     Marland Kitchen aspirin 81 MG tablet Take 81 mg by mouth daily.    . cefTRIAXone (ROCEPHIN) 1 g injection Inject into the muscle once.    . Cholecalciferol 2000 UNITS CAPS Take 2,000 Units by mouth daily.     . citalopram (CELEXA) 10 MG tablet Take 10 mg by mouth daily.     Marland Kitchen docusate sodium (COLACE) 100 MG capsule Take 100 mg by mouth 2 (two) times daily.     Marland Kitchen donepezil (ARICEPT) 10 MG tablet Take 10 mg by mouth at bedtime.    . ferrous sulfate 325 (65 FE) MG tablet Take 325 mg by mouth 2 (two) times daily with a meal.    . furosemide (LASIX) 20 MG tablet Take 20 mg by mouth 2 (two) times daily.    Marland Kitchen latanoprost (XALATAN)  0.005 % ophthalmic solution Place 1 drop into both eyes at bedtime.    . levETIRAcetam (KEPPRA) 250 MG tablet Take 500 mg by mouth 2 (two) times daily.     Marland Kitchen levothyroxine (SYNTHROID, LEVOTHROID) 50 MCG tablet Take 50 mcg by mouth daily before breakfast. Check pulse weekly on Wednesday    . losartan (COZAAR) 25 MG tablet Take 50 mg by mouth daily. Reported on 01/17/2016    . naproxen (NAPROSYN) 250 MG tablet Take by mouth 2 (two) times daily with a meal.    . omeprazole (PRILOSEC) 20 MG capsule Take 20 mg by mouth. Take every other  day for GERD. Take on an empty stomach    . OXYGEN Inhale 2 L into the lungs.    [place] (OYSTER CALCIUM) 500 MG TABS Take 500 mg of elemental calcium by mouth 2 (two) times daily.     Marland Kitchen senna (SENOKOT) 8.6 MG TABS Take 2 tablets by mouth at bedtime.    Marland Kitchen tiotropium (SPIRIVA) 18 MCG inhalation capsule Place 18 mcg into inhaler and inhale daily. Inhale 1 capsule contents ( by taking 2 separate inhalations via handinhaler device)    . traMADol (ULTRAM) 50 MG tablet Take one tablet by mouth twice daily for OA/Pain 60 tablet 5  . warfarin (COUMADIN) 7.5 MG tablet Take 7.5 mg by mouth daily.    . WHITE PETROLATUM-MINERAL OIL OP Apply 1 application to eye 2 (two) times daily. Wait 3-5 minutes between 2 eye medications     No current facility-administered medications for this visit.    Social History   Social History  . Marital Status: Widowed    Spouse Name: N/A  . Number of Children: N/A  . Years of Education: N/A   Occupational History  . Not on file.   Social History Main Topics  . Smoking status: Former Smoker -- 1.00 packs/day for 30 years    Types: Cigarettes    Quit date: 10/14/1970  . Smokeless tobacco: Never Used  . Alcohol Use: No  . Drug Use: No  . Sexual Activity: Not on file   Other Topics Concern  . Not on file   Social History Narrative   Resides [facility] since [facility] Stroke.     No family history on file.  ROS General:  Negative; No fevers, chills, or night sweats; positive for chronic weakness; now essentially wheelchair bound  HEENT: Negative; No changes in vision or hearing, sinus congestion, difficulty swallowing Pulmonary: Negative; No cough, wheezing, shortness of breath, hemoptysis Cardiovascular: Negative; No chest pain, presyncope, syncope, palpitations GI: Negative; No nausea, vomiting, diarrhea, or abdominal pain GU: Negative; No dysuria, hematuria, or difficulty voiding Musculoskeletal: Negative; no myalgias, joint pain, or weakness Hematologic/Oncology: Negative; no easy bruising, bleeding Endocrine: Positive for hypothyroidism no heat/cold intolerance; no diabetes Neuro: Positive for stroke with residual weakness; positive for seizures; positive for dementia Skin: Negative; No rashes or skin lesions Psychiatric: Negative; No behavioral problems, depression Sleep: Negative; No snoring, daytime sleepiness, hypersomnolence, bruxism, restless legs, hypnogognic hallucinations, no cataplexy Other comprehensive 14 point system review is negative.  PE BP 130/86 mmHg  Pulse 74  Ht 5' (1.524 m)  Wt 121 lb (54.885 kg)  BMI 23.63 kg/m2   Wt Readings from Last 3 Encounters:  02/02/16 121 lb (54.885 kg)  01/29/16 126 lb 4.8 oz (57.289 kg)  12/27/15 126 lb (57.153 kg)   General: Alert, oriented, no distress; appears frail with significant thoracic kyphosis.  Did not speak throughout the entire evaluation. Skin: normal turgor, no rashes HEENT: Normocephalic, atraumatic. Pupils round and reactive; sclera anicteric;no lid lag. Extraocular muscles intact. No xanthelasmas Nose without nasal septal hypertrophy Mouth/Parynx benign; Mallinpatti scale 3 Neck: No JVD, no carotid bruits; normal carotid upstroke Lungs: clear to ausculatation and percussion; no wheezing or rales Chest wall: no tenderness to palpitation Heart: RRR, s1 s2 normal 2/6 systolic murmur with crisp valve clicks. No S3 gallop. No  diastolic murmur of aortic insufficiency.  Abdomen: soft, nontender; no hepatosplenomehaly, BS+; abdominal aorta nontender and not dilated by palpation. Back: no CVA tenderness; significant thoracic kyphosis. Pulses 2+ Extremities: no clubbing cyanosis or  edema, Homan's sign negative  Neurologic: Residual weakness. Psychologic: depressed affect  ECG (independently read by me): Ventricular paced rhythm at 74 bpm.  April 2016 ECG (independently read by me): V paced rhythm at 74 bpm  ECG (independently read by me): Ventricular paced rhythm at 76 beats per minute with occasional PVC  LABS:  BMP Latest Ref Rng 01/03/2016 12/13/2015 10/27/2015  Glucose 65-99 mg/dL - - -  BUN 4 - 21 mg/dL 39(A) 27(A) 24(A)  Creatinine 0.5 - 1.1 mg/dL 1.5(A) 1.1 1.2(A)  Sodium 137 - 147 mmol/L 144 137 141  Potassium 3.4 - 5.3 mmol/L 4.1 4.6 4.2  Chloride 98-107 mmol/L - - -  CO2 21-32 mmol/L - - -  Calcium 8.5-10.1 mg/dL - - -    Hepatic Function Latest Ref Rng 01/03/2016 12/13/2015 07/14/2015  Total Protein 6.0 - 8.3 g/dL - - -  Albumin 3.5 - 5.2 g/dL - - -  AST 13 - 35 U/L 35 33 26  ALT 7 - 35 U/L '14 17 10  '$ Alk Phosphatase 25 - 125 U/L 83 79 -  Total Bilirubin 0.3 - 1.2 mg/dL - - -    CBC Latest Ref Rng 12/13/2015 10/27/2015 07/14/2015  WBC - 5.2 3.3 4.4  Hemoglobin 12.0 - 16.0 g/dL 10.4(A) 11.1(A) 10.2(A)  Hematocrit 36 - 46 % 32(A) 37 33(A)  Platelets 150 - 399 K/L 186 142(A) 164   Lab Results  Component Value Date   TSH 1.58 12/13/2015   Lab Results  Component Value Date   MCV 94 06/19/2014   MCV 92.6 05/28/2013   MCV 92.6 05/27/2013    BNP    Component Value Date/Time   PROBNP 143.0* 02/17/2010 1311    Lipid Panel     Component Value Date/Time   CHOL 214* 12/13/2015   TRIG 57 12/13/2015   HDL 60 12/13/2015   CHOLHDL 3.6 02/05/2012 0550   VLDL 16 02/05/2012 0550   LDLCALC 143 12/13/2015     RADIOLOGY: No results found.    ASSESSMENT AND PLAN: Savannah. Niara Bunker is a  82 year old, African American female who had been very active prior to her disability secondary to her extensive stroke and continue to work at [employer]  prior to her last surgery.  She had rheumatic fever during childhood. She is 16 years status post St. Jude mitral valve replacement and 7 years status post bioprosthetic aortic valve replacement for development of severe aortic valvular stenosis. She had her initial pacemaker implanted in 2001. In August 2014 her pacemaker was end-of-life and essentially failed.  Presently, she denies any chest pain.  She denies any recent seizure disorder and is tolerating her Keppra.  She is well compensated without signs or symptoms of CHF.  She continues to have hemiparesis.  She was seen in December 2015 at which time her pacemaker was interrogated and she was seen by Dr. Sallyanne Kuster.  I reviewed her remote device checks.  Her most recent one was 2 days previously, which continues to show normal pacemaker function.  On exam, her valves appear crisp and it does not appear to be any significant stenosis to her bioprosthetic aortic valve.  With her significant debility and dementia.  She is not a candidate for redo surgery.  She is well compensated presently in would not recommend follow-up echo Doppler assessment.  There are no signs of edema.  On her current dose of furosemide.  She continues to be on Coumadin anticoagulation without bleeding.  He has GERD which is  controlled with omeprazole.  Her blood pressure today is stable on losartan 50 mg and furosemide 20 mg twice a day.  She has dementia and is on Aricept.  There is no further seizure activity.  She will continue with every three-month pacemaker remote monitoring.  As long she remains stable I will see her one year for reevaluation.  Time spent: 25 minutes  Troy Sine, MD, [facility]  02/03/2016 3:47 PM

## 2016-02-15 ENCOUNTER — Encounter: Payer: Self-pay | Admitting: Internal Medicine

## 2016-02-15 ENCOUNTER — Non-Acute Institutional Stay (SKILLED_NURSING_FACILITY): Payer: Medicare Other | Admitting: Internal Medicine

## 2016-02-15 DIAGNOSIS — I10 Essential (primary) hypertension: Secondary | ICD-10-CM

## 2016-02-15 DIAGNOSIS — I480 Paroxysmal atrial fibrillation: Secondary | ICD-10-CM

## 2016-02-15 DIAGNOSIS — N390 Urinary tract infection, site not specified: Secondary | ICD-10-CM

## 2016-02-15 DIAGNOSIS — Z7901 Long term (current) use of anticoagulants: Secondary | ICD-10-CM

## 2016-02-15 DIAGNOSIS — Z8673 Personal history of transient ischemic attack (TIA), and cerebral infarction without residual deficits: Secondary | ICD-10-CM

## 2016-02-15 NOTE — Progress Notes (Signed)
Patient ID: [name], female   DOB: 01/10/1923, 82 y.o.   MRN: [id number]      [facility]  Code Status: Full Code    Chief Complaint  Patient presents with  . Medical Management of Chronic Issues    Routine Visit    Allergies  Allergen Reactions  . Advil [Ibuprofen] Other (See Comments)    "think it just doesn't work for her"  . Alprazolam Other (See Comments)    "don't remember what happens"  . Novocain [Procaine]   . Relafen [Nabumetone] Other (See Comments)    "don't remember what happens"  . Sulfa Antibiotics Other (See Comments)    "don't remember what happens"  . Toprol Xl [Metoprolol Succinate] Other (See Comments)    25mg ; "don't remember what happens"   Advanced Directives 11/02/2015  Does patient have an advance directive? Yes  Type of Estate agent of [place];Living will  Does patient want to make changes to advanced directive? No - Patient declined  Copy of advanced directive(s) in chart? Yes  Pre-existing out of facility DNR order (yellow form or pink MOST form) -   Extended Emergency Contact Information Primary Emergency Contact: Chapman,Payge Address: [address]           [place], [place] [address] [place] Home Phone: [phone] Work Phone: [phone] Mobile Phone: [phone] Relation: Daughter    HPI 82 y/o female patient is seen for routine visit. She has been at her baseline. Reviewed BP reading with some low and some high BP reading. No new concern from staff. Patient does not participate in HPI and ROS. She has completed 1 week treatment with antibiotics for UTI.   Review of Systems: unable to participate with dementia  Past Medical History  Diagnosis Date  . Hypertension   . Hyperlipidemia   . Hypothyroidism   . Atrial fibrillation ([facility])   . Pacemaker   . Shortness of breath   . CHF (congestive heart failure) ([facility]) 05/27/2006; 05/30/2006; 11/06/2008  . Vascular dementia       "due to CVA 06/08/2009"  . Stroke ([facility]) 03/06/2001; 05/30/2009    "light stroke"  . Stroke ([facility]) 06/08/2009    "probable small TIA"  . Stroke ([facility]) 01/06/2002  . Angina 12/22/2003  . Ventilator dependent ([facility]) 06/08/2009    respiratory failure  . H/O: rheumatic fever     "childhood"  . Coronary artery disease   . Left atrial enlargement   . H/O cardiomegaly     diastolic  . Pulmonary hypertension ([facility])   . Anemia     "iron transfusions"  . Blood transfusion   . History of lower GI bleeding     "multiple; required blood transfusions"  . COPD (chronic obstructive pulmonary disease) ([facility])   . GERD (gastroesophageal reflux disease)   . H/O hiatal hernia   . H/O chronic bronchitis   . Asthma   . Arthritis   . Anxiety   . Depression   . Dysphagia as late effect of stroke   . Autoimmune hepatitis ([facility]) 07/12/1999  . Bilateral renal cysts     "2 large on right off the lower pole"  . Chronic renal insufficiency   . Pleural effusion     "moderate right & small left"  . Bacterial pneumonia 11/06/2008; 10/23/2009; 03/30/2010  . Avascular necrosis of femoral head ([facility])     right  . A-fib ([facility]) 01/04/2013  . Hx of atrioventricular node ablation 09/27/2014  Past Surgical History  Procedure Laterality Date  . Insert / replace / remove pacemaker  09/11/2000  . Av node ablation  "06/06/2005"  . Percutaneous tracheostomy  06/15/2009  . Electroencephalogram  01/28/2002; 11/26/2005; 10/23/2009  . Cardiac catheterization  06/14/1999; 12/26/2003; 05/30/2009  . Transcranial doppler & carotid ultrasound  11/28/2005  . Cardiac valve replacement  08/20/1999    "mitral valve replacement; St. Jude"  . Cardiac valve replacement  06/05/2009     "redo sternotomy & aortic valve replacement; tissue valve"  . Cauterized cecal av malformation    . Abdominal hysterectomy  1960's  . Dental implants removed  03/04/2001    lower  . Knee arthroscopy  12/26/2000    left  . Knee arthroscopy  07/29/2001    right  .  Pacemaker generator change N/A 05/24/2013    Procedure: PACEMAKER GENERATOR CHANGE;  Surgeon: Thurmon FairMihai Croitoru, MD;  Location: [facility] CATH LAB;  Service: Cardiovascular;  Laterality: N/A;    Medication reviewed.    Medication List       This list is accurate as of: 02/15/16  1:38 PM.  Always use your most recent med list.               acetaminophen 325 MG tablet  Commonly known as:  TYLENOL  Take 650 mg by mouth 3 (three) times daily.     aspirin 81 MG tablet  Take 81 mg by mouth daily.     Cholecalciferol 2000 units Caps  Take 2,000 Units by mouth daily.     docusate sodium 100 MG capsule  Commonly known as:  COLACE  Take 100 mg by mouth 2 (two) times daily.     donepezil 10 MG tablet  Commonly known as:  ARICEPT  Take 10 mg by mouth at bedtime.     ferrous sulfate 325 (65 FE) MG tablet  Take 325 mg by mouth 2 (two) times daily with a meal.     furosemide 20 MG tablet  Commonly known as:  LASIX  Take 20 mg by mouth 2 (two) times daily.     latanoprost 0.005 % ophthalmic solution  Commonly known as:  XALATAN  Place 1 drop into both eyes at bedtime.     levETIRAcetam 250 MG tablet  Commonly known as:  KEPPRA  Take 500 mg by mouth 2 (two) times daily.     levothyroxine 50 MCG tablet  Commonly known as:  SYNTHROID, LEVOTHROID  Take 50 mcg by mouth daily before breakfast. Check pulse weekly on Wednesday     losartan 25 MG tablet  Commonly known as:  COZAAR  Take 50 mg by mouth daily. Reported on 01/17/2016     omeprazole 20 MG capsule  Commonly known as:  PRILOSEC  Take 20 mg by mouth. Take every other day for GERD. Take on an empty stomach     OXYGEN  Inhale 2 L into the lungs.     oyster calcium 500 MG Tabs tablet  Take 500 mg of elemental calcium by mouth 2 (two) times daily.     senna 8.6 MG Tabs tablet  Commonly known as:  SENOKOT  Take 2 tablets by mouth at bedtime.     tiotropium 18 MCG inhalation capsule  Commonly known as:  SPIRIVA  Place 18 mcg into  inhaler and inhale daily. Inhale 1 capsule contents ( by taking 2 separate inhalations via handinhaler device)     traMADol 50 MG tablet  Commonly known as:  Janean SarkULTRAM  Take one tablet by mouth twice daily for OA/Pain     warfarin 7.5 MG tablet  Commonly known as:  COUMADIN  Take 7.5 mg by mouth daily.     WHITE PETROLATUM-MINERAL OIL OP  Apply 1 application to eye 2 (two) times daily. Wait 3-5 minutes between 2 eye medications       Immunization History  Administered Date(s) Administered  . Influenza Split 07/19/2014, 08/14/2014  . Influenza-Unspecified 08/03/2015  . PPD Test 12/20/2012, 07/01/2014, 06/21/2015  . Pneumococcal-Unspecified 07/19/2011    Physical exam BP 108/55 mmHg  Pulse 77  Temp(Src) 98.3 F (36.8 C) (Oral)  Resp 17  Ht 5' (1.524 m)  Wt 129 lb 8 oz (58.741 kg)  BMI 25.29 kg/m2  SpO2 98%  Wt Readings from Last 3 Encounters:  02/15/16 129 lb 8 oz (58.741 kg)  02/02/16 121 lb (54.885 kg)  01/29/16 126 lb 4.8 oz (57.289 kg)   Constitutional: Thin, frail, elderly female in no distress Head- atraumatic, normocephalic Eyes- PERRLA, EOMI, no pallor, no icterus, no discharge Mouth- normal mucus membrane Cardiovascular- normal s1,s2, + systolic ejection murmur, pacemaker to right upper chest, scar from previous sternotomy.  Respiratory- bilateral clear to auscultation, no wheeze, no rhonchi, no crackles, on o2 by nasal canula Abdomen- bowel sounds present, soft, non tender Musculoskeletal- left sided hemiparesis, left hand contracture, temporal muscle wasting Neurological- pleasantly confused, unable to assess Skin- warm and dry   Labs reviewed: CBC Latest Ref Rng 12/13/2015 10/27/2015 07/14/2015  WBC - 5.2 3.3 4.4  Hemoglobin 12.0 - 16.0 g/dL 10.4(A) 11.1(A) 10.2(A)  Hematocrit 36 - 46 % 32(A) 37 33(A)  Platelets 150 - 399 K/L 186 142(A) 164   CMP Latest Ref Rng 01/03/2016 12/13/2015 10/27/2015  Glucose 65-99 mg/dL - - -  BUN 4 - 21 mg/dL 16(X) 09(U) 04(V)    Creatinine 0.5 - 1.1 mg/dL 4.0(J) 1.1 8.1(X)  Sodium 137 - 147 mmol/L 144 137 141  Potassium 3.4 - 5.3 mmol/L 4.1 4.6 4.2  Chloride 98-107 mmol/L - - -  CO2 21-32 mmol/L - - -  Calcium 8.5-10.1 mg/dL - - -  Alkaline Phos 25 - 125 U/L 83 79 -  AST 13 - 35 U/L 35 33 -  ALT 7 - 35 U/L 14 17 -   Lipid Panel     Component Value Date/Time   CHOL 214* 12/13/2015   TRIG 57 12/13/2015   HDL 60 12/13/2015   CHOLHDL 3.6 02/05/2012 0550   VLDL 16 02/05/2012 0550   LDLCALC 143 12/13/2015   Lab Results  Component Value Date   TSH 1.58 12/13/2015   Lab Results  Component Value Date   HGBA1C 5.2 12/29/2015   04-07-15 vit d 58  Assessment/Plan  afib Rate controlled. Off rate controlling agent. Continue coumadin for anticoagulation  Long term anticoagulation inr 02/13/16 1.9. Continue coumadin 7.5 mg daily and check inr 02/20/16.   UTI Completed 1 week for rocephin for providencia UTI. Hydration and perineal care to be maintained  HTN Has low BP today and has few high bp reading. Check BP 3 days a week for a month and assess further. Continue losartan 50 mg daily for now  History of CVA Continue supportive care. Continue coumadin for stroke prophylaxis.     Oneal Grout, MD  [facility] [phone] (Monday-Friday 8 am - 5 pm) [phone] (afterhours)

## 2016-03-28 ENCOUNTER — Ambulatory Visit (INDEPENDENT_AMBULATORY_CARE_PROVIDER_SITE_OTHER): Payer: Medicare Other | Admitting: *Deleted

## 2016-03-28 ENCOUNTER — Telehealth: Payer: Self-pay | Admitting: Cardiology

## 2016-03-28 ENCOUNTER — Encounter: Payer: Medicare Other | Admitting: *Deleted

## 2016-03-28 DIAGNOSIS — I442 Atrioventricular block, complete: Secondary | ICD-10-CM | POA: Diagnosis not present

## 2016-03-28 NOTE — Telephone Encounter (Signed)
LMOVM reminding pt to send remote transmission.   

## 2016-03-29 ENCOUNTER — Encounter: Payer: Self-pay | Admitting: Cardiology

## 2016-04-02 ENCOUNTER — Telehealth: Payer: Self-pay | Admitting: Cardiovascular Disease

## 2016-04-02 LAB — CUP PACEART REMOTE DEVICE CHECK
Battery Remaining Longevity: 137 mo
Date Time Interrogation Session: 20170615060016
Implantable Lead Location: 753859
Lead Channel Pacing Threshold Pulse Width: 0.4 ms
Lead Channel Sensing Intrinsic Amplitude: 12 mV
Lead Channel Setting Pacing Pulse Width: 0.4 ms
Lead Channel Setting Sensing Sensitivity: 2 mV
MDC IDC LEAD IMPLANT DT: 20011129
MDC IDC LEAD IMPLANT DT: 20011129
MDC IDC LEAD LOCATION: 753860
MDC IDC MSMT BATTERY REMAINING PERCENTAGE: 95.5 %
MDC IDC MSMT BATTERY VOLTAGE: 3.01 V
MDC IDC MSMT LEADCHNL RV IMPEDANCE VALUE: 650 Ohm
MDC IDC MSMT LEADCHNL RV PACING THRESHOLD AMPLITUDE: 1 V
MDC IDC PG SERIAL: 7529661
MDC IDC SET LEADCHNL RV PACING AMPLITUDE: 1.25 V
MDC IDC STAT BRADY RV PERCENT PACED: 93 %
Pulse Gen Model: 2240

## 2016-04-02 NOTE — Telephone Encounter (Signed)
I spoke with Ms. Sibyl ParrChapman (patient's daughter) and informed her that the transmission did come through automatically overnight on 03/28/16. She verbalizes understanding and is very appreciative of call back.

## 2016-04-02 NOTE — Telephone Encounter (Signed)
New message     1. Has your device fired? no  2. Is you device beeping? no  3. Are you experiencing draining or swelling at device site? no  4. Are you calling to see if we received your device transmission? yes  5. Have you passed out? No   The daughter is calling concern about the letter she got cause the signal did not go through on June 15 th and wants to speak with Irving Burtonmily long

## 2016-04-03 ENCOUNTER — Encounter: Payer: Self-pay | Admitting: Cardiology

## 2016-04-03 NOTE — Progress Notes (Signed)
Remote pacemaker transmission.   

## 2016-04-24 ENCOUNTER — Encounter: Payer: Self-pay | Admitting: Internal Medicine

## 2016-04-24 ENCOUNTER — Non-Acute Institutional Stay (SKILLED_NURSING_FACILITY): Payer: Medicare Other | Admitting: Internal Medicine

## 2016-04-24 DIAGNOSIS — F0150 Vascular dementia without behavioral disturbance: Secondary | ICD-10-CM

## 2016-04-24 DIAGNOSIS — K117 Disturbances of salivary secretion: Secondary | ICD-10-CM | POA: Diagnosis not present

## 2016-04-24 DIAGNOSIS — F1120 Opioid dependence, uncomplicated: Secondary | ICD-10-CM | POA: Diagnosis not present

## 2016-04-24 DIAGNOSIS — K5901 Slow transit constipation: Secondary | ICD-10-CM

## 2016-04-24 NOTE — Progress Notes (Signed)
Patient ID: Savannah LyonsMary L [name], female   DOB: August 26, 1923, 82 y.o.   MRN: [id number]      [facility]  Code Status: Full Code    Chief Complaint  Patient presents with  . Medical Management of Chronic Issues    Routine Visit    Allergies  Allergen Reactions  . Advil [Ibuprofen] Other (See Comments)    "think it just doesn't work for her"  . Alprazolam Other (See Comments)    "don't remember what happens"  . Novocain [Procaine]   . Relafen [Nabumetone] Other (See Comments)    "don't remember what happens"  . Sulfa Antibiotics Other (See Comments)    "don't remember what happens"  . Toprol Xl [Metoprolol Succinate] Other (See Comments)    25mg ; "don't remember what happens"   Advanced Directives 11/02/2015  Does patient have an advance directive? Yes  Type of Estate agentAdvance Directive Healthcare Power of [place];Living will  Does patient want to make changes to advanced directive? No - Patient declined  Copy of advanced directive(s) in chart? Yes  Pre-existing out of facility DNR order (yellow form or pink MOST form) -   Extended Emergency Contact Information Primary Emergency Contact: Chapman,Matina Address: [address]           [place], [place] [address] [place] Home Phone: [phone] Work Phone: [phone] Mobile Phone: [phone] Relation: Daughter    HPI 82 y/o female patient is seen for routine visit. She denies any concern. No new concern from nursing staff. She is tolerating atropine drops well for her oral secretion. She continues to take her coumadin.   Review of Systems: unable to participate with dementia  Past Medical History  Diagnosis Date  . Hypertension   . Hyperlipidemia   . Hypothyroidism   . Atrial fibrillation (HCC)   . Pacemaker   . Shortness of breath   . CHF (congestive heart failure) (HCC) 05/27/2006; 05/30/2006; 11/06/2008  . Vascular dementia     "due to CVA 06/08/2009"  . Stroke (HCC) 03/06/2001; 05/30/2009    "light stroke"  . Stroke (HCC) 06/08/2009    "probable small TIA"  . Stroke (HCC) 01/06/2002  . Angina 12/22/2003  . Ventilator dependent (HCC) 06/08/2009    respiratory failure  . H/O: rheumatic fever     "childhood"  . Coronary artery disease   . Left atrial enlargement   . H/O cardiomegaly     diastolic  . Pulmonary hypertension (HCC)   . Anemia     "iron transfusions"  . Blood transfusion   . History of lower GI bleeding     "multiple; required blood transfusions"  . COPD (chronic obstructive pulmonary disease) (HCC)   . GERD (gastroesophageal reflux disease)   . H/O hiatal hernia   . H/O chronic bronchitis   . Asthma   . Arthritis   . Anxiety   . Depression   . Dysphagia as late effect of stroke   . Autoimmune hepatitis (HCC) 07/12/1999  . Bilateral renal cysts     "2 large on right off the lower pole"  . Chronic renal insufficiency   . Pleural effusion     "moderate right & small left"  . Bacterial pneumonia 11/06/2008; 10/23/2009; 03/30/2010  . Avascular necrosis of femoral head (HCC)     right  . A-fib (HCC) 01/04/2013  . Hx of atrioventricular node ablation 09/27/2014   Past Surgical History  Procedure Laterality Date  . Insert / replace / remove pacemaker  09/11/2000  . Av node ablation  "06/06/2005"  . Percutaneous tracheostomy  06/15/2009  . Electroencephalogram  01/28/2002; 11/26/2005; 10/23/2009  . Cardiac catheterization  06/14/1999; 12/26/2003; 05/30/2009  . Transcranial doppler & carotid ultrasound  11/28/2005  . Cardiac valve replacement  08/20/1999    "mitral valve replacement; [employer]"  . Cardiac valve replacement  06/05/2009     "redo sternotomy & aortic valve replacement; tissue valve"  . Cauterized cecal av malformation    . Abdominal hysterectomy  1960's  . Dental implants removed  03/04/2001    lower  . Knee arthroscopy  12/26/2000    left  . Knee arthroscopy  07/29/2001    right  . Pacemaker generator change N/A 05/24/2013    Procedure: PACEMAKER  GENERATOR CHANGE;  Surgeon: Thurmon Fair, MD;  Location: [facility]ATH [facility];  Service: Cardiovascular;  Laterality: N/A;    Medication reviewed.    Medication List       This list is accurate as of: 04/24/16  1:02 PM.  Always use your most recent med list.               acetaminophen 325 MG tablet  Commonly known as:  TYLENOL  Take 650 mg by mouth 3 (three) times daily.     aspirin 81 MG tablet  Take 81 mg by mouth daily.     atropine 1 % ophthalmic solution  Place 1 drop into both eyes every 4 (four) hours as needed.     Cholecalciferol 2000 units Caps  Take 2,000 Units by mouth daily.     docusate sodium 100 MG capsule  Commonly known as:  COLACE  Take 100 mg by mouth 2 (two) times daily.     donepezil 10 MG tablet  Commonly known as:  ARICEPT  Take 10 mg by mouth at bedtime.     furosemide 20 MG tablet  Commonly known as:  LASIX  Take 20 mg by mouth 2 (two) times daily.     latanoprost 0.005 % ophthalmic solution  Commonly known as:  XALATAN  Place 1 drop into both eyes at bedtime.     levETIRAcetam 250 MG tablet  Commonly known as:  KEPPRA  Take 500 mg by mouth 2 (two) times daily.     levothyroxine 50 MCG tablet  Commonly known as:  SYNTHROID, LEVOTHROID  Take 50 mcg by mouth daily before breakfast. Check pulse weekly on Wednesday     losartan 25 MG tablet  Commonly known as:  COZAAR  Take 50 mg by mouth daily. Reported on 01/17/2016     omeprazole 20 MG capsule  Commonly known as:  PRILOSEC  Take 20 mg by mouth. Take every other day for GERD. Take on an empty stomach     OXYGEN  Inhale 2 L into the lungs.     oyster calcium 500 MG Tabs tablet  Take 500 mg of elemental calcium by mouth 2 (two) times daily.     polyethylene glycol packet  Commonly known as:  MIRALAX / GLYCOLAX  Take 17 g by mouth daily.     traMADol 50 MG tablet  Commonly known as:  ULTRAM  Take one tablet by mouth twice daily for OA/Pain     warfarin 5 MG tablet  Commonly known  as:  COUMADIN  Take 5 mg by mouth daily. Monday/Wednesday/Friday     warfarin 6 MG tablet  Commonly known as:  COUMADIN  Take 6 mg by mouth daily. Tuesday/Thursday/Saturday/Sunday  WHITE PETROLATUM-MINERAL OIL OP  Apply 1 application to eye 2 (two) times daily. Wait 3-5 minutes between 2 eye medications       Immunization History  Administered Date(s) Administered  . Influenza Split 07/19/2014, 08/14/2014  . Influenza-Unspecified 08/03/2015  . PPD Test 12/20/2012, 07/01/2014, 06/21/2015  . Pneumococcal-Unspecified 07/19/2011    Physical exam BP 121/79 mmHg  Pulse 74  Temp(Src) 96.8 F (36 C) (Oral)  Ht 5' (1.524 m)  Wt 129 lb 8 oz (58.741 kg)  BMI 25.29 kg/m2  Wt Readings from Last 3 Encounters:  04/24/16 129 lb 8 oz (58.741 kg)  02/15/16 129 lb 8 oz (58.741 kg)  02/02/16 121 lb (54.885 kg)   Constitutional: Thin, frail, elderly female in no distress Head- atraumatic, normocephalic Eyes- no pallor, no icterus, no discharge Mouth- normal mucus membrane Cardiovascular- normal s1,s2, + systolic ejection murmur, pacemaker to right upper chest, scar from previous sternotomy.  Respiratory- bilateral clear to auscultation, no wheeze, no rhonchi, no crackles, on o2 by nasal canula Abdomen- bowel sounds present, soft, non tender Musculoskeletal- left sided hemiparesis, left hand contracture, temporal muscle wasting Neurological- pleasantly confused, unable to assess Skin- warm and dry   Labs reviewed: CBC Latest Ref Rng 12/13/2015 10/27/2015 07/14/2015  WBC - 5.2 3.3 4.4  Hemoglobin 12.0 - 16.0 g/dL 10.4(A) 11.1(A) 10.2(A)  Hematocrit 36 - 46 % 32(A) 37 33(A)  Platelets 150 - 399 K/L 186 142(A) 164   CMP Latest Ref Rng 01/03/2016 12/13/2015 10/27/2015  Glucose 65-99 mg/dL - - -  BUN 4 - 21 mg/dL 16(X) 09(U) 04(V)  Creatinine 0.5 - 1.1 mg/dL 4.0(J) 1.1 8.1(X)  Sodium 137 - 147 mmol/L 144 137 141  Potassium 3.4 - 5.3 mmol/L 4.1 4.6 4.2  Chloride 98-107 mmol/L - - -  CO2  21-32 mmol/L - - -  Calcium 8.5-10.1 mg/dL - - -  Alkaline Phos 25 - 125 U/L 83 79 -  AST 13 - 35 U/L 35 33 -  ALT 7 - 35 U/L 14 17 -   Lipid Panel     Component Value Date/Time   CHOL 214* 12/13/2015   TRIG 57 12/13/2015   HDL 60 12/13/2015   CHOLHDL 3.6 02/05/2012 0550   VLDL 16 02/05/2012 0550   LDLCALC 143 12/13/2015   Lab Results  Component Value Date   TSH 1.58 12/13/2015   Lab Results  Component Value Date   HGBA1C 5.2 12/29/2015   04-07-15 vit d 58  Assessment/Plan  Constipation Stable, continue daily miralax for now.   Oral secretion Continue atropine drops and monitor clinically  Vascular dementia Continue aricept and supportive care. She is on total care. Fall precautions. Pressure ulcer prophylaxis  Opioid dependence Pain under control, continue tramadol for her chronic severe OA pain. She is bed bound and under total care    Oneal Grout, MD  [facility] [phone] (Monday-Friday 8 am - 5 pm) [phone] (afterhours)

## 2016-05-23 ENCOUNTER — Non-Acute Institutional Stay (SKILLED_NURSING_FACILITY): Payer: Medicare Other | Admitting: Internal Medicine

## 2016-05-23 ENCOUNTER — Encounter: Payer: Self-pay | Admitting: Internal Medicine

## 2016-05-23 DIAGNOSIS — J9611 Chronic respiratory failure with hypoxia: Secondary | ICD-10-CM

## 2016-05-23 DIAGNOSIS — M24542 Contracture, left hand: Secondary | ICD-10-CM | POA: Diagnosis not present

## 2016-05-23 DIAGNOSIS — D6859 Other primary thrombophilia: Secondary | ICD-10-CM | POA: Insufficient documentation

## 2016-05-23 DIAGNOSIS — I482 Chronic atrial fibrillation, unspecified: Secondary | ICD-10-CM

## 2016-05-23 DIAGNOSIS — I25119 Atherosclerotic heart disease of native coronary artery with unspecified angina pectoris: Secondary | ICD-10-CM

## 2016-05-23 DIAGNOSIS — I739 Peripheral vascular disease, unspecified: Secondary | ICD-10-CM

## 2016-05-23 NOTE — Progress Notes (Signed)
Patient ID: [name], female   DOB: March 21, 1923, 82 y.o.   MRN: [id number]      [facility]  Code Status: Full Code    Chief Complaint  Patient presents with  . Medical Management of Chronic Issues    Routine Visit    Allergies  Allergen Reactions  . Advil [Ibuprofen] Other (See Comments)    "think it just doesn't work for her"  . Alprazolam Other (See Comments)    "don't remember what happens"  . Novocain [Procaine]   . Relafen [Nabumetone] Other (See Comments)    "don't remember what happens"  . Sulfa Antibiotics Other (See Comments)    "don't remember what happens"  . Toprol Xl [Metoprolol Succinate] Other (See Comments)    25mg ; "don't remember what happens"   Advanced Directives 11/02/2015  Does patient have an advance directive? Yes  Type of Estate agentAdvance Directive Healthcare Power of [place];Living will  Does patient want to make changes to advanced directive? No - Patient declined  Copy of advanced directive(s) in chart? Yes  Pre-existing out of facility DNR order (yellow form or pink MOST form) -   Extended Emergency Contact Information Primary Emergency Contact: Chapman,Ever Address: [address]           [place], [place] [address] [place] Home Phone: [phone] Work Phone: [phone] Mobile Phone: [phone] Relation: Daughter    HPI 82 y/o female patient is seen for routine visit. She has been at her baseline per nursing staff. No new concern from nursing staff. She does not provide HPI and ROS. She is OOB daily. She is under total care.   Review of Systems: unable to participate with dementia  Past Medical History:  Diagnosis Date  . A-fib (HCC) 01/04/2013  . Anemia    "iron transfusions"  . Angina 12/22/2003  . Anxiety   . Arthritis   . Asthma   . Atrial fibrillation (HCC)   . Autoimmune hepatitis (HCC) 07/12/1999  . Avascular necrosis of femoral head (HCC)    right  . Bacterial pneumonia 11/06/2008;  10/23/2009; 03/30/2010  . Bilateral renal cysts    "2 large on right off the lower pole"  . Blood transfusion   . CHF (congestive heart failure) (HCC) 05/27/2006; 05/30/2006; 11/06/2008  . Chronic renal insufficiency   . COPD (chronic obstructive pulmonary disease) (HCC)   . Coronary artery disease   . Depression   . Dysphagia as late effect of stroke   . GERD (gastroesophageal reflux disease)   . H/O cardiomegaly    diastolic  . H/O chronic bronchitis   . H/O hiatal hernia   . H/O: rheumatic fever    "childhood"  . History of lower GI bleeding    "multiple; required blood transfusions"  . Hx of atrioventricular node ablation 09/27/2014  . Hyperlipidemia   . Hypertension   . Hypothyroidism   . Left atrial enlargement   . Pacemaker   . Pleural effusion    "moderate right & small left"  . Pulmonary hypertension (HCC)   . Shortness of breath   . Stroke (HCC) 03/06/2001; 05/30/2009   "light stroke"  . Stroke (HCC) 06/08/2009   "probable small TIA"  . Stroke (HCC) 01/06/2002  . Vascular dementia    "due to CVA 06/08/2009"  . Ventilator dependent (HCC) 06/08/2009   respiratory failure   Past Surgical History:  Procedure Laterality Date  . ABDOMINAL HYSTERECTOMY  1960's  . AV NODE ABLATION  "06/06/2005"  .  CARDIAC CATHETERIZATION  06/14/1999; 12/26/2003; 05/30/2009  . CARDIAC VALVE REPLACEMENT  08/20/1999   "mitral valve replacement; [employer]. Jude"  . CARDIAC VALVE REPLACEMENT  06/05/2009    "redo sternotomy & aortic valve replacement; tissue valve"  . cauterized cecal av malformation    . dental implants removed  03/04/2001   lower  . electroencephalogram  01/28/2002; 11/26/2005; 10/23/2009  . INSERT / REPLACE / REMOVE PACEMAKER  09/11/2000  . KNEE ARTHROSCOPY  12/26/2000   left  . KNEE ARTHROSCOPY  07/29/2001   right  . PACEMAKER GENERATOR CHANGE N/A 05/24/2013   Procedure: PACEMAKER GENERATOR CHANGE;  Surgeon: Thurmon Fair, MD;  Location: [facility] CATH [facility];  Service: Cardiovascular;   Laterality: N/A;  . PERCUTANEOUS TRACHEOSTOMY  06/15/2009  . transcranial doppler & carotid ultrasound  11/28/2005    Medication reviewed.    Medication List       Accurate as of 05/23/16  2:53 PM. Always use your most recent med list.          acetaminophen 325 MG tablet Commonly known as:  TYLENOL Take 650 mg by mouth 3 (three) times daily.   aspirin 81 MG tablet Take 81 mg by mouth daily.   atropine 1 % ophthalmic solution Place 1 drop into both eyes every 4 (four) hours as needed.   Cholecalciferol 2000 units Caps Take 2,000 Units by mouth daily.   docusate sodium 100 MG capsule Commonly known as:  COLACE Take 100 mg by mouth 2 (two) times daily.   donepezil 10 MG tablet Commonly known as:  ARICEPT Take 10 mg by mouth at bedtime.   furosemide 20 MG tablet Commonly known as:  LASIX Take 20 mg by mouth 2 (two) times daily.   latanoprost 0.005 % ophthalmic solution Commonly known as:  XALATAN Place 1 drop into both eyes at bedtime.   levETIRAcetam 250 MG tablet Commonly known as:  KEPPRA Take 500 mg by mouth 2 (two) times daily.   levothyroxine 50 MCG tablet Commonly known as:  SYNTHROID, LEVOTHROID Take 50 mcg by mouth daily before breakfast. Check pulse weekly on Wednesday   losartan 25 MG tablet Commonly known as:  COZAAR Take 50 mg by mouth daily. Reported on 01/17/2016   omeprazole 20 MG capsule Commonly known as:  PRILOSEC Take 20 mg by mouth. Take every other day for GERD. Take on an empty stomach   OXYGEN Inhale 2 L into the lungs.   oyster calcium 500 MG Tabs tablet Take 500 mg of elemental calcium by mouth 2 (two) times daily.   polyethylene glycol packet Commonly known as:  MIRALAX / GLYCOLAX Take 17 g by mouth daily.   traMADol 50 MG tablet Commonly known as:  ULTRAM Take one tablet by mouth twice daily for OA/Pain   warfarin 6 MG tablet Commonly known as:  COUMADIN Take 6 mg by mouth daily.   WHITE PETROLATUM-MINERAL OIL OP Apply  1 application to eye 2 (two) times daily. Wait 3-5 minutes between 2 eye medications      Immunization History  Administered Date(s) Administered  . Influenza Split 07/19/2014, 08/14/2014  . Influenza-Unspecified 08/03/2015  . PPD Test 12/20/2012, 07/01/2014, 06/21/2015  . Pneumococcal-Unspecified 07/19/2011    Physical exam BP 140/81   Pulse 76   Temp 97.1 F (36.2 C) (Oral)   Resp 16   Ht 5\' 5"  (1.651 m)   Wt 127 lb 9.6 oz (57.9 kg)   SpO2 99%   BMI 21.23 kg/m   Wt Readings from Last  3 Encounters:  05/23/16 127 lb 9.6 oz (57.9 kg)  04/24/16 129 lb 8 oz (58.7 kg)  02/15/16 129 lb 8 oz (58.7 kg)   Constitutional: Thin, frail, elderly female in no distress Head- atraumatic, normocephalic Eyes- no pallor, no icterus, no discharge Mouth- normal mucus membrane Cardiovascular- normal s1,s2, + systolic ejection murmur, pacemaker to right upper chest, scar from previous sternotomy.  Respiratory- bilateral clear to auscultation, no wheeze, no rhonchi, no crackles, on o2 by nasal canula Abdomen- bowel sounds present, soft, non tender Musculoskeletal- left sided hemiparesis, left hand contracture, temporal muscle wasting, left arm brace in place Neurological- pleasantly confused, unable to assess Skin- warm and dry, has heel floaters   Labs reviewed: CBC Latest Ref Rng & Units 12/13/2015 10/27/2015 07/14/2015  WBC 10:3/mL 5.2 3.3 4.4  Hemoglobin 12.0 - 16.0 g/dL 10.4(A) 11.1(A) 10.2(A)  Hematocrit 36 - 46 % 32(A) 37 33(A)  Platelets 150 - 399 K/L 186 142(A) 164   CMP Latest Ref Rng & Units 01/03/2016 12/13/2015 10/27/2015  Glucose 65 - 99 mg/dL - - -  BUN 4 - 21 mg/dL 16(X) 09(U) 04(V)  Creatinine 0.5 - 1.1 mg/dL 4.0(J) 1.1 8.1(X)  Sodium 137 - 147 mmol/L 144 137 141  Potassium 3.4 - 5.3 mmol/L 4.1 4.6 4.2  Chloride 98 - 107 mmol/L - - -  CO2 21 - 32 mmol/L - - -  Calcium 8.5 - 10.1 mg/dL - - -  Total Protein 6.0 - 8.3 g/dL - - -  Total Bilirubin 0.3 - 1.2 mg/dL - - -    Alkaline Phos 25 - 125 U/L 83 79 -  AST 13 - 35 U/L 35 33 -  ALT 7 - 35 U/L 14 17 -   Lipid Panel     Component Value Date/Time   CHOL 214 (A) 12/13/2015   TRIG 57 12/13/2015   HDL 60 12/13/2015   CHOLHDL 3.6 02/05/2012 0550   VLDL 16 02/05/2012 0550   LDLCALC 143 12/13/2015   Lab Results  Component Value Date   TSH 1.58 12/13/2015   Lab Results  Component Value Date   HGBA1C 5.2 12/29/2015   04-07-15 vit d 58  Assessment/Plan  afib Rate controlled. Continue coumadin 6 mg daily for now and monitor inr.   Thrombophilia With her being on warfarin. Fall precautions. 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 Continue ehr splint for support and tylenol 650 mg tid for pain  PVD Continue her aspirin and to continue skin care  CAD No complaints of chest pain. Continue her aspirin 81 mg daily and losartan 50 mg daily with lasix.   Chronic respiratory failure With her copd and cardiac conditions. Continue o2 by nasal canula and monitor   Oneal Grout, MD Internal Medicine [facility] [address] [place], [place] [address] Cell Phone (Monday-Friday 8 am - 5 pm): [phone] On Call: [phone] and follow prompts after 5 pm and on weekends Office Phone: [phone] Office Fax: [phone]

## 2016-06-15 LAB — HEPATIC FUNCTION PANEL
ALK PHOS: 83 U/L (ref 25–125)
ALT: 12 U/L (ref 7–35)
AST: 24 U/L (ref 13–35)
Bilirubin, Total: 0.5 mg/dL

## 2016-06-15 LAB — BASIC METABOLIC PANEL
BUN: 21 mg/dL (ref 4–21)
CREATININE: 1.2 mg/dL — AB (ref 0.5–1.1)
Glucose: 88 mg/dL
Potassium: 4.1 mmol/L (ref 3.4–5.3)
Sodium: 140 mmol/L (ref 137–147)

## 2016-06-15 LAB — LIPID PANEL
CHOLESTEROL: 229 mg/dL — AB (ref 0–200)
HDL: 56 mg/dL (ref 35–70)
LDL Cholesterol: 158 mg/dL
Triglycerides: 72 mg/dL (ref 40–160)

## 2016-06-15 LAB — CBC AND DIFFERENTIAL
HCT: 35 % — AB (ref 36–46)
Hemoglobin: 11 g/dL — AB (ref 12.0–16.0)
PLATELETS: 215 10*3/uL (ref 150–399)
WBC: 6.5 10^3/mL

## 2016-06-15 LAB — TSH: TSH: 1.34 u[IU]/mL (ref 0.41–5.90)

## 2016-06-18 ENCOUNTER — Telehealth: Payer: Self-pay | Admitting: Cardiovascular Disease

## 2016-06-18 NOTE — Telephone Encounter (Signed)
New message     Seen local new for upgrades - [employer]. - cyber security updates - wants to wait until April to come back into the office please advise.

## 2016-06-18 NOTE — Telephone Encounter (Signed)
Spoke with patients daughter regarding the [employer]ulatory affairs officerJude cyber security firmware upgrade. I informed her that I was safe for her mother to wait until her scheduled appt in April and that at this time the risks outweigh the benefits of installing the upgrade. I explained that we would be contacting her if we determined it was necessary for her to come in at an earlier time to obtain this upgrade. She verbalized appreciation and will call with any additional questions.

## 2016-06-26 ENCOUNTER — Encounter: Payer: Self-pay | Admitting: Internal Medicine

## 2016-06-26 ENCOUNTER — Non-Acute Institutional Stay (SKILLED_NURSING_FACILITY): Payer: Medicare Other | Admitting: Internal Medicine

## 2016-06-26 DIAGNOSIS — K5900 Constipation, unspecified: Secondary | ICD-10-CM | POA: Diagnosis not present

## 2016-06-26 DIAGNOSIS — F329 Major depressive disorder, single episode, unspecified: Secondary | ICD-10-CM | POA: Diagnosis not present

## 2016-06-26 DIAGNOSIS — K219 Gastro-esophageal reflux disease without esophagitis: Secondary | ICD-10-CM | POA: Diagnosis not present

## 2016-06-26 NOTE — Progress Notes (Signed)
Patient ID: [name], female   DOB: 1923/01/14, 82 y.o.   MRN: [id number]      [facility]  Code Status: Full Code    Chief Complaint  Patient presents with  . Medical Management of Chronic Issues    Routine Visit    Allergies  Allergen Reactions  . Advil [Ibuprofen] Other (See Comments)    "think it just doesn't work for her"  . Alprazolam Other (See Comments)    "don't remember what happens"  . Novocain [Procaine]   . Relafen [Nabumetone] Other (See Comments)    "don't remember what happens"  . Sulfa Antibiotics Other (See Comments)    "don't remember what happens"  . Toprol Xl [Metoprolol Succinate] Other (See Comments)    25mg ; "don't remember what happens"   Advanced Directives 11/02/2015  Does patient have an advance directive? Yes  Type of Estate agent of [place];Living will  Does patient want to make changes to advanced directive? No - Patient declined  Copy of advanced directive(s) in chart? Yes  Pre-existing out of facility DNR order (yellow form or pink MOST form) -   Extended Emergency Contact Information Primary Emergency Contact: Chapman,Danyele Address: [address]           [place], [place] [address] [place] Home Phone: [phone] Work Phone: [phone] Mobile Phone: [phone] Relation: Daughter    HPI 82 y/o female patient is seen for routine visit. She has been at her baseline per nursing staff. No new concern from nursing staff. She does not provide HPI and ROS. She is OOB daily. She is under total care.   Review of Systems: unable to participate with dementia  Past Medical History:  Diagnosis Date  . A-fib (HCC) 01/04/2013  . Anemia    "iron transfusions"  . Angina 12/22/2003  . Anxiety   . Arthritis   . Asthma   . Atrial fibrillation (HCC)   . Autoimmune hepatitis (HCC) 07/12/1999  . Avascular necrosis of femoral head (HCC)    right  . Bacterial pneumonia 11/06/2008;  10/23/2009; 03/30/2010  . Bilateral renal cysts    "2 large on right off the lower pole"  . Blood transfusion   . CHF (congestive heart failure) (HCC) 05/27/2006; 05/30/2006; 11/06/2008  . Chronic renal insufficiency   . COPD (chronic obstructive pulmonary disease) (HCC)   . Coronary artery disease   . Depression   . Dysphagia as late effect of stroke   . GERD (gastroesophageal reflux disease)   . H/O cardiomegaly    diastolic  . H/O chronic bronchitis   . H/O hiatal hernia   . H/O: rheumatic fever    "childhood"  . History of lower GI bleeding    "multiple; required blood transfusions"  . Hx of atrioventricular node ablation 09/27/2014  . Hyperlipidemia   . Hypertension   . Hypothyroidism   . Left atrial enlargement   . Pacemaker   . Pleural effusion    "moderate right & small left"  . Pulmonary hypertension (HCC)   . Shortness of breath   . Stroke (HCC) 03/06/2001; 05/30/2009   "light stroke"  . Stroke (HCC) 06/08/2009   "probable small TIA"  . Stroke (HCC) 01/06/2002  . Vascular dementia    "due to CVA 06/08/2009"  . Ventilator dependent (HCC) 06/08/2009   respiratory failure   Past Surgical History:  Procedure Laterality Date  . ABDOMINAL HYSTERECTOMY  1960's  . AV NODE ABLATION  "06/06/2005"  .  CARDIAC CATHETERIZATION  06/14/1999; 12/26/2003; 05/30/2009  . CARDIAC VALVE REPLACEMENT  08/20/1999   "mitral valve replacement; [employer]. Jude"  . CARDIAC VALVE REPLACEMENT  06/05/2009    "redo sternotomy & aortic valve replacement; tissue valve"  . cauterized cecal av malformation    . dental implants removed  03/04/2001   lower  . electroencephalogram  01/28/2002; 11/26/2005; 10/23/2009  . INSERT / REPLACE / REMOVE PACEMAKER  09/11/2000  . KNEE ARTHROSCOPY  12/26/2000   left  . KNEE ARTHROSCOPY  07/29/2001   right  . PACEMAKER GENERATOR CHANGE N/A 05/24/2013   Procedure: PACEMAKER GENERATOR CHANGE;  Surgeon: Thurmon FairMihai Croitoru, MD;  Location: [facility];  Service: Cardiovascular;   Laterality: N/A;  . PERCUTANEOUS TRACHEOSTOMY  06/15/2009  . transcranial doppler & carotid ultrasound  11/28/2005    Medication reviewed.    Medication List       Accurate as of 06/26/16  2:55 PM. Always use your most recent med list.          acetaminophen 325 MG tablet Commonly known as:  TYLENOL Take 650 mg by mouth 3 (three) times daily.   aspirin 81 MG tablet Take 81 mg by mouth daily.   atropine 1 % ophthalmic solution Place 1 drop into both eyes every 4 (four) hours as needed.   Cholecalciferol 2000 units Caps Take 2,000 Units by mouth daily.   docusate sodium 100 MG capsule Commonly known as:  COLACE Take 100 mg by mouth 2 (two) times daily.   donepezil 10 MG tablet Commonly known as:  ARICEPT Take 10 mg by mouth at bedtime.   furosemide 20 MG tablet Commonly known as:  LASIX Take 20 mg by mouth 2 (two) times daily.   latanoprost 0.005 % ophthalmic solution Commonly known as:  XALATAN Place 1 drop into both eyes at bedtime.   levETIRAcetam 250 MG tablet Commonly known as:  KEPPRA Take 500 mg by mouth 2 (two) times daily.   levothyroxine 50 MCG tablet Commonly known as:  SYNTHROID, LEVOTHROID Take 50 mcg by mouth daily before breakfast. Check pulse weekly on Wednesday   losartan 25 MG tablet Commonly known as:  COZAAR Take 50 mg by mouth daily. Reported on 01/17/2016   omeprazole 20 MG capsule Commonly known as:  PRILOSEC Take 20 mg by mouth. Take every other day for GERD. Take on an empty stomach   OXYGEN Inhale 2 L into the lungs.   oyster calcium 500 MG Tabs tablet Take 500 mg of elemental calcium by mouth 2 (two) times daily.   polyethylene glycol packet Commonly known as:  MIRALAX / GLYCOLAX Take 17 g by mouth daily.   traMADol 50 MG tablet Commonly known as:  ULTRAM Take one tablet by mouth twice daily for OA/Pain   warfarin 6 MG tablet Commonly known as:  COUMADIN Take 6 mg by mouth daily.   WHITE PETROLATUM-MINERAL OIL OP Apply  1 application to eye 2 (two) times daily. Wait 3-5 minutes between 2 eye medications      Immunization History  Administered Date(s) Administered  . Influenza Split 07/19/2014, 08/14/2014  . Influenza-Unspecified 08/03/2015  . PPD Test 12/20/2012, 07/01/2014, 06/21/2015  . Pneumococcal-Unspecified 07/19/2011    Physical exam BP (!) 155/67   Pulse 79   Temp 97.4 F (36.3 C) (Oral)   Resp 16   Ht 5\' 5"  (1.651 m)   Wt 127 lb 12.8 oz (58 kg)   SpO2 98%   BMI 21.27 kg/m   Wt Readings from  Last 3 Encounters:  06/26/16 127 lb 12.8 oz (58 kg)  05/23/16 127 lb 9.6 oz (57.9 kg)  04/24/16 129 lb 8 oz (58.7 kg)   Constitutional: Thin, frail, elderly female in no distress Head- atraumatic, normocephalic Eyes- no pallor, no icterus, no discharge Mouth- normal mucus membrane Cardiovascular- normal s1,s2, + systolic ejection murmur, pacemaker to right upper chest, scar from previous sternotomy.  Respiratory- bilateral clear to auscultation, no wheeze, no rhonchi, no crackles, on o2 by nasal canula Abdomen- bowel sounds present, soft, non tender Musculoskeletal- left sided hemiparesis, left hand contracture, temporal muscle wasting, left arm brace in place Neurological- pleasantly confused, unable to assess Skin- warm and dry, has heel floaters   Labs reviewed: CBC Latest Ref Rng & Units 06/15/2016 12/13/2015 10/27/2015  WBC 10:3/mL 6.5 5.2 3.3  Hemoglobin 12.0 - 16.0 g/dL 11.0(A) 10.4(A) 11.1(A)  Hematocrit 36 - 46 % 35(A) 32(A) 37  Platelets 150 - 399 K/L 215 186 142(A)   CMP Latest Ref Rng & Units 06/15/2016 01/03/2016 12/13/2015  Glucose 65 - 99 mg/dL - - -  BUN 4 - 21 mg/dL 21 16(X) 09(U)  Creatinine 0.5 - 1.1 mg/dL 1.2(A) 1.5(A) 1.1  Sodium 137 - 147 mmol/L 140 144 137  Potassium 3.4 - 5.3 mmol/L 4.1 4.1 4.6  Chloride 98 - 107 mmol/L - - -  CO2 21 - 32 mmol/L - - -  Calcium 8.5 - 10.1 mg/dL - - -  Total Protein 6.0 - 8.3 g/dL - - -  Total Bilirubin 0.3 - 1.2 mg/dL - - -    Alkaline Phos 25 - 125 U/L 83 83 79  AST 13 - 35 U/L 24 35 33  ALT 7 - 35 U/L 12 14 17    Lipid Panel     Component Value Date/Time   CHOL 229 (A) 06/15/2016   TRIG 72 06/15/2016   HDL 56 06/15/2016   CHOLHDL 3.6 02/05/2012 0550   VLDL 16 02/05/2012 0550   LDLCALC 158 06/15/2016   Lab Results  Component Value Date   TSH 1.34 06/15/2016   Lab Results  Component Value Date   HGBA1C 5.2 12/29/2015   04-07-15 vit d 58  Assessment/Plan  Constipation Continue miralax and monitor her bowel movement. To avoid ileus  gerd No esophagitis. On coumadin, continue prilosec 20 mg qod for now  Chronic depression Present and her dementia is mainly contributing to this. Difficult to fully assess her current mood. Off celexa now. monitor   Oneal Grout, MD Internal Medicine [facility] [address] [place], [place] [address] Cell Phone (Monday-Friday 8 am - 5 pm): [phone] On Call: [phone] and follow prompts after 5 pm and on weekends Office Phone: [phone] Office Fax: [phone]

## 2016-07-01 ENCOUNTER — Encounter: Payer: Medicare Other | Admitting: *Deleted

## 2016-07-01 ENCOUNTER — Telehealth: Payer: Self-pay | Admitting: Cardiology

## 2016-07-01 NOTE — Telephone Encounter (Signed)
LMOVM reminding pt to send remote transmission.   

## 2016-07-05 ENCOUNTER — Encounter: Payer: Self-pay | Admitting: Cardiology

## 2016-07-09 ENCOUNTER — Telehealth: Payer: Self-pay | Admitting: Cardiovascular Disease

## 2016-07-09 NOTE — Telephone Encounter (Signed)
New message      Pt got a message on mychart saying her pacemaker check did not go thru.  Do you want her to send it again or should she call the pacemaker company?

## 2016-07-09 NOTE — Telephone Encounter (Signed)
LMTCB/sss  Patient/EC needs to send manual transmission.

## 2016-07-10 NOTE — Telephone Encounter (Signed)
error 

## 2016-07-10 NOTE — Telephone Encounter (Signed)
Spoke w/ pt daughter and informed her how to send a manual transmission. Pt daughter verbalized understanding.

## 2016-07-11 ENCOUNTER — Telehealth: Payer: Self-pay | Admitting: Cardiovascular Disease

## 2016-07-11 NOTE — Telephone Encounter (Signed)
New Message  Pts daughter voiced she was having a problem trying to submit transmission while at nursing home where mother is located.  Pts daughter voiced for someone to reach out to her with instructions.  Please f/u

## 2016-07-12 NOTE — Telephone Encounter (Signed)
Spoke with patients daughter. I walked her through steps on how to send a manual transmission. I additionally gave her tech services number for [employer] if she was still unable to send a transmission. She stated that she will attempt again on Sunday and call back Monday if she has any other questions.

## 2016-07-16 ENCOUNTER — Telehealth: Payer: Self-pay | Admitting: Cardiovascular Disease

## 2016-07-16 NOTE — Telephone Encounter (Signed)
F/u Message  Pt call requesting to speak with RN to f/u with [employer] to see if transmission was received. Pt daughter ask to speak with [employer] only. Please call back to discuss

## 2016-07-16 NOTE — Telephone Encounter (Signed)
Notified patients daughter that we received transmission. We will check her device again remotely on 10/15/2016

## 2016-07-24 ENCOUNTER — Non-Acute Institutional Stay (SKILLED_NURSING_FACILITY): Payer: Medicare Other | Admitting: Internal Medicine

## 2016-07-24 ENCOUNTER — Encounter: Payer: Self-pay | Admitting: Internal Medicine

## 2016-07-24 DIAGNOSIS — N183 Chronic kidney disease, stage 3 unspecified: Secondary | ICD-10-CM

## 2016-07-24 DIAGNOSIS — E7800 Pure hypercholesterolemia, unspecified: Secondary | ICD-10-CM | POA: Diagnosis not present

## 2016-07-24 DIAGNOSIS — E890 Postprocedural hypothyroidism: Secondary | ICD-10-CM | POA: Diagnosis not present

## 2016-07-24 NOTE — Progress Notes (Signed)
Patient ID: [name], female   DOB: [birth year], 82 y.o.   MRN: [id number]      [facility]  Code Status: Full Code    Chief Complaint  Patient presents with  . Medical Management of Chronic Issues    Routine Visit    Allergies  Allergen Reactions  . Advil [Ibuprofen] Other (See Comments)    "think it just doesn't work for her"  . Alprazolam Other (See Comments)    "don't remember what happens"  . Novocain [Procaine]   . Relafen [Nabumetone] Other (See Comments)    "don't remember what happens"  . Sulfa Antibiotics Other (See Comments)    "don't remember what happens"  . Toprol Xl [Metoprolol Succinate] Other (See Comments)    25mg ; "don't remember what happens"   Advanced Directives 11/02/2015  Does patient have an advance directive? Yes  Type of Estate agent of [place];Living will  Does patient want to make changes to advanced directive? No - Patient declined  Copy of advanced directive(s) in chart? Yes  Pre-existing out of facility DNR order (yellow form or pink MOST form) -   Extended Emergency Contact Information Primary Emergency Contact: Chapman,Irvin Address: [address]           [place], [place] [address] [place] Home Phone: [phone] Work Phone: [phone] Mobile Phone: [phone] Relation: Daughter    HPI 82 y/o female patient is seen for routine visit. She has been at her baseline per nursing staff. No new concern from nursing staff.   Review of Systems: unable to participate with dementia. She is under total care  Past Medical History:  Diagnosis Date  . A-fib (HCC) 01/04/2013  . Anemia    "iron transfusions"  . Angina 12/22/2003  . Anxiety   . Arthritis   . Asthma   . Atrial fibrillation (HCC)   . Autoimmune hepatitis (HCC) 07/12/1999  . Avascular necrosis of femoral head (HCC)    right  . Bacterial pneumonia 11/06/2008; 10/23/2009; 03/30/2010  . Bilateral renal cysts    "2  large on right off the lower pole"  . Blood transfusion   . CHF (congestive heart failure) (HCC) 05/27/2006; 05/30/2006; 11/06/2008  . Chronic renal insufficiency   . COPD (chronic obstructive pulmonary disease) (HCC)   . Coronary artery disease   . Depression   . Dysphagia as late effect of stroke   . GERD (gastroesophageal reflux disease)   . H/O cardiomegaly    diastolic  . H/O chronic bronchitis   . H/O hiatal hernia   . H/O: rheumatic fever    "childhood"  . History of lower GI bleeding    "multiple; required blood transfusions"  . Hx of atrioventricular node ablation 09/27/2014  . Hyperlipidemia   . Hypertension   . Hypothyroidism   . Left atrial enlargement   . Pacemaker   . Pleural effusion    "moderate right & small left"  . Pulmonary hypertension   . Shortness of breath   . Stroke (HCC) 03/06/2001; 05/30/2009   "light stroke"  . Stroke (HCC) 06/08/2009   "probable small TIA"  . Stroke (HCC) 01/06/2002  . Vascular dementia    "due to CVA 06/08/2009"  . Ventilator dependent (HCC) 06/08/2009   respiratory failure   Past Surgical History:  Procedure Laterality Date  . ABDOMINAL HYSTERECTOMY  1960's  . AV NODE ABLATION  "06/06/2005"  . CARDIAC CATHETERIZATION  06/14/1999; 12/26/2003; 05/30/2009  . CARDIAC VALVE REPLACEMENT  08/20/1999   "mitral valve replacement; [employer]. Jude"  . CARDIAC VALVE REPLACEMENT  06/05/2009    "redo sternotomy & aortic valve replacement; tissue valve"  . cauterized cecal av malformation    . dental implants removed  03/04/2001   lower  . electroencephalogram  01/28/2002; 11/26/2005; 10/23/2009  . INSERT / REPLACE / REMOVE PACEMAKER  09/11/2000  . KNEE ARTHROSCOPY  12/26/2000   left  . KNEE ARTHROSCOPY  07/29/2001   right  . PACEMAKER GENERATOR CHANGE N/A 05/24/2013   Procedure: PACEMAKER GENERATOR CHANGE;  Surgeon: Thurmon Fair, MD;  Location: [facility];  Service: Cardiovascular;  Laterality: N/A;  . PERCUTANEOUS TRACHEOSTOMY  06/15/2009  .  transcranial doppler & carotid ultrasound  11/28/2005    Medication reviewed.    Medication List       Accurate as of 07/24/16  2:16 PM. Always use your most recent med list.          acetaminophen 325 MG tablet Commonly known as:  TYLENOL Take 650 mg by mouth 3 (three) times daily.   aspirin 81 MG tablet Take 81 mg by mouth daily.   atropine 1 % ophthalmic solution Place 1 drop into both eyes every 4 (four) hours as needed.   Cholecalciferol 2000 units Caps Take 2,000 Units by mouth daily.   docusate sodium 100 MG capsule Commonly known as:  COLACE Take 100 mg by mouth 2 (two) times daily.   donepezil 10 MG tablet Commonly known as:  ARICEPT Take 10 mg by mouth at bedtime.   furosemide 20 MG tablet Commonly known as:  LASIX Take 20 mg by mouth 2 (two) times daily.   latanoprost 0.005 % ophthalmic solution Commonly known as:  XALATAN Place 1 drop into both eyes at bedtime.   levETIRAcetam 250 MG tablet Commonly known as:  KEPPRA Take 500 mg by mouth 2 (two) times daily.   levothyroxine 50 MCG tablet Commonly known as:  SYNTHROID, LEVOTHROID Take 50 mcg by mouth daily before breakfast. Check pulse weekly on Wednesday   losartan 25 MG tablet Commonly known as:  COZAAR Take 50 mg by mouth daily. Reported on 01/17/2016   omeprazole 20 MG capsule Commonly known as:  PRILOSEC Take 20 mg by mouth. Take every other day for GERD. Take on an empty stomach   OXYGEN Inhale 2 L into the lungs.   oyster calcium 500 MG Tabs tablet Take 500 mg of elemental calcium by mouth 2 (two) times daily.   polyethylene glycol packet Commonly known as:  MIRALAX / GLYCOLAX Take 17 g by mouth daily.   traMADol 50 MG tablet Commonly known as:  ULTRAM Take one tablet by mouth twice daily for OA/Pain   warfarin 6 MG tablet Commonly known as:  COUMADIN Take 6 mg by mouth daily.   WHITE PETROLATUM-MINERAL OIL OP Apply 1 application to eye 2 (two) times daily. Wait 3-5 minutes  between 2 eye medications      Immunization History  Administered Date(s) Administered  . Influenza Split 07/19/2014, 08/14/2014  . Influenza-Unspecified 08/03/2015  . PPD Test 12/20/2012, 07/01/2014, 06/21/2015  . Pneumococcal-Unspecified 07/19/2011    Physical exam BP 121/71   Pulse 75   Temp 97.7 F (36.5 C) (Oral)   Resp 16   Ht 5' (1.524 m)   Wt 127 lb 12.8 oz (58 kg)   BMI 24.96 kg/m   Wt Readings from Last 3 Encounters:  07/24/16 127 lb 12.8 oz (58 kg)  06/26/16 127 lb 12.8 oz (58  kg)  05/23/16 127 lb 9.6 oz (57.9 kg)   Constitutional: Thin, frail, elderly female in no distress Head- atraumatic, normocephalic Cardiovascular- normal s1,s2, + systolic ejection murmur, pacemaker to right upper chest, scar from previous sternotomy.  Respiratory- bilateral clear to auscultation, no wheeze, no rhonchi, no crackles, on o2 by nasal canula Abdomen- bowel sounds present, soft, non tender Musculoskeletal- left sided hemiparesis, left hand contracture, temporal muscle wasting, left arm brace in place Skin- warm and dry, has heel floaters   Labs reviewed: CBC Latest Ref Rng & Units 06/15/2016 12/13/2015 10/27/2015  WBC 10:3/mL 6.5 5.2 3.3  Hemoglobin 12.0 - 16.0 g/dL 11.0(A) 10.4(A) 11.1(A)  Hematocrit 36 - 46 % 35(A) 32(A) 37  Platelets 150 - 399 K/L 215 186 142(A)   CMP Latest Ref Rng & Units 06/15/2016 01/03/2016 12/13/2015  Glucose 65 - 99 mg/dL - - -  BUN 4 - 21 mg/dL 21 16(X39(A) 09(U27(A)  Creatinine 0.5 - 1.1 mg/dL 1.2(A) 1.5(A) 1.1  Sodium 137 - 147 mmol/L 140 144 137  Potassium 3.4 - 5.3 mmol/L 4.1 4.1 4.6  Chloride 98 - 107 mmol/L - - -  CO2 21 - 32 mmol/L - - -  Calcium 8.5 - 10.1 mg/dL - - -  Total Protein 6.0 - 8.3 g/dL - - -  Total Bilirubin 0.3 - 1.2 mg/dL - - -  Alkaline Phos 25 - 125 U/L 83 83 79  AST 13 - 35 U/L 24 35 33  ALT 7 - 35 U/L 12 14 17    Lipid Panel     Component Value Date/Time   CHOL 229 (A) 06/15/2016   TRIG 72 06/15/2016   HDL 56 06/15/2016    CHOLHDL 3.6 02/05/2012 0550   VLDL 16 02/05/2012 0550   LDLCALC 158 06/15/2016   Lab Results  Component Value Date   TSH 1.34 06/15/2016   Lab Results  Component Value Date   HGBA1C 5.2 12/29/2015   04-07-15 vit d 58  Assessment/Plan  Hyperlipidemia Off statin given ehr age and dementia. Monitor clinically  Post surgical Hypothyroidism tsh reviewed. Continue ehr levothyroxine 50 mcg daily  ckd stage 3 Monitor bmp, continue her antihypertensive losartan and lasix.    Oneal GroutMAHIMA Fusae Florio, MD Internal Medicine [facility] [address] [place], [place] [address] Cell Phone (Monday-Friday 8 am - 5 pm): [phone] On Call: [phone] and follow prompts after 5 pm and on weekends Office Phone: [phone] Office Fax: [phone]

## 2016-08-30 ENCOUNTER — Other Ambulatory Visit: Payer: Self-pay

## 2016-08-30 MED ORDER — TRAMADOL HCL 50 MG PO TABS: ORAL_TABLET | ORAL | 5 refills | Status: DC

## 2016-08-30 NOTE — Telephone Encounter (Signed)
Rx faxed to [facility] @ [phone], phone number [phone]  

## 2016-09-07 LAB — BASIC METABOLIC PANEL
BUN: 145 mg/dL — AB (ref 4–21)
CREATININE: 4.7 mg/dL — AB (ref 0.5–1.1)
Glucose: 106 mg/dL
POTASSIUM: 3.9 mmol/L (ref 3.4–5.3)
SODIUM: 163 mmol/L — AB (ref 137–147)

## 2016-09-11 ENCOUNTER — Non-Acute Institutional Stay (SKILLED_NURSING_FACILITY): Payer: Medicare Other | Admitting: Family

## 2016-09-11 DIAGNOSIS — I5022 Chronic systolic (congestive) heart failure: Secondary | ICD-10-CM | POA: Diagnosis not present

## 2016-09-11 DIAGNOSIS — K5901 Slow transit constipation: Secondary | ICD-10-CM | POA: Diagnosis not present

## 2016-09-11 DIAGNOSIS — R638 Other symptoms and signs concerning food and fluid intake: Secondary | ICD-10-CM | POA: Diagnosis not present

## 2016-09-11 DIAGNOSIS — J449 Chronic obstructive pulmonary disease, unspecified: Secondary | ICD-10-CM

## 2016-09-11 DIAGNOSIS — I110 Hypertensive heart disease with heart failure: Secondary | ICD-10-CM | POA: Diagnosis not present

## 2016-09-11 DIAGNOSIS — I482 Chronic atrial fibrillation, unspecified: Secondary | ICD-10-CM

## 2016-09-11 NOTE — Progress Notes (Signed)
Location:  [facility] Room Number: 305  Place of Service:  SNF (31) Provider: Laticia Vannostrand FNP-C   Oneal GroutPANDEY, MAHIMA, MD  Patient Care Team: Oneal GroutMahima Pandey, MD as PCP - General (Internal Medicine)  Extended Emergency Contact Information Primary Emergency Contact: Chapman,Adeli Address: [address]           [place], [place] [address] [place] Home Phone: [phone] Work Phone: [phone] Mobile Phone: [phone] Relation: Daughter  Code Status: DNR  Goals of care: Advanced Directive information Advanced Directives 11/02/2015  Does Patient Have a Medical Advance Directive? Yes  Type of Estate agentAdvance Directive Healthcare Power of [place];Living will  Does patient want to make changes to medical advance directive? No - Patient declined  Copy of Healthcare Power of Attorney in Chart? Yes  Pre-existing out of facility DNR order (yellow form or pink MOST form) -     Chief Complaint  Patient presents with  . Medical Management of Chronic Issues    HPI:  Pt is a 82 y.o. female [facility] for medical management of chronic diseases.She has a medical history of HTN,AFib,CVA Pacemaker, Advance dementia, long anticoagulant use among other conditions. She was previously managed by [facility] Provider Levora DredgeBeth Clark NP but currently on Hospice services due to progressive decline in condition. She is seen in her room today with her daughter at bedside. She states patient has been non-verbal and unable eat or drink.She has had decreased urine output. LBM 09/10/2016.Most of her medication was recent discontinued by Hospice service due to inability to swallow.Facility Nurse reports right hand jerking and restlessness at times Ativan given with relief.    Past Medical History:  Diagnosis Date  . A-fib (HCC) 01/04/2013  . Anemia    "iron transfusions"  . Angina 12/22/2003  . Anxiety   . Arthritis   . Asthma   . Atrial  fibrillation (HCC)   . Autoimmune hepatitis (HCC) 07/12/1999  . Avascular necrosis of femoral head (HCC)    right  . Bacterial pneumonia 11/06/2008; 10/23/2009; 03/30/2010  . Bilateral renal cysts    "2 large on right off the lower pole"  . Blood transfusion   . CHF (congestive heart failure) (HCC) 05/27/2006; 05/30/2006; 11/06/2008  . Chronic renal insufficiency   . COPD (chronic obstructive pulmonary disease) (HCC)   . Coronary artery disease   . Depression   . Dysphagia as late effect of stroke   . GERD (gastroesophageal reflux disease)   . H/O cardiomegaly    diastolic  . H/O chronic bronchitis   . H/O hiatal hernia   . H/O: rheumatic fever    "childhood"  . History of lower GI bleeding    "multiple; required blood transfusions"  . Hx of atrioventricular node ablation 09/27/2014  . Hyperlipidemia   . Hypertension   . Hypothyroidism   . Left atrial enlargement   . Pacemaker   . Pleural effusion    "moderate right & small left"  . Pulmonary hypertension   . Shortness of breath   . Stroke (HCC) 03/06/2001; 05/30/2009   "light stroke"  . Stroke (HCC) 06/08/2009   "probable small TIA"  . Stroke (HCC) 01/06/2002  . Vascular dementia    "due to CVA 06/08/2009"  . Ventilator dependent (HCC) 06/08/2009   respiratory failure   Past Surgical History:  Procedure Laterality Date  . ABDOMINAL HYSTERECTOMY  1960's  . AV NODE ABLATION  "06/06/2005"  . CARDIAC CATHETERIZATION  06/14/1999; 12/26/2003; 05/30/2009  . CARDIAC VALVE REPLACEMENT  08/20/1999   "mitral valve replacement; St. Jude"  . CARDIAC VALVE REPLACEMENT  06/05/2009    "redo sternotomy & aortic valve replacement; tissue valve"  . cauterized cecal av malformation    . dental implants removed  03/04/2001   lower  . electroencephalogram  01/28/2002; 11/26/2005; 10/23/2009  . INSERT / REPLACE / REMOVE PACEMAKER  09/11/2000  . KNEE ARTHROSCOPY  12/26/2000   left  . KNEE ARTHROSCOPY  07/29/2001   right  . PACEMAKER GENERATOR CHANGE  N/A 05/24/2013   Procedure: PACEMAKER GENERATOR CHANGE;  Surgeon: Thurmon Fair, MD;  Location: [facility] CATH LAB;  Service: Cardiovascular;  Laterality: N/A;  . PERCUTANEOUS TRACHEOSTOMY  06/15/2009  . transcranial doppler & carotid ultrasound  11/28/2005    Allergies  Allergen Reactions  . Advil [Ibuprofen] Other (See Comments)    "think it just doesn't work for her"  . Alprazolam Other (See Comments)    "don't remember what happens"  . Novocain [Procaine]   . Relafen [Nabumetone] Other (See Comments)    "don't remember what happens"  . Sulfa Antibiotics Other (See Comments)    "don't remember what happens"  . Toprol Xl [Metoprolol Succinate] Other (See Comments)    25mg ; "don't remember what happens"      Medication List       Accurate as of 09/11/16  6:11 PM. Always use your most recent med list.          acetaminophen 325 MG tablet Commonly known as:  TYLENOL Take 650 mg by mouth 3 (three) times daily.   aspirin 81 MG tablet Take 81 mg by mouth daily.   atropine 1 % ophthalmic solution Place 1 drop into both eyes every 4 (four) hours as needed.   docusate sodium 100 MG capsule Commonly known as:  COLACE Take 100 mg by mouth 2 (two) times daily.   OXYGEN Inhale 2 L into the lungs.   polyethylene glycol packet Commonly known as:  MIRALAX / GLYCOLAX Take 17 g by mouth daily.   traMADol 50 MG tablet Commonly known as:  ULTRAM Take one tablet by mouth twice daily for OA/Pain   warfarin 6 MG tablet Commonly known as:  COUMADIN Take 6 mg by mouth daily.   WHITE PETROLATUM-MINERAL OIL OP Apply 1 application to eye 2 (two) times daily. Wait 3-5 minutes between 2 eye medications       Review of Systems  Unable to perform ROS: Patient nonverbal    Immunization History  Administered Date(s) Administered  . Influenza Split 07/19/2014, 08/14/2014  . Influenza-Unspecified 08/03/2015  . PPD Test 12/20/2012, 07/01/2014, 06/21/2015  . Pneumococcal-Unspecified  07/19/2011   Pertinent  Health Maintenance Due  Topic Date Due  . PNA vac Low Risk Adult (2 of 2 - PCV13) 10/14/2016 (Originally 07/18/2012)  . INFLUENZA VACCINE  10/14/2017 (Originally 05/14/2016)  . DEXA SCAN  10/15/2023 (Originally 06/05/1990)   Fall Risk  01/17/2016  Falls in the past year? No      There were no vitals filed for this visit. There is no height or weight on file to calculate BMI. Physical Exam  Constitutional:  Frail Elderly in bed with eyes closed in no acute distress. Moves right hand occasionally.   HENT:  Head: Normocephalic.  Mouth/Throat: Oropharynx is clear and moist. No oropharyngeal exudate.  Eyes: Right eye exhibits no discharge. Left eye exhibits no discharge. No scleral icterus.  Neck: Neck supple. No JVD present.  Cardiovascular: Exam reveals  no gallop and no friction rub.   Murmur heard. Right chest pace maker   Pulmonary/Chest: Effort normal. No respiratory distress. She has no wheezes. She has no rales.  Diminished bases unable to take deep breaths. Oxygen via [place] in place.   Abdominal: Soft. Bowel sounds are normal. She exhibits no distension. There is no tenderness. There is no rebound and no guarding.  Genitourinary:  Genitourinary Comments: Incontinent for both bowel and bladder.   Musculoskeletal: She exhibits no edema or tenderness.  Left side paralysis   Lymphadenopathy:    She has no cervical adenopathy.  Neurological: She is alert.  Nonverbal during visit.   Skin: Skin is warm and dry. No rash noted. No erythema. No pallor.  Left heel unstagable ulcer. Heel protectors in place.   Psychiatric: She has a normal mood and affect.    Labs reviewed:  Recent Labs  12/13/15 01/03/16 06/15/16  NA 137 144 140  K 4.6 4.1 4.1  BUN 27* 39* 21  CREATININE 1.1 1.5* 1.2*    Recent Labs  12/13/15 01/03/16 06/15/16  AST 33 35 24  ALT 17 14 12   ALKPHOS 79 83 83    Recent Labs  10/27/15 12/13/15 06/15/16  WBC 3.3 5.2 6.5  HGB 11.1* 10.4*  11.0*  HCT 37 32* 35*  PLT 142* 186 215   Lab Results  Component Value Date   TSH 1.34 06/15/2016   Lab Results  Component Value Date   HGBA1C 5.2 12/29/2015   Lab Results  Component Value Date   CHOL 229 (A) 06/15/2016   HDL 56 06/15/2016   LDLCALC 158 06/15/2016   TRIG 72 06/15/2016   CHOLHDL 3.6 02/05/2012   Assessment/Plan 1. Chronic atrial fibrillation (HCC) Right chest Pace maker. On Coumadin today's INR 5.7 coumadin held recheck 09/12/2016. Patient's daughter desires to continue with coumadin whenever patient can take it.   2. Chronic systolic heart failure (HCC) No recent abrupt weight gain. Exam findings negative for edema, wheezing, shortness of breath or rales. Continue to monitor.   3. Hypertensive heart disease with heart failure (HCC) Losartan recently discontinued by Hospice service.   4. Chronic obstructive pulmonary disease, unspecified COPD type (HCC) Stable. Continue oxygen via    5. Constipation, slow transit Continue PRN suppository. Has had decreased oral intake.   6. Decreased oral intake Continue to monitor. Continue on Hospice services.     Family/ staff Communication: Reviewed plan of care with patient's daughter and facility Nurse supervisor.   Labs/tests ordered: None

## 2016-09-12 ENCOUNTER — Encounter: Payer: Self-pay | Admitting: Internal Medicine

## 2016-09-12 ENCOUNTER — Non-Acute Institutional Stay (SKILLED_NURSING_FACILITY): Payer: Medicare Other | Admitting: Internal Medicine

## 2016-09-12 DIAGNOSIS — I482 Chronic atrial fibrillation, unspecified: Secondary | ICD-10-CM

## 2016-09-12 DIAGNOSIS — K117 Disturbances of salivary secretion: Secondary | ICD-10-CM

## 2016-09-12 DIAGNOSIS — E870 Hyperosmolality and hypernatremia: Secondary | ICD-10-CM

## 2016-09-12 DIAGNOSIS — N179 Acute kidney failure, unspecified: Secondary | ICD-10-CM | POA: Diagnosis not present

## 2016-09-12 DIAGNOSIS — Z8673 Personal history of transient ischemic attack (TIA), and cerebral infarction without residual deficits: Secondary | ICD-10-CM | POA: Diagnosis not present

## 2016-09-12 DIAGNOSIS — Z952 Presence of prosthetic heart valve: Secondary | ICD-10-CM | POA: Diagnosis not present

## 2016-09-12 DIAGNOSIS — R791 Abnormal coagulation profile: Secondary | ICD-10-CM | POA: Diagnosis not present

## 2016-09-12 NOTE — Progress Notes (Signed)
Patient ID: Savannah LyonsMary L Kobrin, female   DOB: Mar 08, 1923, 82 y.o.   MRN: [id number]      [facility]  Code Status: Full Code    Chief Complaint  Patient presents with  . Acute Visit    Ongoing decline    Allergies  Allergen Reactions  . Advil [Ibuprofen] Other (See Comments)    "think it just doesn't work for her"  . Alprazolam Other (See Comments)    "don't remember what happens"  . Novocain [Procaine]   . Relafen [Nabumetone] Other (See Comments)    "don't remember what happens"  . Sulfa Antibiotics Other (See Comments)    "don't remember what happens"  . Toprol Xl [Metoprolol Succinate] Other (See Comments)    25mg ; "don't remember what happens"   Advanced Directives 11/02/2015  Does Patient Have a Medical Advance Directive? Yes  Type of Estate agentAdvance Directive Healthcare Power of [place];Living will  Does patient want to make changes to medical advance directive? No - Patient declined  Copy of Healthcare Power of Attorney in Chart? Yes  Pre-existing out of facility DNR order (yellow form or pink MOST form) -   Extended Emergency Contact Information Primary Emergency Contact: Chapman,Yazmina Address: [address]           [place], [place] [address] [place] Home Phone: [phone] Work Phone: [phone] Mobile Phone: [phone] Relation: Daughter    HPI 82 y/o female patient is seen for acute visit. Per staff, she has not been taking anything by mouth. Po intake has declined. She has poor urine output. She is on coumadin and inr today is 7.2. She had a bowel movement this am with some blood in the stool. She is asleep in bed and in no distress. Her daughter, son and grand-daughter are at bedside.   Review of Systems: unable to participate with dementia. She is under total care  Past Medical History:  Diagnosis Date  . A-fib (HCC) 01/04/2013  . Anemia    "iron transfusions"  . Angina 12/22/2003  . Anxiety   . Arthritis   . Asthma     . Atrial fibrillation (HCC)   . Autoimmune hepatitis (HCC) 07/12/1999  . Avascular necrosis of femoral head (HCC)    right  . Bacterial pneumonia 11/06/2008; 10/23/2009; 03/30/2010  . Bilateral renal cysts    "2 large on right off the lower pole"  . Blood transfusion   . CHF (congestive heart failure) (HCC) 05/27/2006; 05/30/2006; 11/06/2008  . Chronic renal insufficiency   . COPD (chronic obstructive pulmonary disease) (HCC)   . Coronary artery disease   . Depression   . Dysphagia as late effect of stroke   . GERD (gastroesophageal reflux disease)   . H/O cardiomegaly    diastolic  . H/O chronic bronchitis   . H/O hiatal hernia   . H/O: rheumatic fever    "childhood"  . History of lower GI bleeding    "multiple; required blood transfusions"  . Hx of atrioventricular node ablation 09/27/2014  . Hyperlipidemia   . Hypertension   . Hypothyroidism   . Left atrial enlargement   . Pacemaker   . Pleural effusion    "moderate right & small left"  . Pulmonary hypertension   . Shortness of breath   . Stroke (HCC) 03/06/2001; 05/30/2009   "light stroke"  . Stroke (HCC) 06/08/2009   "probable small TIA"  . Stroke (HCC) 01/06/2002  . Vascular dementia    "due to CVA 06/08/2009"  .  Ventilator dependent (HCC) 06/08/2009   respiratory failure   Past Surgical History:  Procedure Laterality Date  . ABDOMINAL HYSTERECTOMY  1960's  . AV NODE ABLATION  "06/06/2005"  . CARDIAC CATHETERIZATION  06/14/1999; 12/26/2003; 05/30/2009  . CARDIAC VALVE REPLACEMENT  08/20/1999   "mitral valve replacement; St. Jude"  . CARDIAC VALVE REPLACEMENT  06/05/2009    "redo sternotomy & aortic valve replacement; tissue valve"  . cauterized cecal av malformation    . dental implants removed  03/04/2001   lower  . electroencephalogram  01/28/2002; 11/26/2005; 10/23/2009  . INSERT / REPLACE / REMOVE PACEMAKER  09/11/2000  . KNEE ARTHROSCOPY  12/26/2000   left  . KNEE ARTHROSCOPY  07/29/2001   right  . PACEMAKER  GENERATOR CHANGE N/A 05/24/2013   Procedure: PACEMAKER GENERATOR CHANGE;  Surgeon: Thurmon FairMihai Croitoru, MD;  Location: [facility] CATH LAB;  Service: Cardiovascular;  Laterality: N/A;  . PERCUTANEOUS TRACHEOSTOMY  06/15/2009  . transcranial doppler & carotid ultrasound  11/28/2005    Medication reviewed.    Medication List       Accurate as of 09/12/16  3:55 PM. Always use your most recent med list.          acetaminophen 650 MG suppository Commonly known as:  TYLENOL Place 650 mg rectally 2 (two) times daily.   aspirin 81 MG tablet Take 81 mg by mouth daily.   atropine 1 % ophthalmic solution Place 1 drop into both eyes every 4 (four) hours as needed.   bisacodyl 10 MG suppository Commonly known as:  DULCOLAX Place 10 mg rectally. Give every 2 days, 2 hours after tylenol suppository   LORazepam 2 MG/ML concentrated solution Commonly known as:  ATIVAN Take 0.5 mg by mouth every 6 (six) hours as needed for anxiety or seizure.   OXYGEN Inhale 2 L into the lungs.   warfarin 6 MG tablet Commonly known as:  COUMADIN Take 6 mg by mouth daily. Monday, Wednesday and Friday   WHITE PETROLATUM-MINERAL OIL OP Apply 1 application to eye 2 (two) times daily. Wait 3-5 minutes between 2 eye medications      Immunization History  Administered Date(s) Administered  . Influenza Split 07/19/2014, 08/14/2014  . Influenza-Unspecified 08/03/2015  . PPD Test 12/20/2012, 07/01/2014, 06/21/2015  . Pneumococcal-Unspecified 07/19/2011    Physical exam BP 104/60   Pulse 78   Temp 97.9 F (36.6 C) (Oral)   Resp 20   Ht 5' (1.524 m)   Wt 114 lb (51.7 kg)   SpO2 98%   BMI 22.26 kg/m   Wt Readings from Last 3 Encounters:  09/12/16 114 lb (51.7 kg)  09/11/16 116 lb (52.6 kg)  07/24/16 127 lb 12.8 oz (58 kg)   Constitutional: Thin, frail, elderly female in no distress Head- atraumatic, normocephalic Cardiovascular- normal s1,s2, + systolic ejection murmur, pacemaker to right upper chest, scar  from previous sternotomy.  Respiratory- bilateral clear to auscultation, no wheeze, no rhonchi, no crackles, on o2 by nasal canula Abdomen- bowel sounds present, soft, non tender Musculoskeletal- left sided hemiparesis, left hand contracture, temporal muscle wasting, left arm brace in place Skin- warm and dry, has heel floaters   Labs reviewed: CBC Latest Ref Rng & Units 06/15/2016 12/13/2015 10/27/2015  WBC 10:3/mL 6.5 5.2 3.3  Hemoglobin 12.0 - 16.0 g/dL 11.0(A) 10.4(A) 11.1(A)  Hematocrit 36 - 46 % 35(A) 32(A) 37  Platelets 150 - 399 K/L 215 186 142(A)   CMP Latest Ref Rng & Units 09/07/2016 06/15/2016 01/03/2016  Glucose 65 -  99 mg/dL - - -  BUN 4 - 21 mg/dL 914(N) 21 82(N)  Creatinine 0.5 - 1.1 mg/dL 4.7(A) 1.2(A) 1.5(A)  Sodium 137 - 147 mmol/L 163(A) 140 144  Potassium 3.4 - 5.3 mmol/L 3.9 4.1 4.1  Chloride 98 - 107 mmol/L - - -  CO2 21 - 32 mmol/L - - -  Calcium 8.5 - 10.1 mg/dL - - -  Total Protein 6.0 - 8.3 g/dL - - -  Total Bilirubin 0.3 - 1.2 mg/dL - - -  Alkaline Phos 25 - 125 U/L - 83 83  AST 13 - 35 U/L - 24 35  ALT 7 - 35 U/L - 12 14   Lipid Panel     Component Value Date/Time   CHOL 229 (A) 06/15/2016   TRIG 72 06/15/2016   HDL 56 06/15/2016   CHOLHDL 3.6 02/05/2012 0550   VLDL 16 02/05/2012 0550   LDLCALC 158 06/15/2016   Lab Results  Component Value Date   TSH 1.34 06/15/2016   Lab Results  Component Value Date   HGBA1C 5.2 12/29/2015    Assessment/Plan  supratherapeutic inr inr today 7.2. Patient is not taking anything by mouth. Give vitamin k 5 mg sq x 1 now. Family would like INR checked in am  afib Rate controlled. Discontinue coumadin after reviewing care plan with family.   S/p AVR placement Discontinue her coumadin and monitor.   History of CVA Discontinue aspirin. Supportive care  Oral secretion Continue atropine drop on need basis and monitor  Hypernatremia With severe dehydration, not taking anything by mouth and currently is  under hospice care for end of life  Acute on chronic renal failure With pre-renal impairment from her severe hypovolemia worsening it. has poor urine output. Continue to provide comfort care  Reviewed care plan with family. Will fill out FMLA paperwork for patient's daughter.    Oneal Grout, MD Internal Medicine [facility] [address] [name], [place] [address] Cell Phone (Monday-Friday 8 am - 5 pm): [phone] On Call: [phone] and follow prompts after 5 pm and on weekends Office Phone: [phone] Office Fax: [phone]

## 2016-10-01 ENCOUNTER — Telehealth: Payer: Self-pay | Admitting: Cardiovascular Disease

## 2016-10-01 ENCOUNTER — Telehealth: Payer: Self-pay | Admitting: Neurology

## 2016-10-01 NOTE — Telephone Encounter (Signed)
Patient's daughter is calling to advise the patient passed away on 10/11/2016. She wants to thank you for all you did for her Mom.

## 2016-10-01 NOTE — Telephone Encounter (Signed)
New message  Pt daughter call to inform providers of her mothers passing and wanted to thank Dr. Tresa EndoKelly and Dr. Royann Shiversroitoru for the awesome care in which both providers offered to her mother. Per pt daughter; sends her deepest thank you!

## 2016-10-02 NOTE — Telephone Encounter (Signed)
Sorry to hear of their loss. Thank you for letting ne know. MCr

## 2016-10-14 DEATH — deceased

## 2016-10-15 NOTE — Telephone Encounter (Signed)
Tank you for letting me know.  She was a special person.

## 2017-01-15 ENCOUNTER — Ambulatory Visit: Payer: Self-pay | Admitting: Nurse Practitioner
